# Patient Record
Sex: Female | Born: 1955
Health system: Southern US, Community
[De-identification: ages and names within clinical notes are randomized; demographics above are authoritative.]

## PROBLEM LIST (undated history)

## (undated) DIAGNOSIS — E7800 Pure hypercholesterolemia, unspecified: Secondary | ICD-10-CM

## (undated) DIAGNOSIS — Z923 Personal history of irradiation: Secondary | ICD-10-CM

## (undated) DIAGNOSIS — R112 Nausea with vomiting, unspecified: Secondary | ICD-10-CM

## (undated) DIAGNOSIS — F419 Anxiety disorder, unspecified: Secondary | ICD-10-CM

## (undated) DIAGNOSIS — Z803 Family history of malignant neoplasm of breast: Secondary | ICD-10-CM

## (undated) DIAGNOSIS — Z9889 Other specified postprocedural states: Secondary | ICD-10-CM

## (undated) DIAGNOSIS — C50919 Malignant neoplasm of unspecified site of unspecified female breast: Secondary | ICD-10-CM

## (undated) DIAGNOSIS — Z800 Family history of malignant neoplasm of digestive organs: Secondary | ICD-10-CM

## (undated) DIAGNOSIS — Z789 Other specified health status: Secondary | ICD-10-CM

## (undated) HISTORY — DX: Family history of malignant neoplasm of digestive organs: Z80.0

## (undated) HISTORY — DX: Pure hypercholesterolemia, unspecified: E78.00

## (undated) HISTORY — PX: TONSILLECTOMY: SUR1361

## (undated) HISTORY — DX: Malignant neoplasm of unspecified site of unspecified female breast: C50.919

## (undated) HISTORY — PX: COLONOSCOPY: SHX174

## (undated) HISTORY — DX: Family history of malignant neoplasm of breast: Z80.3

---

## 1999-06-13 ENCOUNTER — Other Ambulatory Visit: Admission: RE | Admit: 1999-06-13 | Discharge: 1999-06-13 | Payer: Self-pay | Admitting: Gynecology

## 2000-06-28 ENCOUNTER — Encounter: Payer: Self-pay | Admitting: Gynecology

## 2000-06-28 ENCOUNTER — Encounter: Admission: RE | Admit: 2000-06-28 | Discharge: 2000-06-28 | Payer: Self-pay | Admitting: Gynecology

## 2000-07-01 ENCOUNTER — Encounter: Admission: RE | Admit: 2000-07-01 | Discharge: 2000-07-01 | Payer: Self-pay | Admitting: Gynecology

## 2000-07-01 ENCOUNTER — Encounter: Payer: Self-pay | Admitting: Gynecology

## 2001-06-30 ENCOUNTER — Other Ambulatory Visit: Admission: RE | Admit: 2001-06-30 | Discharge: 2001-06-30 | Payer: Self-pay | Admitting: Gynecology

## 2001-07-02 ENCOUNTER — Encounter: Payer: Self-pay | Admitting: Gynecology

## 2001-07-02 ENCOUNTER — Encounter: Admission: RE | Admit: 2001-07-02 | Discharge: 2001-07-02 | Payer: Self-pay | Admitting: Gynecology

## 2002-07-02 ENCOUNTER — Other Ambulatory Visit: Admission: RE | Admit: 2002-07-02 | Discharge: 2002-07-02 | Payer: Self-pay | Admitting: Gynecology

## 2002-07-08 ENCOUNTER — Encounter: Payer: Self-pay | Admitting: Gynecology

## 2002-07-08 ENCOUNTER — Encounter: Admission: RE | Admit: 2002-07-08 | Discharge: 2002-07-08 | Payer: Self-pay | Admitting: Gynecology

## 2003-10-11 ENCOUNTER — Other Ambulatory Visit: Admission: RE | Admit: 2003-10-11 | Discharge: 2003-10-11 | Payer: Self-pay | Admitting: Gynecology

## 2004-11-14 ENCOUNTER — Other Ambulatory Visit: Admission: RE | Admit: 2004-11-14 | Discharge: 2004-11-14 | Payer: Self-pay | Admitting: Gynecology

## 2004-12-12 ENCOUNTER — Encounter: Admission: RE | Admit: 2004-12-12 | Discharge: 2004-12-12 | Payer: Self-pay | Admitting: Gynecology

## 2005-12-21 ENCOUNTER — Ambulatory Visit (HOSPITAL_COMMUNITY): Admission: RE | Admit: 2005-12-21 | Discharge: 2005-12-21 | Payer: Self-pay | Admitting: Gynecology

## 2005-12-31 ENCOUNTER — Other Ambulatory Visit: Admission: RE | Admit: 2005-12-31 | Discharge: 2005-12-31 | Payer: Self-pay | Admitting: Gynecology

## 2006-12-31 ENCOUNTER — Ambulatory Visit (HOSPITAL_COMMUNITY): Admission: RE | Admit: 2006-12-31 | Discharge: 2006-12-31 | Payer: Self-pay | Admitting: Gynecology

## 2007-01-06 ENCOUNTER — Other Ambulatory Visit: Admission: RE | Admit: 2007-01-06 | Discharge: 2007-01-06 | Payer: Self-pay | Admitting: Gynecology

## 2008-04-15 ENCOUNTER — Ambulatory Visit (HOSPITAL_COMMUNITY): Admission: RE | Admit: 2008-04-15 | Discharge: 2008-04-15 | Payer: Self-pay | Admitting: Gynecology

## 2009-05-05 ENCOUNTER — Ambulatory Visit (HOSPITAL_COMMUNITY): Admission: RE | Admit: 2009-05-05 | Discharge: 2009-05-05 | Payer: Self-pay | Admitting: Gynecology

## 2010-05-29 ENCOUNTER — Ambulatory Visit (HOSPITAL_COMMUNITY): Admission: RE | Admit: 2010-05-29 | Discharge: 2010-05-29 | Payer: Self-pay | Admitting: Gynecology

## 2011-01-14 ENCOUNTER — Encounter: Payer: Self-pay | Admitting: Gynecology

## 2011-08-16 ENCOUNTER — Other Ambulatory Visit (HOSPITAL_COMMUNITY): Payer: Self-pay | Admitting: Gynecology

## 2011-08-16 DIAGNOSIS — Z1231 Encounter for screening mammogram for malignant neoplasm of breast: Secondary | ICD-10-CM

## 2011-08-28 ENCOUNTER — Ambulatory Visit (HOSPITAL_COMMUNITY)
Admission: RE | Admit: 2011-08-28 | Discharge: 2011-08-28 | Disposition: A | Discharge status: Home / Self Care | Payer: 59 | Source: Ambulatory Visit | Attending: Gynecology | Admitting: Gynecology

## 2011-08-28 DIAGNOSIS — Z1231 Encounter for screening mammogram for malignant neoplasm of breast: Secondary | ICD-10-CM

## 2012-11-19 ENCOUNTER — Other Ambulatory Visit (HOSPITAL_COMMUNITY): Payer: Self-pay | Admitting: Gynecology

## 2012-11-19 DIAGNOSIS — Z1231 Encounter for screening mammogram for malignant neoplasm of breast: Secondary | ICD-10-CM

## 2012-11-24 ENCOUNTER — Ambulatory Visit (HOSPITAL_COMMUNITY)
Admission: RE | Admit: 2012-11-24 | Discharge: 2012-11-24 | Disposition: A | Discharge status: Home / Self Care | Payer: 59 | Source: Ambulatory Visit | Attending: Gynecology | Admitting: Gynecology

## 2012-11-24 DIAGNOSIS — Z1231 Encounter for screening mammogram for malignant neoplasm of breast: Secondary | ICD-10-CM | POA: Insufficient documentation

## 2012-11-28 ENCOUNTER — Other Ambulatory Visit: Payer: Self-pay | Admitting: Gynecology

## 2012-11-28 DIAGNOSIS — R928 Other abnormal and inconclusive findings on diagnostic imaging of breast: Secondary | ICD-10-CM

## 2012-12-15 ENCOUNTER — Ambulatory Visit
Admission: RE | Admit: 2012-12-15 | Discharge: 2012-12-15 | Disposition: A | Discharge status: Home / Self Care | Payer: 59 | Source: Ambulatory Visit | Attending: Gynecology | Admitting: Gynecology

## 2012-12-15 ENCOUNTER — Other Ambulatory Visit: Payer: Self-pay | Admitting: Gynecology

## 2012-12-15 DIAGNOSIS — R928 Other abnormal and inconclusive findings on diagnostic imaging of breast: Secondary | ICD-10-CM

## 2013-08-10 ENCOUNTER — Other Ambulatory Visit (HOSPITAL_COMMUNITY): Payer: 59

## 2013-08-11 ENCOUNTER — Encounter (HOSPITAL_COMMUNITY)
Admission: RE | Admit: 2013-08-11 | Discharge: 2013-08-11 | Disposition: A | Discharge status: Home / Self Care | Payer: BC Managed Care – PPO | Source: Ambulatory Visit | Attending: Obstetrics and Gynecology | Admitting: Obstetrics and Gynecology

## 2013-08-11 ENCOUNTER — Encounter (HOSPITAL_COMMUNITY): Payer: Self-pay

## 2013-08-11 HISTORY — DX: Other specified health status: Z78.9

## 2013-08-11 LAB — CBC
HCT: 39.8 % (ref 36.0–46.0)
Hemoglobin: 13.9 g/dL (ref 12.0–15.0)
MCHC: 34.9 g/dL (ref 30.0–36.0)
MCV: 90 fL (ref 78.0–100.0)
WBC: 8.8 10*3/uL (ref 4.0–10.5)

## 2013-08-11 NOTE — Patient Instructions (Signed)
Your procedure is scheduled on:08/14/13  Enter through the Main Entrance at :6am  Pick up desk phone and dial [phone] and inform us of your arrival.  Please call [phone] if you have any problems the morning of surgery.  Remember: Do not eat food or drink liquids, including water, after midnight:Thursday   You may brush your teeth the morning of surgery.   DO NOT wear jewelry, eye make-up, lipstick,body lotion, or dark fingernail polish.  (Polished toes are ok) You may wear deodorant.  If you are to be admitted after surgery, leave suitcase in car until your room has been assigned. Patients discharged on the day of surgery will not be allowed to drive home. Wear loose fitting, comfortable clothes for your ride home.

## 2013-08-13 NOTE — H&P (Signed)
Judith Mcneil is an 57 y.o. female who presents for a scheduled hysteroscopic resection of endometrial polyps noted on a recent w/u for postmenopausal bleeding.  The pt had been several years amenorrheic on a regimen of prometrium and climara with estring when she had both an episode of spotting and a full period-like bleed .  EMB was negative but a SIUS showed two polyps measuring 2.5cm and 1 cm.    Pertinent Gynecological History: Menstrual History:  No LMP recorded. Patient is postmenopausal.    Past Medical History  Diagnosis Date  . Medical history non-contributory     Past Surgical History  Procedure Laterality Date  . Tonsillectomy      age 66  . Colonoscopy      x 2    No family history on file.  Social History:  reports that she has never smoked. She does not have any smokeless tobacco history on file. She reports that she drinks about 0.6 ounces of alcohol per week. She reports that she does not use illicit drugs.  Allergies: No Known Allergies  No prescriptions prior to admission    ROS  There were no vitals taken for this visit. Physical Exam  Constitutional: She is oriented to person, place, and time. She appears well-developed and well-nourished.  Cardiovascular: Normal rate and regular rhythm.   Respiratory: Breath sounds normal.  GI: Soft.  Genitourinary: Vagina normal and uterus normal.  Neurological: She is alert and oriented to person, place, and time.  Psychiatric: She has a normal mood and affect.     Assessment/Plan: The pt was counseled on risks and benefits of the procedure including bleeding and possible uterine perforation.  She will pretreat with cytotec 3 hours prior to surgery to aid with cervical dilation.  We reviewed the use of truclear in detail and she is ready to proceed.  Oliver Pila 08/13/2013, 6:16 PM

## 2013-08-14 ENCOUNTER — Encounter (HOSPITAL_COMMUNITY)
Admission: RE | Disposition: A | Discharge status: Home / Self Care | Payer: Self-pay | Source: Ambulatory Visit | Attending: Obstetrics and Gynecology

## 2013-08-14 ENCOUNTER — Ambulatory Visit (HOSPITAL_COMMUNITY): Payer: BC Managed Care – PPO | Admitting: Certified Registered"

## 2013-08-14 ENCOUNTER — Encounter (HOSPITAL_COMMUNITY): Payer: Self-pay | Admitting: Certified Registered"

## 2013-08-14 ENCOUNTER — Encounter (HOSPITAL_COMMUNITY): Payer: Self-pay | Admitting: *Deleted

## 2013-08-14 ENCOUNTER — Ambulatory Visit (HOSPITAL_COMMUNITY)
Admission: RE | Admit: 2013-08-14 | Discharge: 2013-08-14 | Disposition: A | Discharge status: Home / Self Care | Payer: BC Managed Care – PPO | Source: Ambulatory Visit | Attending: Obstetrics and Gynecology | Admitting: Obstetrics and Gynecology

## 2013-08-14 DIAGNOSIS — N950 Postmenopausal bleeding: Secondary | ICD-10-CM | POA: Insufficient documentation

## 2013-08-14 DIAGNOSIS — N840 Polyp of corpus uteri: Secondary | ICD-10-CM | POA: Insufficient documentation

## 2013-08-14 HISTORY — PX: DILATATION & CURETTAGE/HYSTEROSCOPY WITH TRUECLEAR: SHX6353

## 2013-08-14 SURGERY — DILATATION & CURETTAGE/HYSTEROSCOPY WITH TRUCLEAR
Anesthesia: General | Site: Vagina | Wound class: Clean Contaminated

## 2013-08-14 MED ORDER — MEPERIDINE HCL 25 MG/ML IJ SOLN: 6.2500 mg | INTRAMUSCULAR | Status: DC | PRN

## 2013-08-14 MED ORDER — PROPOFOL 10 MG/ML IV BOLUS
INTRAVENOUS | Status: DC | PRN
  Administered 2013-08-14: 150 mg via INTRAVENOUS

## 2013-08-14 MED ORDER — LIDOCAINE HCL (CARDIAC) 20 MG/ML IV SOLN
INTRAVENOUS | Status: DC | PRN
  Administered 2013-08-14: 80 mg via INTRAVENOUS

## 2013-08-14 MED ORDER — FENTANYL CITRATE 0.05 MG/ML IJ SOLN
INTRAMUSCULAR | Status: AC
  Filled 2013-08-14: qty 5

## 2013-08-14 MED ORDER — MIDAZOLAM HCL 5 MG/5ML IJ SOLN
INTRAMUSCULAR | Status: DC | PRN
  Administered 2013-08-14: 2 mg via INTRAVENOUS

## 2013-08-14 MED ORDER — KETOROLAC TROMETHAMINE 30 MG/ML IJ SOLN
INTRAMUSCULAR | Status: DC | PRN
  Administered 2013-08-14: 30 mg via INTRAVENOUS

## 2013-08-14 MED ORDER — LACTATED RINGERS IV SOLN
INTRAVENOUS | Status: DC
  Administered 2013-08-14 (×2): via INTRAVENOUS

## 2013-08-14 MED ORDER — ONDANSETRON HCL 4 MG/2ML IJ SOLN
INTRAMUSCULAR | Status: DC | PRN
  Administered 2013-08-14: 4 mg via INTRAVENOUS

## 2013-08-14 MED ORDER — SODIUM CHLORIDE 0.9 % IR SOLN
Status: DC | PRN
  Administered 2013-08-14: 6000 mL

## 2013-08-14 MED ORDER — LIDOCAINE HCL 1 % IJ SOLN
INTRAMUSCULAR | Status: DC | PRN
  Administered 2013-08-14: 20 mL

## 2013-08-14 MED ORDER — LIDOCAINE HCL (CARDIAC) 20 MG/ML IV SOLN
INTRAVENOUS | Status: AC
Start: 2013-08-14 — End: 2013-08-14
  Filled 2013-08-14: qty 5

## 2013-08-14 MED ORDER — PROPOFOL 10 MG/ML IV EMUL
INTRAVENOUS | Status: AC
  Filled 2013-08-14: qty 20

## 2013-08-14 MED ORDER — MIDAZOLAM HCL 2 MG/2ML IJ SOLN: 0.5000 mg | Freq: Once | INTRAMUSCULAR | Status: DC | PRN

## 2013-08-14 MED ORDER — KETOROLAC TROMETHAMINE 30 MG/ML IJ SOLN: 15.0000 mg | Freq: Once | INTRAMUSCULAR | Status: DC | PRN

## 2013-08-14 MED ORDER — LACTATED RINGERS IV SOLN: INTRAVENOUS | Status: DC

## 2013-08-14 MED ORDER — DEXAMETHASONE SODIUM PHOSPHATE 10 MG/ML IJ SOLN
INTRAMUSCULAR | Status: AC
  Filled 2013-08-14: qty 1

## 2013-08-14 MED ORDER — PROMETHAZINE HCL 25 MG/ML IJ SOLN: 6.2500 mg | INTRAMUSCULAR | Status: DC | PRN

## 2013-08-14 MED ORDER — ONDANSETRON HCL 4 MG/2ML IJ SOLN
INTRAMUSCULAR | Status: AC
  Filled 2013-08-14: qty 2

## 2013-08-14 MED ORDER — MIDAZOLAM HCL 2 MG/2ML IJ SOLN
INTRAMUSCULAR | Status: AC
  Filled 2013-08-14: qty 2

## 2013-08-14 MED ORDER — FENTANYL CITRATE 0.05 MG/ML IJ SOLN: 25.0000 ug | INTRAMUSCULAR | Status: DC | PRN

## 2013-08-14 MED ORDER — FENTANYL CITRATE 0.05 MG/ML IJ SOLN
INTRAMUSCULAR | Status: DC | PRN
  Administered 2013-08-14 (×2): 50 ug via INTRAVENOUS

## 2013-08-14 MED ORDER — DEXAMETHASONE SODIUM PHOSPHATE 10 MG/ML IJ SOLN
INTRAMUSCULAR | Status: DC | PRN
  Administered 2013-08-14: 10 mg via INTRAVENOUS

## 2013-08-14 SURGICAL SUPPLY — 23 items
BLADE INCISOR TRUC PLUS 2.9 (ABLATOR) IMPLANT
CANISTERS HI-FLOW 3000CC (CANNISTER) ×3 IMPLANT
CATH ROBINSON RED A/P 16FR (CATHETERS) ×2 IMPLANT
CLOTH BEACON ORANGE TIMEOUT ST (SAFETY) ×2 IMPLANT
CONTAINER PREFILL 10% NBF 60ML (FORM) ×4 IMPLANT
DRAPE HYSTEROSCOPY (DRAPE) ×2 IMPLANT
DRESSING TELFA 8X3 (GAUZE/BANDAGES/DRESSINGS) ×2 IMPLANT
ELECT REM PT RETURN 9FT ADLT (ELECTROSURGICAL)
ELECTRODE REM PT RTRN 9FT ADLT (ELECTROSURGICAL) IMPLANT
GLOVE BIO SURGEON STRL SZ 6.5 (GLOVE) ×2 IMPLANT
GLOVE BIOGEL PI IND STRL 6.5 (GLOVE) ×1 IMPLANT
GLOVE BIOGEL PI INDICATOR 6.5 (GLOVE) ×1
GOWN STRL REIN XL XLG (GOWN DISPOSABLE) ×4 IMPLANT
INCISOR TRUC PLUS BLADE 2.9 (ABLATOR) ×2
KIT HYSTEROSCOPY TRUCLEAR (ABLATOR) ×1 IMPLANT
MORCELLATOR RECIP TRUCLEAR 4.0 (ABLATOR) IMPLANT
NDL SPNL 22GX3.5 QUINCKE BK (NEEDLE) ×1 IMPLANT
NEEDLE SPNL 22GX3.5 QUINCKE BK (NEEDLE) ×2 IMPLANT
PACK VAGINAL MINOR WOMEN LF (CUSTOM PROCEDURE TRAY) ×2 IMPLANT
PAD OB MATERNITY 4.3X12.25 (PERSONAL CARE ITEMS) ×2 IMPLANT
SYR CONTROL 10ML LL (SYRINGE) ×2 IMPLANT
TOWEL OR 17X24 6PK STRL BLUE (TOWEL DISPOSABLE) ×4 IMPLANT
WATER STERILE IRR 1000ML POUR (IV SOLUTION) ×2 IMPLANT

## 2013-08-14 NOTE — Anesthesia Postprocedure Evaluation (Signed)
Anesthesia Post Note  Patient: Judith Mcneil  Procedure(s) Performed: Procedure(s) (LRB): DILATATION & CURETTAGE/HYSTEROSCOPY WITH TRUECLEAR (N/A)  Anesthesia type: General  Patient location: PACU  Post pain: Pain level controlled  Post assessment: Post-op Vital signs reviewed  Last Vitals:  Filed Vitals:   08/14/13 0858  BP: 122/74  Pulse: 91  Temp: 36.4 C  Resp:     Post vital signs: Reviewed  Level of consciousness: sedated  Complications: No apparent anesthesia complications

## 2013-08-14 NOTE — Brief Op Note (Signed)
08/14/2013  8:23 AM  PATIENT:  Judith Mcneil  57 y.o. female  PRE-OPERATIVE DIAGNOSIS:  postmenopausal bleeding, [address]  POST-OPERATIVE DIAGNOSIS:  postmenopausal bleeding  PROCEDURE:  Procedure(s) with comments: DILATATION & CURETTAGE/HYSTEROSCOPY WITH TRUECLEAR (N/A) - 1 hr in the OR  SURGEON:  Surgeon(s) and Role:    * Oliver Pila, MD - Primary  PHYSICIAN ASSISTANT:   ASSISTANTS: none   ANESTHESIA:   local, MAC  EBL:  Total I/O In: 1000 [I.V.:1000] Out: -   BLOOD ADMINISTERED:none  DRAINS: none   LOCAL MEDICATIONS USED:  LIDOCAINE   SPECIMEN:  Endometrial currettings  DISPOSITION OF SPECIMEN:  PATHOLOGY  COUNTS:  YES  TOURNIQUET:  * No tourniquets in log *  DICTATION: .Dragon Dictation  PLAN OF CARE: Discharge to home after PACU  PATIENT DISPOSITION:  PACU - hemodynamically stable.

## 2013-08-14 NOTE — Preoperative (Signed)
Beta Blockers   Reason not to administer Beta Blockers:Not Applicable 

## 2013-08-14 NOTE — Progress Notes (Signed)
Patient ID: Judith Mcneil, female   DOB: 04-14-1956, 57 y.o.   MRN: [id number] Per pt no changes in dictated H&P.  Brief exam WNL.

## 2013-08-14 NOTE — Anesthesia Preprocedure Evaluation (Signed)
Anesthesia Evaluation  Patient identified by MRN, date of birth, ID band Patient awake    Reviewed: Allergy & Precautions, H&P , Patient's Chart, lab work & pertinent test results, reviewed documented beta blocker date and time   History of Anesthesia Complications Negative for: history of anesthetic complications  Airway Mallampati: II TM Distance: >3 FB Neck ROM: full    Dental no notable dental hx.    Pulmonary neg pulmonary ROS,  breath sounds clear to auscultation  Pulmonary exam normal       Cardiovascular Exercise Tolerance: Good negative cardio ROS  Rhythm:regular Rate:Normal     Neuro/Psych negative neurological ROS  negative psych ROS   GI/Hepatic negative GI ROS, Neg liver ROS,   Endo/Other  negative endocrine ROS  Renal/GU negative Renal ROS     Musculoskeletal   Abdominal   Peds  Hematology negative hematology ROS (+)   Anesthesia Other Findings   Reproductive/Obstetrics negative OB ROS                           Anesthesia Physical Anesthesia Plan  ASA: II  Anesthesia Plan: General LMA   Post-op Pain Management:    Induction:   Airway Management Planned:   Additional Equipment:   Intra-op Plan:   Post-operative Plan:   Informed Consent: I have reviewed the patients History and Physical, chart, labs and discussed the procedure including the risks, benefits and alternatives for the proposed anesthesia with the patient or authorized representative who has indicated his/her understanding and acceptance.   Dental Advisory Given  Plan Discussed with: CRNA, Surgeon and Anesthesiologist  Anesthesia Plan Comments:         Anesthesia Quick Evaluation

## 2013-08-14 NOTE — Transfer of Care (Signed)
Immediate Anesthesia Transfer of Care Note  Patient: Judith Mcneil  Procedure(s) Performed: Procedure(s) with comments: DILATATION & CURETTAGE/HYSTEROSCOPY WITH TRUECLEAR (N/A) - 1 hr in the OR  Patient Location: PACU  Anesthesia Type:General  Level of Consciousness: awake, alert  and oriented  Airway & Oxygen Therapy: Patient Spontanous Breathing and Patient connected to nasal cannula oxygen  Post-op Assessment: Report given to PACU RN, Post -op Vital signs reviewed and stable and Patient moving all extremities  Post vital signs: Reviewed and stable  Complications: No apparent anesthesia complications

## 2013-08-14 NOTE — Op Note (Signed)
Operative note  Preop diagnosis Postmenopausal bleeding Endometrial polyps  Postoperative diagnosis Same  Procedure Operative hysteroscopy with resection of endometrial polyps using the truclear  Surgeon Dr. Huel Cote  Anesthesia LMA and 1% paracervical block  Fluids Estimated blood loss less than 50 cc Urine output 50 cc cc straight catheter prior procedure IV fluids 1200 cc LR Hysteroscopic deficit 100 cc  Findings  The uterine cavity had 2 polyps a large 2 cm polyp extending off the posterior surface of the uterus as well as a smaller polyp in the fundal region measuring approximately 1 cm. The remainder of the cavity appeared normal.   Specimen  Endometrial polyp and curettings   Procedure  After informed consent was obtained patient was taken to the operating room where LMA anesthesia was obtained without difficulty. She was prepped and draped in the normal sterile fashion in the dorsal lithotomy position. An appropriate time out was performed. A speculum was placed within the vagina and the cervix was identified and the anterior lip of the cervix was injected with 1% lidocaine. This was then grasped with a single-tooth tenaculum and an additional 9 cc each was placed in the 2 and 10:00 position of the paracervical tissue. The cervix was easily sounded and dilated do to the patient's Cytotec use. The truclear smaller operating scope was then easily introduced into the uterine fundus and the findings as previously stated. The truclear blade was then inserted through the scope and the polyps removed under direct visualization and the tissue handed off to pathology. At this point the smaller scope was replaced with a larger true clear scope to obtain better uterine distention and all areas were closely inspected with no residual polyp noted.   At the conclusion of the procedure there were no further polyps in the uterine fundus however there did appear to be some lining  present so a gentle curettage was performed and this was likewise handed off to pathology. There is no active bleeding noted and all entrance cameras were removed from the vagina and the tenaculum removed from the cervix. The patient was then awakened and taken to the recovery room in good condition. All instruments and sponge counts were correct.

## 2013-08-17 ENCOUNTER — Encounter (HOSPITAL_COMMUNITY): Payer: Self-pay | Admitting: Obstetrics and Gynecology

## 2013-12-01 ENCOUNTER — Other Ambulatory Visit (HOSPITAL_COMMUNITY): Payer: Self-pay | Admitting: Obstetrics and Gynecology

## 2013-12-01 DIAGNOSIS — Z1231 Encounter for screening mammogram for malignant neoplasm of breast: Secondary | ICD-10-CM

## 2013-12-22 ENCOUNTER — Ambulatory Visit (HOSPITAL_COMMUNITY): Payer: BC Managed Care – PPO

## 2013-12-31 ENCOUNTER — Other Ambulatory Visit (HOSPITAL_COMMUNITY): Payer: Self-pay | Admitting: Obstetrics and Gynecology

## 2013-12-31 ENCOUNTER — Ambulatory Visit (HOSPITAL_COMMUNITY)
Admission: RE | Admit: 2013-12-31 | Discharge: 2013-12-31 | Disposition: A | Discharge status: Home / Self Care | Payer: BC Managed Care – PPO | Source: Ambulatory Visit | Attending: Obstetrics and Gynecology | Admitting: Obstetrics and Gynecology

## 2013-12-31 DIAGNOSIS — Z1231 Encounter for screening mammogram for malignant neoplasm of breast: Secondary | ICD-10-CM

## 2014-02-17 ENCOUNTER — Encounter (HOSPITAL_COMMUNITY): Payer: Self-pay | Admitting: Emergency Medicine

## 2014-02-17 ENCOUNTER — Emergency Department (HOSPITAL_COMMUNITY)
Admission: EM | Admit: 2014-02-17 | Discharge: 2014-02-17 | Disposition: A | Discharge status: Home / Self Care | Payer: Worker's Compensation | Attending: Emergency Medicine | Admitting: Emergency Medicine

## 2014-02-17 ENCOUNTER — Emergency Department (HOSPITAL_COMMUNITY): Payer: Worker's Compensation

## 2014-02-17 DIAGNOSIS — T148XXA Other injury of unspecified body region, initial encounter: Secondary | ICD-10-CM

## 2014-02-17 DIAGNOSIS — S6990XA Unspecified injury of unspecified wrist, hand and finger(s), initial encounter: Secondary | ICD-10-CM | POA: Insufficient documentation

## 2014-02-17 DIAGNOSIS — S59919A Unspecified injury of unspecified forearm, initial encounter: Secondary | ICD-10-CM | POA: Insufficient documentation

## 2014-02-17 DIAGNOSIS — S199XXA Unspecified injury of neck, initial encounter: Secondary | ICD-10-CM

## 2014-02-17 DIAGNOSIS — Z79899 Other long term (current) drug therapy: Secondary | ICD-10-CM | POA: Insufficient documentation

## 2014-02-17 DIAGNOSIS — IMO0002 Reserved for concepts with insufficient information to code with codable children: Secondary | ICD-10-CM | POA: Insufficient documentation

## 2014-02-17 DIAGNOSIS — S62609A Fracture of unspecified phalanx of unspecified finger, initial encounter for closed fracture: Secondary | ICD-10-CM

## 2014-02-17 DIAGNOSIS — S62639A Displaced fracture of distal phalanx of unspecified finger, initial encounter for closed fracture: Secondary | ICD-10-CM | POA: Insufficient documentation

## 2014-02-17 DIAGNOSIS — S59909A Unspecified injury of unspecified elbow, initial encounter: Secondary | ICD-10-CM | POA: Insufficient documentation

## 2014-02-17 DIAGNOSIS — Y9389 Activity, other specified: Secondary | ICD-10-CM | POA: Insufficient documentation

## 2014-02-17 DIAGNOSIS — S0993XA Unspecified injury of face, initial encounter: Secondary | ICD-10-CM | POA: Insufficient documentation

## 2014-02-17 DIAGNOSIS — Y92410 Unspecified street and highway as the place of occurrence of the external cause: Secondary | ICD-10-CM | POA: Insufficient documentation

## 2014-02-17 MED ORDER — OXYCODONE-ACETAMINOPHEN 5-325 MG PO TABS: 2.0000 | ORAL_TABLET | Freq: Four times a day (QID) | ORAL | Status: DC | PRN
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
## 2014-02-17 NOTE — ED Provider Notes (Signed)
Medical screening examination/treatment/procedure(s) were performed by non-physician practitioner and as supervising physician I was immediately available for consultation/collaboration.  EKG Interpretation   None         Houston Siren III, MD 02/17/14 [id number]
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
## 2017-12-24 HISTORY — PX: BREAST LUMPECTOMY: SHX2

## 2018-06-11 ENCOUNTER — Other Ambulatory Visit: Payer: Self-pay | Admitting: Obstetrics and Gynecology

## 2018-06-11 DIAGNOSIS — R928 Other abnormal and inconclusive findings on diagnostic imaging of breast: Secondary | ICD-10-CM

## 2018-06-16 ENCOUNTER — Ambulatory Visit
Admission: RE | Admit: 2018-06-16 | Discharge: 2018-06-16 | Disposition: A | Discharge status: Home / Self Care | Payer: BLUE CROSS/BLUE SHIELD | Source: Ambulatory Visit | Attending: Obstetrics and Gynecology | Admitting: Obstetrics and Gynecology

## 2018-06-16 ENCOUNTER — Other Ambulatory Visit: Payer: Self-pay | Admitting: Obstetrics and Gynecology

## 2018-06-16 DIAGNOSIS — R928 Other abnormal and inconclusive findings on diagnostic imaging of breast: Secondary | ICD-10-CM

## 2018-06-16 DIAGNOSIS — N6489 Other specified disorders of breast: Secondary | ICD-10-CM

## 2018-06-19 ENCOUNTER — Other Ambulatory Visit: Payer: Self-pay | Admitting: Obstetrics and Gynecology

## 2018-06-19 ENCOUNTER — Ambulatory Visit
Admission: RE | Admit: 2018-06-19 | Discharge: 2018-06-19 | Disposition: A | Discharge status: Home / Self Care | Payer: BLUE CROSS/BLUE SHIELD | Source: Ambulatory Visit | Attending: Obstetrics and Gynecology | Admitting: Obstetrics and Gynecology

## 2018-06-19 DIAGNOSIS — N6489 Other specified disorders of breast: Secondary | ICD-10-CM

## 2018-06-20 ENCOUNTER — Telehealth: Payer: Self-pay | Admitting: Hematology and Oncology

## 2018-06-20 ENCOUNTER — Encounter: Payer: Self-pay | Admitting: *Deleted

## 2018-06-20 NOTE — Telephone Encounter (Signed)
Spoke with patient to confirm afternoon appointment for 7/3, packet emailed to patient

## 2018-06-24 ENCOUNTER — Other Ambulatory Visit: Payer: Self-pay | Admitting: *Deleted

## 2018-06-24 DIAGNOSIS — Z170 Estrogen receptor positive status [ER+]: Secondary | ICD-10-CM | POA: Insufficient documentation

## 2018-06-24 DIAGNOSIS — C50412 Malignant neoplasm of upper-outer quadrant of left female breast: Secondary | ICD-10-CM

## 2018-06-25 ENCOUNTER — Encounter: Payer: Self-pay | Admitting: Physical Therapy

## 2018-06-25 ENCOUNTER — Inpatient Hospital Stay: Payer: BLUE CROSS/BLUE SHIELD | Attending: Hematology and Oncology | Admitting: Hematology and Oncology

## 2018-06-25 ENCOUNTER — Ambulatory Visit
Admission: RE | Admit: 2018-06-25 | Discharge: 2018-06-25 | Disposition: A | Discharge status: Home / Self Care | Payer: BLUE CROSS/BLUE SHIELD | Source: Ambulatory Visit | Attending: Radiation Oncology | Admitting: Radiation Oncology

## 2018-06-25 ENCOUNTER — Ambulatory Visit: Payer: Self-pay | Admitting: General Surgery

## 2018-06-25 ENCOUNTER — Ambulatory Visit: Payer: BLUE CROSS/BLUE SHIELD | Attending: General Surgery | Admitting: Physical Therapy

## 2018-06-25 ENCOUNTER — Other Ambulatory Visit: Payer: Self-pay | Admitting: *Deleted

## 2018-06-25 ENCOUNTER — Other Ambulatory Visit: Payer: Self-pay

## 2018-06-25 ENCOUNTER — Encounter: Payer: Self-pay | Admitting: Hematology and Oncology

## 2018-06-25 ENCOUNTER — Inpatient Hospital Stay: Payer: BLUE CROSS/BLUE SHIELD

## 2018-06-25 DIAGNOSIS — Z79899 Other long term (current) drug therapy: Secondary | ICD-10-CM | POA: Insufficient documentation

## 2018-06-25 DIAGNOSIS — Z803 Family history of malignant neoplasm of breast: Secondary | ICD-10-CM | POA: Diagnosis not present

## 2018-06-25 DIAGNOSIS — Z170 Estrogen receptor positive status [ER+]: Secondary | ICD-10-CM | POA: Insufficient documentation

## 2018-06-25 DIAGNOSIS — R293 Abnormal posture: Secondary | ICD-10-CM | POA: Insufficient documentation

## 2018-06-25 DIAGNOSIS — Z800 Family history of malignant neoplasm of digestive organs: Secondary | ICD-10-CM | POA: Diagnosis not present

## 2018-06-25 DIAGNOSIS — C50412 Malignant neoplasm of upper-outer quadrant of left female breast: Secondary | ICD-10-CM

## 2018-06-25 DIAGNOSIS — C50912 Malignant neoplasm of unspecified site of left female breast: Secondary | ICD-10-CM

## 2018-06-25 LAB — CBC WITH DIFFERENTIAL (CANCER CENTER ONLY)
Basophils Absolute: 0 10*3/uL (ref 0.0–0.1)
Basophils Relative: 1 %
Eosinophils Absolute: 0.1 10*3/uL (ref 0.0–0.5)
Eosinophils Relative: 1 %
HEMATOCRIT: 41.7 % (ref 34.8–46.6)
Hemoglobin: 13.9 g/dL (ref 11.6–15.9)
LYMPHS ABS: 1.9 10*3/uL (ref 0.9–3.3)
Lymphocytes Relative: 28 %
MCH: 30.9 pg (ref 25.1–34.0)
MCHC: 33.3 g/dL (ref 31.5–36.0)
MCV: 92.8 fL (ref 79.5–101.0)
Monocytes Absolute: 0.4 10*3/uL (ref 0.1–0.9)
Monocytes Relative: 6 %
NEUTROS PCT: 64 %
Neutro Abs: 4.4 10*3/uL (ref 1.5–6.5)
Platelet Count: 262 10*3/uL (ref 145–400)
RBC: 4.49 MIL/uL (ref 3.70–5.45)
RDW: 13.5 % (ref 11.2–14.5)
WBC: 6.9 10*3/uL (ref 3.9–10.3)

## 2018-06-25 LAB — CMP (CANCER CENTER ONLY)
ALT: 25 U/L (ref 0–44)
AST: 19 U/L (ref 15–41)
Albumin: 3.9 g/dL (ref 3.5–5.0)
Alkaline Phosphatase: 90 U/L (ref 38–126)
Anion gap: 5 (ref 5–15)
BUN: 20 mg/dL (ref 8–23)
CALCIUM: 9.2 mg/dL (ref 8.9–10.3)
CHLORIDE: 108 mmol/L (ref 98–111)
CO2: 26 mmol/L (ref 22–32)
Creatinine: 0.95 mg/dL (ref 0.44–1.00)
GFR, Estimated: >60 mL/min (ref >60)
Glucose, Bld: 94 mg/dL (ref 70–99)
Potassium: 4.4 mmol/L (ref 3.5–5.1)
Sodium: 139 mmol/L (ref 135–145)
Total Bilirubin: 0.5 mg/dL (ref 0.3–1.2)
Total Protein: 7 g/dL (ref 6.5–8.1)

## 2018-06-25 NOTE — Progress Notes (Signed)
[facility] CONSULT NOTE  Patient Care Team: Patient, No Pcp Per as PCP - General (General Practice) Hoxworth, Benjamin, MD as Consulting Physician (General Surgery) Gudena, Vinay, MD as Consulting Physician (Hematology and Oncology) Moody, John, MD as Consulting Physician (Radiation Oncology)  CHIEF COMPLAINTS/PURPOSE OF CONSULTATION:  Newly diagnosed breast cancer  HISTORY OF PRESENTING ILLNESS:  Judith Mcneil 62 y.o. female is here because of recent diagnosis of left breast cancer.  Patient had a routine screening mammogram that detected a left breast mass and distortion.  On further evaluation the mass went away but the distortion persisted.  However he did not have an ultrasound correlate so stereotactic biopsy was performed which revealed grade 2 invasive ductal carcinoma that is ER PR positive HER-2 negative with a Ki-67 of 3%.  She was presented this morning at the multidisciplinary tumor board and she is here today to discuss her treatment plan.  She is accompanied by her daughter.  I reviewed her records extensively and collaborated the history with the patient.  SUMMARY OF ONCOLOGIC HISTORY:   Malignant neoplasm of upper-outer quadrant of left breast in female, estrogen receptor positive (HCC)   06/19/2018 Initial Diagnosis    Screening detected left breast mass and distortion on further evaluation of mass went away and the distortion did not have a sonographic correlate.  Stereotactic biopsy revealed grade 2 IDC ER 95%, PR 90%, Ki-67 3%, HER-2 negative ratio 1.09 along with LCIS TX N0 stage Ia clinical stage       MEDICAL HISTORY:  Past Medical History:  Diagnosis Date  . Medical history non-contributory     SURGICAL HISTORY: Past Surgical History:  Procedure Laterality Date  . COLONOSCOPY     x 2  . DILATATION & CURETTAGE/HYSTEROSCOPY WITH TRUECLEAR N/A 08/14/2013   Procedure: DILATATION & CURETTAGE/HYSTEROSCOPY WITH TRUECLEAR;  Surgeon: Kathy W  Richardson, MD;  Location: [facility];  Service: Gynecology;  Laterality: N/A;  1 hr in the OR  . TONSILLECTOMY     age 7    SOCIAL HISTORY: Social History   Socioeconomic History  . Marital status: Widowed    Spouse name: Not on file  . Number of children: Not on file  . Years of education: Not on file  . Highest education level: Not on file  Occupational History  . Not on file  Social Needs  . Financial resource strain: Not on file  . Food insecurity:    Worry: Not on file    Inability: Not on file  . Transportation needs:    Medical: Not on file    Non-medical: Not on file  Tobacco Use  . Smoking status: Never Smoker  . Smokeless tobacco: Never Used  Substance and Sexual Activity  . Alcohol use: Yes    Alcohol/week: 0.6 oz    Types: 1 Glasses of wine per week    Comment: few times a week  . Drug use: No  . Sexual activity: Not on file  Lifestyle  . Physical activity:    Days per week: Not on file    Minutes per session: Not on file  . Stress: Not on file  Relationships  . Social connections:    Talks on phone: Not on file    Gets together: Not on file    Attends religious service: Not on file    Active member of club or organization: Not on file    Attends meetings of clubs or organizations: Not on file      Relationship status: Not on file  . Intimate partner violence:    Fear of current or ex partner: Not on file    Emotionally abused: Not on file    Physically abused: Not on file    Forced sexual activity: Not on file  Other Topics Concern  . Not on file  Social History Narrative  . Not on file    FAMILY HISTORY: Family History  Problem Relation Age of Onset  . Colon cancer Father   . Breast cancer Paternal Grandmother     ALLERGIES:  has No Known Allergies.  MEDICATIONS:  Current Outpatient Medications  Medication Sig Dispense Refill  . ALPRAZolam (XANAX) 0.25 MG tablet Take 0.25 mg by mouth at bedtime as needed for anxiety.    . Fish Oil OIL  Take 1 capsule by mouth daily.    Marland Kitchen FLUoxetine (PROZAC) 10 MG capsule Take 10 mg by mouth daily.    Marland Kitchen ibuprofen (ADVIL,MOTRIN) 200 MG tablet Take 400 mg by mouth every 6 (six) hours as needed.    . loratadine (CLARITIN) 10 MG tablet Take 10 mg by mouth daily.    . valACYclovir (VALTREX) 1000 MG tablet Take 1,000 mg by mouth daily as needed (outbreak).     No current facility-administered medications for this visit.     REVIEW OF SYSTEMS:   Constitutional: Denies fevers, chills or abnormal night sweats Eyes: Denies blurriness of vision, double vision or watery eyes Ears, nose, mouth, throat, and face: Denies mucositis or sore throat Respiratory: Denies cough, dyspnea or wheezes Cardiovascular: Denies palpitation, chest discomfort or lower extremity swelling Gastrointestinal:  Denies nausea, heartburn or change in bowel habits Skin: Denies abnormal skin rashes Lymphatics: Denies new lymphadenopathy or easy bruising Neurological:Denies numbness, tingling or new weaknesses Behavioral/Psych: Mood is stable, no new changes  Breast:  Denies any palpable lumps or discharge All other systems were reviewed with the patient and are negative.  PHYSICAL EXAMINATION: ECOG PERFORMANCE STATUS: 0 - Asymptomatic  Vitals:   06/25/18 1255  BP: 127/73  Pulse: 93  Resp: 20  Temp: 98.4 F (36.9 C)  SpO2: 97%   Filed Weights   06/25/18 1255  Weight: 175 lb 11.2 oz (79.7 kg)    GENERAL:alert, no distress and comfortable SKIN: skin color, texture, turgor are normal, no rashes or significant lesions EYES: normal, conjunctiva are pink and non-injected, sclera clear OROPHARYNX:no exudate, no erythema and lips, buccal mucosa, and tongue normal  NECK: supple, thyroid normal size, non-tender, without nodularity LYMPH:  no palpable lymphadenopathy in the cervical, axillary or inguinal LUNGS: clear to auscultation and percussion with normal breathing effort HEART: regular rate & rhythm and no murmurs  and no lower extremity edema ABDOMEN:abdomen soft, non-tender and normal bowel sounds Musculoskeletal:no cyanosis of digits and no clubbing  PSYCH: alert & oriented x 3 with fluent speech NEURO: no focal motor/sensory deficits BREAST: No palpable nodules in breast. No palpable axillary or supraclavicular lymphadenopathy (exam performed in the presence of a chaperone)   LABORATORY DATA:  I have reviewed the data as listed Lab Results  Component Value Date   WBC 6.9 06/25/2018   HGB 13.9 06/25/2018   HCT 41.7 06/25/2018   MCV 92.8 06/25/2018   PLT 262 06/25/2018   Lab Results  Component Value Date   NA 139 06/25/2018   K 4.4 06/25/2018   CL 108 06/25/2018   CO2 26 06/25/2018    RADIOGRAPHIC STUDIES: I have personally reviewed the radiological reports and agreed with the findings  in the report.  ASSESSMENT AND PLAN:  Malignant neoplasm of upper-outer quadrant of left breast in female, estrogen receptor positive (B and E) 06/19/2018:Screening detected left breast mass and distortion on further evaluation of mass went away and the distortion did not have a sonographic correlate.  Stereotactic biopsy revealed grade 2 IDC ER 95%, PR 90%, Ki-67 3%, HER-2 negative ratio 1.09 along with LCIS TX N0 stage Ia clinical stage Pathology and radiology counseling:Discussed with the patient, the details of pathology including the type of breast cancer,the clinical staging, the significance of ER, PR and HER-2/neu receptors and the implications for treatment. After reviewing the pathology in detail, we proceeded to discuss the different treatment options between surgery, radiation, chemotherapy, antiestrogen therapies.  Recommendations: Breast MRI to determine extent of disease 1. Breast conserving surgery followed by 2. Oncotype DX testing to determine if chemotherapy would be of any benefit followed by 3. Adjuvant radiation therapy followed by 4. Adjuvant antiestrogen therapy  Oncotype counseling: I  discussed Oncotype DX test. I explained to the patient that this is a 21 gene panel to evaluate patient tumors DNA to calculate recurrence score. This would help determine whether patient has high risk or intermediate risk or low risk breast cancer. She understands that if her tumor was found to be high risk, she would benefit from systemic chemotherapy. If low risk, no need of chemotherapy. If she was found to be intermediate risk, we would need to evaluate the score as well as other risk factors and determine if an abbreviated chemotherapy may be of benefit.  Return to clinic after surgery to discuss final pathology report and then determine if Oncotype DX testing will need to be sent.     All questions were answered. The patient knows to call the clinic with any problems, questions or concerns.    Harriette Ohara, MD 06/25/18

## 2018-06-25 NOTE — Assessment & Plan Note (Signed)
06/19/2018:Screening detected left breast mass and distortion on further evaluation of mass went away and the distortion did not have a sonographic correlate.  Stereotactic biopsy revealed grade 2 IDC ER 95%, PR 90%, Ki-67 3%, HER-2 negative ratio 1.09 along with LCIS TX N0 stage Ia clinical stage Pathology and radiology counseling:Discussed with the patient, the details of pathology including the type of breast cancer,the clinical staging, the significance of ER, PR and HER-2/neu receptors and the implications for treatment. After reviewing the pathology in detail, we proceeded to discuss the different treatment options between surgery, radiation, chemotherapy, antiestrogen therapies.  Recommendations: Breast MRI to determine extent of disease 1. Breast conserving surgery followed by 2. Oncotype DX testing to determine if chemotherapy would be of any benefit followed by 3. Adjuvant radiation therapy followed by 4. Adjuvant antiestrogen therapy  Oncotype counseling: I discussed Oncotype DX test. I explained to the patient that this is a 21 gene panel to evaluate patient tumors DNA to calculate recurrence score. This would help determine whether patient has high risk or intermediate risk or low risk breast cancer. She understands that if her tumor was found to be high risk, she would benefit from systemic chemotherapy. If low risk, no need of chemotherapy. If she was found to be intermediate risk, we would need to evaluate the score as well as other risk factors and determine if an abbreviated chemotherapy may be of benefit.  Return to clinic after surgery to discuss final pathology report and then determine if Oncotype DX testing will need to be sent.

## 2018-06-25 NOTE — Progress Notes (Addendum)
Radiation Oncology         [phone] ________________________________  Name: Judith Mcneil        MRN: [id number]  Date of Service: 06/25/2018 DOB: 03/06/1956  CC:No primary care provider on file.  Hoxworth, Benjamin, MD     REFERRING PHYSICIAN: Hoxworth, Benjamin, MD   DIAGNOSIS: The encounter diagnosis was Malignant neoplasm of upper-outer quadrant of left breast in female, estrogen receptor positive (HCC).   HISTORY OF PRESENT ILLNESS: Judith Mcneil is a 62 y.o. female seen in the multidisciplinary breast clinic for a new diagnosis of left breast cancer. The patient was noted to have a screening distortion and mass seen in the left breast on screening mammogram.  The patient underwent ultrasound but a mass could not be visualized, her axilla was negative for adenopathy, though distortion was seen on stereotactic approach to proceed with her biopsy on 06/19/2017.  Pathology from the biopsy obtained in the upper central breast revealed a grade 2 invasive ductal carcinoma, there was LCIS as well.  Her carcinoma was ER/PR positive, HER-2/neu negative, and her Ki-67 was 3%.  She comes today to discuss treatment options for her cancer.  PREVIOUS RADIATION THERAPY: No   PAST MEDICAL HISTORY:  Past Medical History:  Diagnosis Date  . Medical history non-contributory        PAST SURGICAL HISTORY: Past Surgical History:  Procedure Laterality Date  . COLONOSCOPY     x 2  . DILATATION & CURETTAGE/HYSTEROSCOPY WITH TRUECLEAR N/A 08/14/2013   Procedure: DILATATION & CURETTAGE/HYSTEROSCOPY WITH TRUECLEAR;  Surgeon: Kathy W Richardson, MD;  Location: [facility];  Service: Gynecology;  Laterality: N/A;  1 hr in the OR  . TONSILLECTOMY     age 7     FAMILY HISTORY: No family history on file.   SOCIAL HISTORY:  reports that she has never smoked. She does not have any smokeless tobacco history on file. She reports that she drinks about 0.6 oz of alcohol per week. She reports that she  does not use drugs.   ALLERGIES: Patient has no known allergies.   MEDICATIONS:  Current Outpatient Medications  Medication Sig Dispense Refill  . buPROPion (WELLBUTRIN XL) 150 MG 24 hr tablet Take 150 mg by mouth daily.    . CALCIUM-VITAMIN D PO Take 1 tablet by mouth daily.    . estradiol (CLIMARA - DOSED IN MG/24 HR) 0.025 mg/24hr patch Place 0.025 mg onto the skin every Sunday.    . Fish Oil OIL Take 1 capsule by mouth daily.    . ibuprofen (ADVIL,MOTRIN) 200 MG tablet Take 400 mg by mouth every 6 (six) hours as needed.    . loratadine (CLARITIN) 10 MG tablet Take 10 mg by mouth daily.    . oxyCODONE-acetaminophen (PERCOCET/ROXICET) 5-325 MG per tablet Take 2 tablets by mouth every 6 (six) hours as needed for severe pain. 15 tablet 0  . valACYclovir (VALTREX) 1000 MG tablet Take 1,000 mg by mouth daily as needed (outbreak).     No current facility-administered medications for this encounter.      REVIEW OF SYSTEMS: On review of systems, the patient reports that she is doing well overall. She denies any chest pain, shortness of breath, cough, fevers, chills, night sweats, unintended weight changes. She denies any bowel or bladder disturbances, and denies abdominal pain, nausea or vomiting. She denies any new musculoskeletal or joint aches or pains. A complete review of systems is obtained and is otherwise negative.     PHYSICAL   EXAM:  Wt Readings from Last 3 Encounters:  08/14/13 184 lb (83.5 kg)   Temp Readings from Last 3 Encounters:  02/17/14 98.5 F (36.9 C) (Oral)  08/14/13 97.6 F (36.4 C)   BP Readings from Last 3 Encounters:  02/17/14 133/86  08/14/13 122/74   Pulse Readings from Last 3 Encounters:  02/17/14 89  08/14/13 91     In general this is a well appearing Caucasian female in no acute distress. She is alert and oriented x4 and appropriate throughout the examination. HEENT reveals that the patient is normocephalic, atraumatic. EOMs are intact.   Cardiopulmonary assessment is negative for acute distress and sje exhibits normal effort.  Breast exam is deferred   ECOG = 0  0 - Asymptomatic (Fully active, able to carry on all predisease activities without restriction)  1 - Symptomatic but completely ambulatory (Restricted in physically strenuous activity but ambulatory and able to carry out work of a light or sedentary nature. For example, light housework, office work)  2 - Symptomatic, <50% in bed during the day (Ambulatory and capable of all self care but unable to carry out any work activities. Up and about more than 50% of waking hours)  3 - Symptomatic, >50% in bed, but not bedbound (Capable of only limited self-care, confined to bed or chair 50% or more of waking hours)  4 - Bedbound (Completely disabled. Cannot carry on any self-care. Totally confined to bed or chair)  5 - Death   Eustace Pen MM, Creech RH, Tormey DC, et al. [phone]). "Toxicity and response criteria of the [facility]". [place] Oncol. 5 (6): 649-55    LABORATORY DATA:  Lab Results  Component Value Date   WBC 8.8 08/11/2013   HGB 13.9 08/11/2013   HCT 39.8 08/11/2013   MCV 90.0 08/11/2013   PLT 289 08/11/2013   No results found for: NA, K, CL, CO2 No results found for: ALT, AST, GGT, ALKPHOS, BILITOT    RADIOGRAPHY: US Breast Ltd Uni Left Inc Axilla  Result Date: 06/16/2018 CLINICAL DATA:  62 year old patient recalled from recent screening mammogram for evaluation of architectural distortion and possible mass in the left breast. EXAM: DIGITAL DIAGNOSTIC LEFT MAMMOGRAM WITH TOMO ULTRASOUND LEFT BREAST COMPARISON:  June 03, 2018 ACR Breast Density Category c: The breast tissue is heterogeneously dense, which may obscure small masses. FINDINGS: Spot compression views of the left breast confirm an area of architectural distortion in the upper central left breast, middle third. Dense fibroglandular tissue is seen more anteriorly, in the  retroareolar left breast, without a discrete mass. On physical exam, no mass is palpated in the upper central left breast. Targeted ultrasound is performed, showing dense fibroglandular tissue in the retroareolar and upper central left breast. No definite sonographic correlate to the area of architectural distortion is identified with ultrasound. No suspicious mass is seen. Ultrasound of the left axilla is negative for lymphadenopathy. IMPRESSION: Persistent architectural distortion in the upper central left breast, middle third. Malignancy cannot be excluded. RECOMMENDATION: Stereotactic left breast biopsy is recommended. The procedure for biopsy has been discussed with the patient today. I have discussed the findings and recommendations with the patient. Results were also provided in writing at the conclusion of the visit. If applicable, a reminder letter will be sent to the patient regarding the next appointment. BI-RADS CATEGORY  4: Suspicious. Electronically Signed   By: Curlene Dolphin M.D.   On: 06/16/2018 15:41   Mm Diag Breast Tomo Uni Left  Result Date: 06/16/2018 CLINICAL DATA:  62 year old patient recalled from recent screening mammogram for evaluation of architectural distortion and possible mass in the left breast. EXAM: DIGITAL DIAGNOSTIC LEFT MAMMOGRAM WITH TOMO ULTRASOUND LEFT BREAST COMPARISON:  June 03, 2018 ACR Breast Density Category c: The breast tissue is heterogeneously dense, which may obscure small masses. FINDINGS: Spot compression views of the left breast confirm an area of architectural distortion in the upper central left breast, middle third. Dense fibroglandular tissue is seen more anteriorly, in the retroareolar left breast, without a discrete mass. On physical exam, no mass is palpated in the upper central left breast. Targeted ultrasound is performed, showing dense fibroglandular tissue in the retroareolar and upper central left breast. No definite sonographic correlate to the  area of architectural distortion is identified with ultrasound. No suspicious mass is seen. Ultrasound of the left axilla is negative for lymphadenopathy. IMPRESSION: Persistent architectural distortion in the upper central left breast, middle third. Malignancy cannot be excluded. RECOMMENDATION: Stereotactic left breast biopsy is recommended. The procedure for biopsy has been discussed with the patient today. I have discussed the findings and recommendations with the patient. Results were also provided in writing at the conclusion of the visit. If applicable, a reminder letter will be sent to the patient regarding the next appointment. BI-RADS CATEGORY  4: Suspicious. Electronically Signed   By: Curlene Dolphin M.D.   On: 06/16/2018 15:41   Mm Clip Placement Left  Result Date: 06/19/2018 CLINICAL DATA:  Stereotactic biopsy was performed of an area of architectural distortion in the upper central left breast. EXAM: DIAGNOSTIC LEFT MAMMOGRAM POST STEREOTACTIC BIOPSY COMPARISON:  Previous exam(s). FINDINGS: Mammographic images were obtained following stereotactic guided biopsy of an area of architectural distortion in the upper central left breast. A coil shaped biopsy clip is satisfactorily positioned in the area of architectural distortion in the upper central left breast, middle third. IMPRESSION: Satisfactory position of coil shaped biopsy clip. Final Assessment: Post Procedure Mammograms for Marker Placement Electronically Signed   By: Curlene Dolphin M.D.   On: 06/19/2018 09:03   Mm Lt Breast Bx W Loc Dev 1st Lesion Image Bx Spec Stereo Guide  Addendum Date: 06/23/2018   ADDENDUM REPORT: 06/20/2018 14:59 ADDENDUM: Pathology revealed GRADE II INVASIVE MAMMARY CARCINOMA, MAMMARY CARCINOMA IN SITU of the Left breast, upper central. This was found to be concordant by Dr. Curlene Dolphin. Pathology results were discussed with the patient by telephone. The patient reported doing well after the biopsy with tenderness  at the site. Post biopsy instructions and care were reviewed and questions were answered. The patient was encouraged to call [facility] for any additional concerns. The patient was referred to [facility] on June 25, 2018. Pathology results reported by Terie Purser, RN on 06/20/2018. Electronically Signed   By: Curlene Dolphin M.D.   On: 06/20/2018 14:59   Result Date: 06/23/2018 CLINICAL DATA:  Stereotactic biopsy was recommended of an area of architectural distortion in the upper central left breast. EXAM: LEFT BREAST STEREOTACTIC CORE NEEDLE BIOPSY COMPARISON:  Previous exams. FINDINGS: The patient and I discussed the procedure of stereotactic-guided biopsy including benefits and alternatives. We discussed the high likelihood of a successful procedure. We discussed the risks of the procedure including infection, bleeding, tissue injury, clip migration, and inadequate sampling. Informed written consent was given. The usual time out protocol was performed immediately prior to the procedure. Using sterile technique and  1% Lidocaine as local anesthetic, under stereotactic guidance, a 9 gauge vacuum assisted device was used to perform core needle biopsy of architectural distortion in the upper central left breast using a craniocaudal approach. Specimen radiograph was performed showing tissue. Lesion quadrant: Upper outer quadrant At the conclusion of the procedure, a coil tissue marker clip was deployed into the biopsy cavity. Follow-up 2-view mammogram was performed and dictated separately. IMPRESSION: Stereotactic-guided biopsy of the left breast. No apparent complications. Electronically Signed: By: Susan  Turner M.D. On: 06/19/2018 09:05       IMPRESSION/PLAN: 1. Stage IA, T1cN0M0, grade 2, ER/PR positive invasive ductal carcinoma with LCIS of the left breast. Dr. Moody discusses the pathology findings and  reviews the nature of invasive and nonmalignant breast disease. The consensus from the breast conference includes proceeding with MRI for extent of disease given the ultrasound did not identify the area of distortion.  She appears to be a candidate for possible lumpectomy with sentinel lymph node biopsy.  Depending on the size of the final tumor measurements rendered by pathology, the tumor may be tested for Oncotype Dx score to determine a role for systemic therapy. Provided that chemotherapy is not indicated, the patient's course would then be followed by external radiotherapy to the breast followed by antiestrogen therapy. We discussed the risks, benefits, short, and long term effects of radiotherapy, and the patient is interested in proceeding. Dr. Moody discusses the delivery and logistics of radiotherapy and anticipates a course of 4 or 6 1/2 weeks of radiotherapy, and based on what we know from current data, he would anticipate a course of 4 weeks. We will see her back about 2 weeks after surgery to discuss the simulation process and anticipate we starting radiotherapy about 4-6 weeks after surgery.   In a visit lasting 60 minutes, greater than 50% of the time was spent face to face discussing her case, and coordinating the patient's care.  The above documentation reflects my direct findings during this shared patient visit. Please see the separate note by Dr. Moody on this date for the remainder of the patient's plan of care.    Alison C. Perkins, PAC   

## 2018-06-25 NOTE — Therapy (Signed)
[place], [place], [address] Phone: [phone]   Fax:  [phone]  Physical Therapy Evaluation  Patient Details  Name: Judith Mcneil MRN: [id number] Date of Birth: [birth year] Referring Provider: Dr. Excell Seltzer   Encounter Date: 06/25/2018  PT End of Session - 06/25/18 1602    Visit Number  1    Number of Visits  2    Date for PT Re-Evaluation  08/20/18    PT Start Time  1319    PT Stop Time  1345    PT Time Calculation (min)  26 min    Activity Tolerance  Patient tolerated treatment well    Behavior During Therapy  [facility] for tasks assessed/performed       Past Medical History:  Diagnosis Date  . Medical history non-contributory     Past Surgical History:  Procedure Laterality Date  . COLONOSCOPY     x 2  . DILATATION & CURETTAGE/HYSTEROSCOPY WITH TRUECLEAR N/A 08/14/2013   Procedure: DILATATION & CURETTAGE/HYSTEROSCOPY WITH TRUECLEAR;  Surgeon: Logan Bores, MD;  Location: [facility];  Service: Gynecology;  Laterality: N/A;  1 hr in the OR  . TONSILLECTOMY     age 62    There were no vitals filed for this visit.   Subjective Assessment - 06/25/18 1555    Subjective  Patient reports she is here today to be seen by her medical team for her newly diagnosed left breast cancer.    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 It is an area of distortion that is ER/PR positive and HER2 negative with a Ki67 of 3%.     Patient Stated Goals  Reduce lymphedema risk and learn post op shoulder ROM HEP    Currently in Pain?  Yes    Pain Score  3     Pain Location  Shoulder    Pain Orientation  Right    Pain Descriptors / Indicators  Aching    Pain Type  Chronic pain    Pain Onset  More than a month ago    Pain Frequency  Intermittent    Aggravating Factors    Driving    Pain Relieving Factors  Not driving    Multiple Pain Sites  No         [facility] PT Assessment - 06/25/18 0001      Assessment   Medical Diagnosis  Left breast cancer    Referring Provider  Dr. Excell Seltzer    Onset Date/Surgical Date  06/03/18    Hand Dominance  Right    Prior Therapy  none      Precautions   Precautions  Other (comment)    Precaution Comments  active cancer      Restrictions   Weight Bearing Restrictions  No      Balance Screen   Has the patient fallen in the past 6 months  No    Has the patient had a decrease in activity level because of a fear of falling?   No    Is the patient reluctant to leave their home because of a fear of falling?   No      Home Environment   Living Environment  Private residence    Living Arrangements  Alone    Available Help at Discharge  Family  Prior Function   Level of Independence  Independent    Vocation  Full time employment    Programme researcher, broadcasting/film/video rep for contact lenses    Leisure  She does not exercise      Cognition   Overall Cognitive Status  Within Functional Limits for tasks assessed      Posture/Postural Control   Posture/Postural Control  Postural limitations    Postural Limitations  Rounded Shoulders;Forward head      ROM / Strength   AROM / PROM / Strength  AROM;Strength      AROM   AROM Assessment Site  Shoulder;Cervical    Right/Left Shoulder  Right;Left    Right Shoulder Extension  55 Degrees    Right Shoulder Flexion  155 Degrees    Right Shoulder ABduction  159 Degrees    Right Shoulder Internal Rotation  71 Degrees    Right Shoulder External Rotation  75 Degrees    Left Shoulder Extension  51 Degrees    Left Shoulder Flexion  158 Degrees    Left Shoulder ABduction  169 Degrees    Left Shoulder Internal Rotation  71 Degrees    Left Shoulder External Rotation  83 Degrees    Cervical Flexion  WNL    Cervical Extension  WNL    Cervical - Right Side Bend  WNL     Cervical - Left Side Bend  WNL    Cervical - Right Rotation  WNL    Cervical - Left Rotation  WNL      Strength   Overall Strength  Within functional limits for tasks performed        LYMPHEDEMA/ONCOLOGY QUESTIONNAIRE - 06/25/18 1600      Type   Cancer Type  Left breast cancer      Lymphedema Assessments   Lymphedema Assessments  Upper extremities      Right Upper Extremity Lymphedema   10 cm Proximal to Olecranon Process  31 cm    Olecranon Process  28.5 cm    10 cm Proximal to Ulnar Styloid Process  22.1 cm    Just Proximal to Ulnar Styloid Process  16.6 cm    Across Hand at [employer]  18.9 cm    At [place] of 2nd Digit  6.4 cm      Left Upper Extremity Lymphedema   10 cm Proximal to Olecranon Process  29.4 cm    Olecranon Process  25 cm    10 cm Proximal to Ulnar Styloid Process  21.9 cm    Just Proximal to Ulnar Styloid Process  15.8 cm    Across Hand at [employer]  18.6 cm    At [place] of 2nd Digit  6.4 cm             Objective measurements completed on examination: See above findings.              PT Education - 06/25/18 1601    Education Details  Lymphedema risk reduction and post op shoulder ROM HEP    Person(s) Educated  Patient;Child(ren)    Methods  Explanation;Demonstration;Handout    Comprehension  Returned demonstration;Verbalized understanding          PT Long Term Goals - 06/25/18 1605      PT LONG TERM GOAL #1   Title  Patient will demonstrate she has returned to bsaeline related to shoulder ROM and function.    Time  8    Period  Weeks    Status  New      Breast Clinic Goals - 06/25/18 1605      Patient will be able to verbalize understanding of pertinent lymphedema risk reduction practices relevant to her diagnosis specifically related to skin care.   Time  1    Period  Days    Status  Achieved      Patient will be able to return demonstrate and/or verbalize understanding of the post-op home exercise program  related to regaining shoulder range of motion.   Time  1    Period  Days    Status  Achieved      Patient will be able to verbalize understanding of the importance of attending the postoperative After Breast Cancer Class for further lymphedema risk reduction education and therapeutic exercise.   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 It is an area of distortion that is ER/PR positive and HER2 negative with a Ki67 of 3%. Her multidisciplinary medical team met prior to her assessments to determine a recommended treatment plan. She is planning to have a left lumpectomy and sentinel node biopsy followed by Oncotype testing, radiation, and anti-estorgen therapy. She will benefit from a post op PT visit to determine needs.    History and Personal Factors relevant to plan of care:  Lives alone    Clinical Presentation  Stable    Clinical Decision Making  Low    Rehab Potential  Excellent    Clinical Impairments Affecting Rehab Potential  None    PT Frequency  1x / week    PT Treatment/Interventions  ADLs/Self Care Home Management;Therapeutic exercise;Patient/family education    PT Next Visit Plan  Will reassess 3-4 weeks post op    PT Home Exercise Plan  Post op shoulder ROM HEP    Consulted and Agree with Plan of Care  Patient;Family member/caregiver    Family Member Consulted  Daughter       Patient will benefit from skilled therapeutic intervention in order to improve the following deficits and impairments:  Decreased knowledge of precautions, Impaired UE functional use, Decreased range of motion, Postural dysfunction, Pain  Visit Diagnosis: Malignant neoplasm of upper-outer quadrant of left breast in female, estrogen receptor positive ([place]) - Plan: PT plan of care cert/re-cert  Abnormal posture -  Plan: PT plan of care cert/re-cert     Problem List Patient Active Problem List   Diagnosis Date Noted  . Malignant neoplasm of upper-outer quadrant of left breast in female, estrogen receptor positive ([place]) 06/24/2018    Annia Friendly, PT 06/25/18 4:07 PM  [place], [place], [address] Phone: [phone]   Fax:  [phone]  Name: MADDI COLLAR MRN: [id number] Date of Birth: March 08, 1956

## 2018-06-25 NOTE — Patient Instructions (Signed)

## 2018-06-27 ENCOUNTER — Other Ambulatory Visit: Payer: Self-pay | Admitting: General Surgery

## 2018-06-27 ENCOUNTER — Encounter: Payer: Self-pay | Admitting: General Practice

## 2018-06-27 DIAGNOSIS — C50912 Malignant neoplasm of unspecified site of left female breast: Secondary | ICD-10-CM

## 2018-06-27 NOTE — Progress Notes (Signed)
[place] Psychosocial Distress Screening Spiritual Care  Phoned [facility] following Breast Multidisciplinary Clinic to introduce [place] team/resources, where she completed distress screen per protocol, to assess for distress and other psychosocial needs.  The patient scored a 8 on the Psychosocial Distress Thermometer which indicates severe distress.  ONCBCN DISTRESS SCREENING 06/27/2018  Screening Type Initial Screening  Distress experienced in past week (1-10) 8  Practical problem type Work/school  Emotional problem type Adjusting to illness  Information Concerns Type Lack of info about diagnosis;Lack of info about treatment;Lack of info about complementary therapy choices;Lack of info about maintaining fitness  Referral to support programs Yes   Per nurse navigator Jessee Avers, Ms Wiegert feels "much better" after learning about scope of dx/tx and meeting team in [facility].   Follow up needed: Yes.  LVM introducing [place], encouraging call back. Plan to call again if pt does not return call.   [place], [place], [facility] Pager [phone] Voicemail [phone]

## 2018-06-29 ENCOUNTER — Ambulatory Visit
Admission: RE | Admit: 2018-06-29 | Discharge: 2018-06-29 | Disposition: A | Discharge status: Home / Self Care | Payer: BLUE CROSS/BLUE SHIELD | Source: Ambulatory Visit | Attending: Hematology and Oncology | Admitting: Hematology and Oncology

## 2018-06-29 DIAGNOSIS — Z170 Estrogen receptor positive status [ER+]: Secondary | ICD-10-CM

## 2018-06-29 DIAGNOSIS — C50412 Malignant neoplasm of upper-outer quadrant of left female breast: Secondary | ICD-10-CM

## 2018-06-29 MED ORDER — GADOBENATE DIMEGLUMINE 529 MG/ML IV SOLN
15.0000 mL | Freq: Once | INTRAVENOUS | Status: AC | PRN
  Administered 2018-06-29: 15 mL via INTRAVENOUS

## 2018-07-01 ENCOUNTER — Encounter: Payer: Self-pay | Admitting: General Practice

## 2018-07-01 NOTE — Progress Notes (Signed)
[place] Spiritual Care Note  LVM to f/u after [facility], offering support via Patient and Family Support Center/Spiritual Care as desired.   [place], [place], [facility] Pager [phone] Voicemail [phone]

## 2018-07-02 ENCOUNTER — Telehealth: Payer: Self-pay | Admitting: *Deleted

## 2018-07-02 ENCOUNTER — Other Ambulatory Visit: Payer: Self-pay | Admitting: Hematology and Oncology

## 2018-07-02 ENCOUNTER — Other Ambulatory Visit: Payer: Self-pay | Admitting: *Deleted

## 2018-07-02 DIAGNOSIS — R928 Other abnormal and inconclusive findings on diagnostic imaging of breast: Secondary | ICD-10-CM

## 2018-07-02 NOTE — Telephone Encounter (Signed)
Called pt and discussed MRI results and need for MRI bx on left breast per Dr. Lindi Adie. Informed pt orders have been placed and she will receive a call from GI with an appt. Denies further needs at this time.

## 2018-07-03 ENCOUNTER — Telehealth: Payer: Self-pay | Admitting: Hematology and Oncology

## 2018-07-03 NOTE — Telephone Encounter (Signed)
Spoke to patient regarding upcoming august appts per 7/10 sch message

## 2018-07-04 ENCOUNTER — Ambulatory Visit: Payer: Self-pay

## 2018-07-04 ENCOUNTER — Ambulatory Visit
Admission: RE | Admit: 2018-07-04 | Discharge: 2018-07-04 | Disposition: A | Discharge status: Home / Self Care | Payer: BLUE CROSS/BLUE SHIELD | Source: Ambulatory Visit | Attending: Hematology and Oncology | Admitting: Hematology and Oncology

## 2018-07-04 DIAGNOSIS — R928 Other abnormal and inconclusive findings on diagnostic imaging of breast: Secondary | ICD-10-CM

## 2018-07-04 MED ORDER — GADOBENATE DIMEGLUMINE 529 MG/ML IV SOLN: 20.0000 mL | Freq: Once | INTRAVENOUS | Status: DC | PRN

## 2018-07-07 ENCOUNTER — Ambulatory Visit
Admission: RE | Admit: 2018-07-07 | Discharge: 2018-07-07 | Disposition: A | Discharge status: Home / Self Care | Payer: BLUE CROSS/BLUE SHIELD | Source: Ambulatory Visit | Attending: Hematology and Oncology | Admitting: Hematology and Oncology

## 2018-07-07 ENCOUNTER — Other Ambulatory Visit: Payer: Self-pay | Admitting: General Surgery

## 2018-07-07 ENCOUNTER — Ambulatory Visit
Admission: RE | Admit: 2018-07-07 | Discharge: 2018-07-07 | Disposition: A | Discharge status: Home / Self Care | Payer: BLUE CROSS/BLUE SHIELD | Source: Ambulatory Visit | Attending: General Surgery | Admitting: General Surgery

## 2018-07-07 ENCOUNTER — Other Ambulatory Visit: Payer: Self-pay | Admitting: Hematology and Oncology

## 2018-07-07 DIAGNOSIS — N63 Unspecified lump in unspecified breast: Secondary | ICD-10-CM

## 2018-07-07 MED ORDER — GADOBENATE DIMEGLUMINE 529 MG/ML IV SOLN
16.0000 mL | Freq: Once | INTRAVENOUS | Status: AC | PRN
  Administered 2018-07-07: 16 mL via INTRAVENOUS

## 2018-07-09 ENCOUNTER — Telehealth: Payer: Self-pay | Admitting: Hematology and Oncology

## 2018-07-09 NOTE — Telephone Encounter (Signed)
Left message for patient regarding scheduling a gen counsel appts

## 2018-07-09 NOTE — Telephone Encounter (Signed)
Spoke to patient regarding upcoming appts. Patient scheduled per provider request 7/17

## 2018-07-10 ENCOUNTER — Ambulatory Visit: Payer: Self-pay | Admitting: General Surgery

## 2018-07-10 DIAGNOSIS — D0511 Intraductal carcinoma in situ of right breast: Secondary | ICD-10-CM

## 2018-07-11 ENCOUNTER — Other Ambulatory Visit: Payer: Self-pay | Admitting: General Surgery

## 2018-07-11 ENCOUNTER — Telehealth: Payer: Self-pay | Admitting: Hematology and Oncology

## 2018-07-11 ENCOUNTER — Telehealth: Payer: Self-pay | Admitting: Genetic Counselor

## 2018-07-11 DIAGNOSIS — C50912 Malignant neoplasm of unspecified site of left female breast: Secondary | ICD-10-CM

## 2018-07-11 NOTE — Telephone Encounter (Signed)
Patient called to r/s appts. Ok per KP.

## 2018-07-11 NOTE — Telephone Encounter (Signed)
Patient scheduled per provider request 7/17.

## 2018-07-11 NOTE — Telephone Encounter (Signed)
Left message for patient regarding upcoming July appts.  R/s patients appointment per KP to BT.

## 2018-07-14 ENCOUNTER — Encounter: Payer: BLUE CROSS/BLUE SHIELD | Admitting: Genetic Counselor

## 2018-07-14 ENCOUNTER — Other Ambulatory Visit: Payer: BLUE CROSS/BLUE SHIELD

## 2018-07-15 ENCOUNTER — Encounter: Payer: BLUE CROSS/BLUE SHIELD | Admitting: Genetic Counselor

## 2018-07-15 ENCOUNTER — Encounter: Payer: Self-pay | Admitting: Licensed Clinical Social Worker

## 2018-07-15 ENCOUNTER — Inpatient Hospital Stay: Payer: BLUE CROSS/BLUE SHIELD

## 2018-07-15 ENCOUNTER — Telehealth: Payer: Self-pay | Admitting: Hematology and Oncology

## 2018-07-15 ENCOUNTER — Inpatient Hospital Stay (HOSPITAL_BASED_OUTPATIENT_CLINIC_OR_DEPARTMENT_OTHER): Payer: BLUE CROSS/BLUE SHIELD | Admitting: Licensed Clinical Social Worker

## 2018-07-15 DIAGNOSIS — Z803 Family history of malignant neoplasm of breast: Secondary | ICD-10-CM | POA: Insufficient documentation

## 2018-07-15 DIAGNOSIS — Z800 Family history of malignant neoplasm of digestive organs: Secondary | ICD-10-CM

## 2018-07-15 DIAGNOSIS — Z7183 Encounter for nonprocreative genetic counseling: Secondary | ICD-10-CM

## 2018-07-15 DIAGNOSIS — C50412 Malignant neoplasm of upper-outer quadrant of left female breast: Secondary | ICD-10-CM | POA: Diagnosis not present

## 2018-07-15 DIAGNOSIS — Z170 Estrogen receptor positive status [ER+]: Secondary | ICD-10-CM | POA: Diagnosis not present

## 2018-07-15 NOTE — Progress Notes (Signed)
REFERRING PROVIDER: Excell Seltzer, MD [facility] [place], [place] [address]  PRIMARY PROVIDER:  Patient, No Pcp Per  PRIMARY REASON FOR VISIT:  1. Malignant neoplasm of upper-outer quadrant of left breast in female, estrogen receptor positive ([place])   2. Family history of breast cancer   3. Family history of colon cancer in father      HISTORY OF PRESENT ILLNESS:   Judith Mcneil, a 62 y.o. female, was seen for a  cancer genetics consultation at the request of Dr. Excell Seltzer due to a personal and family history of cancer.  Judith Mcneil presents to clinic today to discuss the possibility of a hereditary predisposition to cancer, genetic testing, and to further clarify her future cancer risks, as well as potential cancer risks for family members.   In 2019, at the age of 2, Judith Mcneil was diagnosed with cancer of the left breast, IDC, ER+/PR+/HER-2 negative, along with LCIS . She was also recently diagnosed with DCIS of the right breast.   CANCER HISTORY:    Malignant neoplasm of upper-outer quadrant of left breast in female, estrogen receptor positive ([place])   06/19/2018 Initial Diagnosis    Screening detected left breast mass and distortion on further evaluation of mass went away and the distortion did not have a sonographic correlate.  Stereotactic biopsy revealed grade 2 IDC ER 95%, PR 90%, Ki-67 3%, HER-2 negative ratio 1.09 along with LCIS TX N0 stage Ia clinical stage        HORMONAL RISK FACTORS:  Menarche was at age 57.  First live birth at age 34.  OCP use for approximately 20 years.  Ovaries intact: yes.  Hysterectomy: no.  Menopausal status: postmenopausal.  HRT use: 5 years. Colonoscopy: yes; her first colonoscopy was abnormal with 5-6 polyps, but she has had 2 more recent normal colonoscopies. Mammogram within the last year: yes. Number of breast biopsies: 1, 6 or 7 years ago.    Past Medical History:  Diagnosis Date  . Family  history of breast cancer   . Family history of colon cancer in father   . Medical history non-contributory     Past Surgical History:  Procedure Laterality Date  . COLONOSCOPY     x 2  . DILATATION & CURETTAGE/HYSTEROSCOPY WITH TRUECLEAR N/A 08/14/2013   Procedure: DILATATION & CURETTAGE/HYSTEROSCOPY WITH TRUECLEAR;  Surgeon: Logan Bores, MD;  Location: [facility];  Service: Gynecology;  Laterality: N/A;  1 hr in the OR  . TONSILLECTOMY     age 28    Social History   Socioeconomic History  . Marital status: Widowed    Spouse name: Not on file  . Number of children: Not on file  . Years of education: Not on file  . Highest education level: Not on file  Occupational History  . Not on file  Social Needs  . Financial resource strain: Not on file  . Food insecurity:    Worry: Not on file    Inability: Not on file  . Transportation needs:    Medical: Not on file    Non-medical: Not on file  Tobacco Use  . Smoking status: Never Smoker  . Smokeless tobacco: Never Used  Substance and Sexual Activity  . Alcohol use: Yes    Alcohol/week: 0.6 oz    Types: 1 Glasses of wine per week    Comment: few times a week  . Drug use: No  . Sexual activity: Not on file  Lifestyle  .  Physical activity:    Days per week: Not on file    Minutes per session: Not on file  . Stress: Not on file  Relationships  . Social connections:    Talks on phone: Not on file    Gets together: Not on file    Attends religious service: Not on file    Active member of club or organization: Not on file    Attends meetings of clubs or organizations: Not on file    Relationship status: Not on file  Other Topics Concern  . Not on file  Social History Narrative  . Not on file     FAMILY HISTORY:  We obtained a detailed, 4-generation family history.  Significant diagnoses are listed below: Family History  Problem Relation Age of Onset  . Alzheimer's disease Mother   . Colon cancer Father        dx  mid 47s  . Breast cancer Paternal Grandmother        dx 61s  . Breast cancer Paternal Aunt        dx 50s  . Schizophrenia Daughter    Judith Mcneil has two daughters, ages 18 and 42. Her oldest daughter has a 67 month old son and her youngest daughter has schizophrenia. Judith Mcneil has a brother who is 39 with no history of cancer and one son who is 36.  Judith Mcneil father was diagnosed with colon cancer in  His mid 66s and died at 61. He had one brother and two sisters. One of his sisters had breast cancer diagnosed in her 66s and passed away mid 17s. Judith Mcneil's paternal cousins do not have a cancer history. Judith Mcneil's paternal grandmother was diagnosed with breast cancer in her 17s and died in her 66s. Her grandfather died at 87.  Judith Mcneil mother died at 71 from Alzheimer's. She had one full brother, one half brother through her mom, and 2 half brothers and a sister through her dad. Judith Mcneil estimates she has about 25 maternal cousins and does not know of any cancer history. Her maternal grandfather passed away at 60 and her maternal grandmother passed away at 30, no cancer for either of them.   Judith Mcneil is unaware of previous family history of genetic testing for hereditary cancer risks. Patient's maternal ancestors are of English/Irish descent, and paternal ancestors are of English/Irish descent. There no reported Ashkenazi Jewish ancestry. There is no known consanguinity.  GENETIC COUNSELING ASSESSMENT: Judith Mcneil is a 62 y.o. female with a personal and family history which is somewhat suggestive of a Hereditary Cancer Predisposition Syndrome. We, therefore, discussed and recommended the following at today's visit.   DISCUSSION: We reviewed the characteristics, features and inheritance patterns of hereditary cancer syndromes. We also discussed genetic testing, including the appropriate family members to test, the process of testing, insurance  coverage and turn-around-time for results. We discussed the implications of a negative, positive and/or variant of uncertain significant result. In order to get genetic test results in a timely manner so that Judith Mcneil can use these genetic test results for surgical decisions, we recommended Judith Mcneil pursue genetic testing for the [facility] STAT Breast cancer panel. If this test is negative, we then recommend Judith Mcneil pursue reflex genetic testing to the [facility] Common Hereditary Cancer 47 gene panel.   The STAT Breast cancer panel offered by [facility] includes sequencing and rearrangement analysis for the following 9 genes:  ATM, BRCA1, BRCA2, CDH1, CHEK2, PALB2, PTEN,  STK11 and TP53.    The Common Hereditary Cancer Panel offered by [facility] includes sequencing and/or deletion duplication testing of the following 47 genes: APC, ATM, AXIN2, BARD1, BMPR1A, BRCA1, BRCA2, BRIP1, CDH1, CDKN2A (p14ARF), CDKN2A (p16INK4a), CKD4, CHEK2, CTNNA1, DICER1, EPCAM (Deletion/duplication testing only), GREM1 (promoter region deletion/duplication testing only), KIT, MEN1, MLH1, MSH2, MSH3, MSH6, MUTYH, NBN, NF1, NHTL1, PALB2, PDGFRA, PMS2, POLD1, POLE, PTEN, RAD50, RAD51C, RAD51D, SDHB, SDHC, SDHD, SMAD4, SMARCA4. STK11, TP53, TSC1, TSC2, and VHL.  The following genes were evaluated for sequence changes only: SDHA and HOXB13 c.251G>A variant only.  We discussed that only 5-10% of cancers are associated with a Hereditary cancer predisposition syndrome.  One of the most common hereditary cancer syndromes that increases breast cancer risk is called Hereditary Breast and Ovarian Cancer (HBOC) syndrome.  This syndrome is caused by mutations in the BRCA1 and BRCA2 genes.  This syndrome increases an individual's lifetime risk to develop breast, ovarian, pancreatic, and other types of cancer.  There are also many other cancer predisposition syndromes caused by mutations in several other genes.  We discussed that if she  is found to have a mutation in one of these genes, it may impact surgical decisions, and alter future medical management recommendations such as increased cancer screenings and consideration of risk reducing surgeries.  A positive result could also have implications for the patient's family members.  A Negative result would mean we were unable to identify a hereditary component to her cancer, but does not rule out the possibility of a hereditary basis for her cancer.  There could be mutations that are undetectable by current technology, or in genes not yet tested or identified to increase cancer risk.    We discussed the potential to find a Variant of Uncertain Significance or VUS.  These are variants that have not yet been identified as pathogenic or benign, and it is unknown if this variant is associated with increased cancer risk or if this is a normal finding.  Most VUS's are reclassified to benign or likely benign.   It should not be used to make medical management decisions. With time, we suspect the lab will determine the significance of any VUS's identified if any.   Based on Judith Mcneil's personal and family history of cancer, she meets medical criteria for genetic testing. Despite that she meets criteria, she may still have an out of pocket cost. The lab can provide her with her OOP cost.  PLAN: After considering the risks, benefits, and limitations, Judith Mcneil  provided informed consent to pursue genetic testing and the blood sample was sent to [facility] for analysis of the STAT Breast cancer panel. Results should be available within approximately 7-10 days time, at which point they will be disclosed by telephone to Ms. Arico, as will any additional recommendations warranted by these results. Ms. Tedder will receive a summary of her genetic counseling visit and a copy of her results once available. This information will also be available in Epic. We encouraged Ms.  Altieri to remain in contact with cancer genetics annually so that we can continuously update the family history and inform her of any changes in cancer genetics and testing that may be of benefit for her family. Ms. Russon questions were answered to her satisfaction today. Our contact information was provided should additional questions or concerns arise.  Lastly, we encouraged Ms. Shewmake to remain in contact with cancer genetics annually so that we can continuously update the family history and inform  her of any changes in cancer genetics and testing that may be of benefit for this family.   Ms.  Killilea questions were answered to her satisfaction today. Our contact information was provided should additional questions or concerns arise. Thank you for the referral and allowing [place] to share in the care of your patient.   Epimenio Foot, MS Genetic Counselor [email or web address] Phone: [phone]  The patient was seen for a total of 40 minutes in face-to-face genetic counseling. This patient was discussed with Drs. Magrinat, Lindi Adie and/or Burr Medico who agrees with the above.

## 2018-07-15 NOTE — Telephone Encounter (Signed)
Mailed patient calendar of upcoming aug appts updates per 7/23 sch message

## 2018-07-17 ENCOUNTER — Encounter: Payer: BLUE CROSS/BLUE SHIELD | Admitting: Licensed Clinical Social Worker

## 2018-07-17 ENCOUNTER — Other Ambulatory Visit: Payer: BLUE CROSS/BLUE SHIELD

## 2018-07-22 ENCOUNTER — Telehealth: Payer: Self-pay | Admitting: Licensed Clinical Social Worker

## 2018-07-22 ENCOUNTER — Ambulatory Visit: Payer: Self-pay | Admitting: Licensed Clinical Social Worker

## 2018-07-22 ENCOUNTER — Encounter: Payer: Self-pay | Admitting: Licensed Clinical Social Worker

## 2018-07-22 DIAGNOSIS — Z1379 Encounter for other screening for genetic and chromosomal anomalies: Secondary | ICD-10-CM | POA: Insufficient documentation

## 2018-07-22 NOTE — Telephone Encounter (Signed)
Spoke with Ms. Dingwall regarding her negative genetic test results. This is reassuring. Let her know that her paternal cousins could also have genetic counseling and testing.

## 2018-07-22 NOTE — Progress Notes (Signed)
HPI:  Judith Mcneil was previously seen in the [facility] on 07/15/2018 due to a personal and family history of breast cancer and concerns regarding a hereditary predisposition to cancer. Please refer to our prior cancer genetics clinic note for more information regarding Judith Mcneil's medical, social and family histories, and our assessment and recommendations, at the time. Judith Mcneil recent genetic test results were disclosed to her, as well as recommendations warranted by these results. These results and recommendations are discussed in more detail below.  CANCER HISTORY:    Malignant neoplasm of upper-outer quadrant of left breast in female, estrogen receptor positive ([facility])   06/19/2018 Initial Diagnosis    Screening detected left breast mass and distortion on further evaluation of mass went away and the distortion did not have a sonographic correlate.  Stereotactic biopsy revealed grade 2 IDC ER 95%, PR 90%, Ki-67 3%, HER-2 negative ratio 1.09 along with LCIS TX N0 stage Ia clinical stage       Genetic Testing    Negative genetic testing on the [facility] Breast Cancer STAT panel and Common Hereditary Cancers Panel. The STAT Breast cancer panel offered by [facility] includes sequencing and rearrangement analysis for the following 9 genes:  ATM, BRCA1, BRCA2, CDH1, CHEK2, PALB2, PTEN, STK11 and TP53.  The Common Hereditary Cancer Panel offered by [facility] includes sequencing and/or deletion duplication testing of the following 47 genes: APC, ATM, AXIN2, BARD1, BMPR1A, BRCA1, BRCA2, BRIP1, CDH1, CDKN2A (p14ARF), CDKN2A (p16INK4a), CKD4, CHEK2, CTNNA1, DICER1, EPCAM (Deletion/duplication testing only), GREM1 (promoter region deletion/duplication testing only), KIT, MEN1, MLH1, MSH2, MSH3, MSH6, MUTYH, NBN, NF1, NHTL1, PALB2, PDGFRA, PMS2, POLD1, POLE, PTEN, RAD50, RAD51C, RAD51D, SDHB, SDHC, SDHD, SMAD4, SMARCA4. STK11, TP53, TSC1, TSC2, and VHL.  The following genes were  evaluated for sequence changes only: SDHA and HOXB13 c.251G>A variant only.  The report date is 07/21/2018.        FAMILY HISTORY:  We obtained a detailed, 4-generation family history.  Significant diagnoses are listed below: Family History  Problem Relation Age of Onset  . Alzheimer's disease Mother   . Colon cancer Father        dx mid 65s  . Breast cancer Paternal Grandmother        dx 71s  . Breast cancer Paternal Aunt        dx 84s  . Schizophrenia Daughter    Judith Mcneil has two daughters, ages 66 and 68. Her oldest daughter has a 68 month old son and her youngest daughter has schizophrenia. Judith Mcneil has a brother who is 37 with no history of cancer and one son who is 81.  Judith Mcneil father was diagnosed with colon cancer in  His mid 2s and died at 19. He had one brother and two sisters. One of his sisters had breast cancer diagnosed in her 57s and passed away mid 18s. Judith Mcneil's paternal cousins do not have a cancer history. Judith Mcneil's paternal grandmother was diagnosed with breast cancer in her 25s and died in her 33s. Her grandfather died at 45.  Judith Mcneil mother died at 23 from Alzheimer's. She had one full brother, one half brother through her mom, and 2 half brothers and a sister through her dad. Judith Mcneil estimates she has about 25 maternal cousins and does not know of any cancer history. Her maternal grandfather passed away at 43 and her maternal grandmother passed away at 78, no cancer for either of them.   Judith Mcneil is  unaware of previous family history of genetic testing for hereditary cancer risks. Patient's maternal ancestors are of English/Irish descent, and paternal ancestors are of English/Irish descent. There no reported Ashkenazi Jewish ancestry. There is no known consanguinity.   GENETIC TEST RESULTS: Genetic testing performed through [facility]'s Breast Cancer STAT Panel and Common Hereditary Cancers Panel reported out  on 07/21/2018 showed no pathogenic mutations. The STAT Breast cancer panel offered by [facility] includes sequencing and rearrangement analysis for the following 9 genes:  ATM, BRCA1, BRCA2, CDH1, CHEK2, PALB2, PTEN, STK11 and TP53.  The Common Hereditary Cancer Panel offered by [facility] includes sequencing and/or deletion duplication testing of the following 47 genes: APC, ATM, AXIN2, BARD1, BMPR1A, BRCA1, BRCA2, BRIP1, CDH1, CDKN2A (p14ARF), CDKN2A (p16INK4a), CKD4, CHEK2, CTNNA1, DICER1, EPCAM (Deletion/duplication testing only), GREM1 (promoter region deletion/duplication testing only), KIT, MEN1, MLH1, MSH2, MSH3, MSH6, MUTYH, NBN, NF1, NHTL1, PALB2, PDGFRA, PMS2, POLD1, POLE, PTEN, RAD50, RAD51C, RAD51D, SDHB, SDHC, SDHD, SMAD4, SMARCA4. STK11, TP53, TSC1, TSC2, and VHL.  The following genes were evaluated for sequence changes only: SDHA and HOXB13 c.251G>A variant only.  The test report will be scanned into EP[employer] and will be located under the Molecular Pathology section of the Results Review tab. A portion of the result report is included below for reference.     We discussed with Judith Mcneil that because current genetic testing is not perfect, it is possible there may be a gene mutation in one of these genes that current testing cannot detect, but that chance is small.  We also discussed, that there could be another gene that has not yet been discovered, or that we have not yet tested, that is responsible for the cancer diagnoses in the family. It is also possible there is a hereditary cause for the cancer in the family that Judith Mcneil did not inherit and therefore was not identified in her testing.  Therefore, it is important to remain in touch with cancer genetics in the future so that we can continue to offer Judith Mcneil the most up to date genetic testing.    ADDITIONAL GENETIC TESTING: We discussed with Judith Mcneil that there are other genes that are associated with increased cancer risk  that can be analyzed. The laboratories that offer this testing look at these additional genes via a hereditary cancer gene panel. Should Judith Mcneil wish to pursue additional genetic testing, we are happy to discuss and coordinate this testing, at any time.    CANCER SCREENING RECOMMENDATIONS: This negative result means that we were unable to identify a hereditary cause for her personal and family history of cancer at this time.  This result does not rule out a hereditary cause for personal and family history of breast cancer.It is still possible that there could be genetic mutations that are undetectable by current technology, or genetic mutations in genes that have not been tested or identified to increase cancer risk.  Judith Mcneil's test result is considered negative (normal).  This means that we have not identified a hereditary cause for her personal and family history of cancer at this time.    This result indicates that it is unlikely Judith Mcneil has an increased risk for a future cancer due to a mutation in one of these genes. This normal test also suggests that Ms. Buehler's cancer was most likely not due to an inherited predisposition associated with one of these genes.  Most cancers happen by chance and this negative test suggests that her cancer may fall  into this category.  Therefore, it is recommended she continue to follow the cancer management and screening guidelines provided by her oncology and primary healthcare provider. Other factors such as her personal and family history may still affect her cancer risk.  RECOMMENDATIONS FOR FAMILY MEMBERS:  Relatives in this family might be at some increased risk of developing cancer, over the general population risk, simply due to the family history of cancer.  We recommended women in this family have a yearly mammogram beginning at age 44, or 14 years younger than the earliest onset of cancer, an annual clinical breast exam, and perform  monthly breast self-exams. Women in this family should also have a gynecological exam as recommended by their primary provider. All family members should have a colonoscopy by age 40 (or as directed by their doctors).  All family members should inform their physicians about the family history of cancer so their doctors can make the most appropriate screening recommendations for them.   It is also possible there is a hereditary cause for the cancer in Ms. Hayward's family that she did not inherit and therefore was not identified in her.   We recommended her paternal cousins have genetic counseling and testing. Ms. Abbett will let us know if we can be of any assistance in coordinating genetic counseling and/or testing for these family members.   FOLLOW-UP: Lastly, we discussed with Ms. Ceballos that cancer genetics is a rapidly advancing field and it is possible that new genetic tests will be appropriate for her and/or her family members in the future. We encouraged her to remain in contact with cancer genetics on an annual basis so we can update her personal and family histories and let her know of advances in cancer genetics that may benefit this family.   Our contact number was provided. Ms. Imhoff questions were answered to her satisfaction, and she knows she is welcome to call us at anytime with additional questions or concerns.   Epimenio Foot, MS Genetic Counselor [email or web address] Phone: [phone]

## 2018-07-23 ENCOUNTER — Encounter: Payer: Self-pay | Admitting: Licensed Clinical Social Worker

## 2018-07-25 ENCOUNTER — Other Ambulatory Visit: Payer: Self-pay

## 2018-07-25 ENCOUNTER — Encounter (HOSPITAL_BASED_OUTPATIENT_CLINIC_OR_DEPARTMENT_OTHER): Payer: Self-pay | Admitting: *Deleted

## 2018-07-29 ENCOUNTER — Ambulatory Visit (HOSPITAL_COMMUNITY): Payer: BLUE CROSS/BLUE SHIELD

## 2018-07-30 ENCOUNTER — Ambulatory Visit
Admission: RE | Admit: 2018-07-30 | Discharge: 2018-07-30 | Disposition: A | Discharge status: Home / Self Care | Payer: BLUE CROSS/BLUE SHIELD | Source: Ambulatory Visit | Attending: General Surgery | Admitting: General Surgery

## 2018-07-30 DIAGNOSIS — C50912 Malignant neoplasm of unspecified site of left female breast: Secondary | ICD-10-CM

## 2018-07-30 NOTE — Progress Notes (Signed)
Ensure pre drink given with instructions to drink by 5:30 morning of surgery. Pt also given Hibiclens surgical scrub with instructions for use. Pt. Verbalizes understanding.

## 2018-08-01 ENCOUNTER — Ambulatory Visit (HOSPITAL_BASED_OUTPATIENT_CLINIC_OR_DEPARTMENT_OTHER): Payer: BLUE CROSS/BLUE SHIELD | Admitting: Anesthesiology

## 2018-08-01 ENCOUNTER — Other Ambulatory Visit: Payer: Self-pay

## 2018-08-01 ENCOUNTER — Ambulatory Visit (HOSPITAL_COMMUNITY): Payer: BLUE CROSS/BLUE SHIELD

## 2018-08-01 ENCOUNTER — Encounter (HOSPITAL_BASED_OUTPATIENT_CLINIC_OR_DEPARTMENT_OTHER)
Admission: RE | Disposition: A | Discharge status: Home / Self Care | Payer: Self-pay | Source: Ambulatory Visit | Attending: General Surgery

## 2018-08-01 ENCOUNTER — Ambulatory Visit
Admission: RE | Admit: 2018-08-01 | Discharge: 2018-08-01 | Disposition: A | Discharge status: Home / Self Care | Payer: BLUE CROSS/BLUE SHIELD | Source: Ambulatory Visit | Attending: General Surgery | Admitting: General Surgery

## 2018-08-01 ENCOUNTER — Encounter (HOSPITAL_BASED_OUTPATIENT_CLINIC_OR_DEPARTMENT_OTHER): Payer: Self-pay | Admitting: *Deleted

## 2018-08-01 ENCOUNTER — Ambulatory Visit (HOSPITAL_COMMUNITY)
Admission: RE | Admit: 2018-08-01 | Discharge: 2018-08-01 | Disposition: A | Discharge status: Home / Self Care | Payer: BLUE CROSS/BLUE SHIELD | Source: Ambulatory Visit | Attending: General Surgery | Admitting: General Surgery

## 2018-08-01 ENCOUNTER — Ambulatory Visit (HOSPITAL_BASED_OUTPATIENT_CLINIC_OR_DEPARTMENT_OTHER)
Admission: RE | Admit: 2018-08-01 | Discharge: 2018-08-01 | Disposition: A | Discharge status: Home / Self Care | Payer: BLUE CROSS/BLUE SHIELD | Source: Ambulatory Visit | Attending: General Surgery | Admitting: General Surgery

## 2018-08-01 DIAGNOSIS — C50912 Malignant neoplasm of unspecified site of left female breast: Secondary | ICD-10-CM

## 2018-08-01 DIAGNOSIS — F419 Anxiety disorder, unspecified: Secondary | ICD-10-CM | POA: Diagnosis not present

## 2018-08-01 DIAGNOSIS — D0511 Intraductal carcinoma in situ of right breast: Secondary | ICD-10-CM | POA: Diagnosis not present

## 2018-08-01 DIAGNOSIS — N6012 Diffuse cystic mastopathy of left breast: Secondary | ICD-10-CM | POA: Diagnosis not present

## 2018-08-01 DIAGNOSIS — L905 Scar conditions and fibrosis of skin: Secondary | ICD-10-CM | POA: Insufficient documentation

## 2018-08-01 DIAGNOSIS — Z170 Estrogen receptor positive status [ER+]: Secondary | ICD-10-CM | POA: Insufficient documentation

## 2018-08-01 DIAGNOSIS — Z79899 Other long term (current) drug therapy: Secondary | ICD-10-CM | POA: Diagnosis not present

## 2018-08-01 DIAGNOSIS — R0902 Hypoxemia: Secondary | ICD-10-CM

## 2018-08-01 HISTORY — DX: Other specified postprocedural states: Z98.890

## 2018-08-01 HISTORY — DX: Anxiety disorder, unspecified: F41.9

## 2018-08-01 HISTORY — DX: Nausea with vomiting, unspecified: R11.2

## 2018-08-01 HISTORY — PX: BREAST LUMPECTOMY WITH RADIOACTIVE SEED AND SENTINEL LYMPH NODE BIOPSY: SHX6550

## 2018-08-01 HISTORY — PX: BREAST LUMPECTOMY WITH RADIOACTIVE SEED LOCALIZATION: SHX6424

## 2018-08-01 SURGERY — BREAST LUMPECTOMY WITH RADIOACTIVE SEED AND SENTINEL LYMPH NODE BIOPSY
Anesthesia: Regional | Site: Breast | Laterality: Right

## 2018-08-01 MED ORDER — EPHEDRINE 5 MG/ML INJ
INTRAVENOUS | Status: AC
  Filled 2018-08-01: qty 10

## 2018-08-01 MED ORDER — SODIUM CHLORIDE 0.9 % IJ SOLN
INTRAVENOUS | Status: DC | PRN
  Administered 2018-08-01: 5 mL via SUBCUTANEOUS

## 2018-08-01 MED ORDER — TECHNETIUM TC 99M SULFUR COLLOID FILTERED
1.0000 | Freq: Once | INTRAVENOUS | Status: AC | PRN
  Administered 2018-08-01: 1 via INTRADERMAL

## 2018-08-01 MED ORDER — ONDANSETRON HCL 4 MG/2ML IJ SOLN
INTRAMUSCULAR | Status: DC | PRN
  Administered 2018-08-01: 4 mg via INTRAVENOUS

## 2018-08-01 MED ORDER — ACETAMINOPHEN 500 MG PO TABS
ORAL_TABLET | ORAL | Status: AC
  Filled 2018-08-01: qty 2

## 2018-08-01 MED ORDER — BUPIVACAINE-EPINEPHRINE (PF) 0.5% -1:200000 IJ SOLN
INTRAMUSCULAR | Status: DC | PRN
  Administered 2018-08-01: 18 mL

## 2018-08-01 MED ORDER — CEFAZOLIN SODIUM-DEXTROSE 2-4 GM/100ML-% IV SOLN
INTRAVENOUS | Status: AC
  Filled 2018-08-01: qty 100

## 2018-08-01 MED ORDER — PROPOFOL 500 MG/50ML IV EMUL
INTRAVENOUS | Status: AC
  Filled 2018-08-01: qty 50

## 2018-08-01 MED ORDER — SCOPOLAMINE 1 MG/3DAYS TD PT72
1.0000 | MEDICATED_PATCH | Freq: Once | TRANSDERMAL | Status: AC | PRN
  Administered 2018-08-01: 1 via TRANSDERMAL

## 2018-08-01 MED ORDER — MIDAZOLAM HCL 2 MG/2ML IJ SOLN: 1.0000 mg | INTRAMUSCULAR | Status: DC | PRN

## 2018-08-01 MED ORDER — DEXAMETHASONE SODIUM PHOSPHATE 10 MG/ML IJ SOLN
INTRAMUSCULAR | Status: AC
  Filled 2018-08-01: qty 1

## 2018-08-01 MED ORDER — PHENYLEPHRINE HCL 10 MG/ML IJ SOLN
INTRAMUSCULAR | Status: AC
  Filled 2018-08-01: qty 1

## 2018-08-01 MED ORDER — PROPOFOL 10 MG/ML IV BOLUS
INTRAVENOUS | Status: DC | PRN
  Administered 2018-08-01: 170 mg via INTRAVENOUS

## 2018-08-01 MED ORDER — FENTANYL CITRATE (PF) 100 MCG/2ML IJ SOLN
INTRAMUSCULAR | Status: AC
  Filled 2018-08-01: qty 2

## 2018-08-01 MED ORDER — ACETAMINOPHEN 500 MG PO TABS
1000.0000 mg | ORAL_TABLET | ORAL | Status: AC
  Administered 2018-08-01: 1000 mg via ORAL

## 2018-08-01 MED ORDER — LIDOCAINE 2% (20 MG/ML) 5 ML SYRINGE
INTRAMUSCULAR | Status: AC
  Filled 2018-08-01: qty 5

## 2018-08-01 MED ORDER — CELECOXIB 200 MG PO CAPS
200.0000 mg | ORAL_CAPSULE | ORAL | Status: AC
  Administered 2018-08-01: 200 mg via ORAL

## 2018-08-01 MED ORDER — LIDOCAINE 2% (20 MG/ML) 5 ML SYRINGE
INTRAMUSCULAR | Status: DC | PRN
  Administered 2018-08-01: 50 mg via INTRAVENOUS

## 2018-08-01 MED ORDER — CHLORHEXIDINE GLUCONATE CLOTH 2 % EX PADS: 6.0000 | MEDICATED_PAD | Freq: Once | CUTANEOUS | Status: DC

## 2018-08-01 MED ORDER — SCOPOLAMINE 1 MG/3DAYS TD PT72
MEDICATED_PATCH | TRANSDERMAL | Status: AC
  Filled 2018-08-01: qty 1

## 2018-08-01 MED ORDER — ONDANSETRON HCL 4 MG/2ML IJ SOLN
INTRAMUSCULAR | Status: AC
  Filled 2018-08-01: qty 2

## 2018-08-01 MED ORDER — BUPIVACAINE-EPINEPHRINE (PF) 0.5% -1:200000 IJ SOLN
INTRAMUSCULAR | Status: DC | PRN
  Administered 2018-08-01: 30 mL

## 2018-08-01 MED ORDER — BUPIVACAINE-EPINEPHRINE (PF) 0.5% -1:200000 IJ SOLN
INTRAMUSCULAR | Status: AC
  Filled 2018-08-01: qty 30

## 2018-08-01 MED ORDER — CEFAZOLIN SODIUM-DEXTROSE 2-4 GM/100ML-% IV SOLN
2.0000 g | INTRAVENOUS | Status: AC
  Administered 2018-08-01: 2 g via INTRAVENOUS

## 2018-08-01 MED ORDER — FENTANYL CITRATE (PF) 100 MCG/2ML IJ SOLN
25.0000 ug | INTRAMUSCULAR | Status: DC | PRN
  Administered 2018-08-01: 25 ug via INTRAVENOUS

## 2018-08-01 MED ORDER — GABAPENTIN 300 MG PO CAPS
300.0000 mg | ORAL_CAPSULE | ORAL | Status: AC
  Administered 2018-08-01: 300 mg via ORAL

## 2018-08-01 MED ORDER — LACTATED RINGERS IV SOLN
INTRAVENOUS | Status: DC
  Administered 2018-08-01: 09:00:00 via INTRAVENOUS

## 2018-08-01 MED ORDER — FENTANYL CITRATE (PF) 100 MCG/2ML IJ SOLN
INTRAMUSCULAR | Status: AC
Start: 2018-08-01 — End: ?
  Filled 2018-08-01: qty 2

## 2018-08-01 MED ORDER — MIDAZOLAM HCL 2 MG/2ML IJ SOLN
INTRAMUSCULAR | Status: AC
  Filled 2018-08-01: qty 2

## 2018-08-01 MED ORDER — OXYCODONE HCL 5 MG PO TABS: 5.0000 mg | ORAL_TABLET | Freq: Four times a day (QID) | ORAL | 0 refills | Status: DC | PRN

## 2018-08-01 MED ORDER — CELECOXIB 200 MG PO CAPS
ORAL_CAPSULE | ORAL | Status: AC
  Filled 2018-08-01: qty 1

## 2018-08-01 MED ORDER — SODIUM CHLORIDE 0.9 % IJ SOLN
INTRAMUSCULAR | Status: AC
  Filled 2018-08-01: qty 10

## 2018-08-01 MED ORDER — DEXAMETHASONE SODIUM PHOSPHATE 4 MG/ML IJ SOLN
INTRAMUSCULAR | Status: DC | PRN
  Administered 2018-08-01: 10 mg via INTRAVENOUS

## 2018-08-01 MED ORDER — GABAPENTIN 300 MG PO CAPS
ORAL_CAPSULE | ORAL | Status: AC
  Filled 2018-08-01: qty 1

## 2018-08-01 MED ORDER — EPHEDRINE SULFATE 50 MG/ML IJ SOLN
INTRAMUSCULAR | Status: DC | PRN
  Administered 2018-08-01: 15 mg via INTRAVENOUS

## 2018-08-01 MED ORDER — SODIUM CHLORIDE 0.9 % IV SOLN
INTRAVENOUS | Status: DC | PRN
  Administered 2018-08-01: 50 ug/min via INTRAVENOUS

## 2018-08-01 MED ORDER — METHYLENE BLUE 0.5 % INJ SOLN
INTRAVENOUS | Status: AC
  Filled 2018-08-01: qty 10

## 2018-08-01 MED ORDER — BUPIVACAINE-EPINEPHRINE (PF) 0.25% -1:200000 IJ SOLN
INTRAMUSCULAR | Status: AC
  Filled 2018-08-01: qty 30

## 2018-08-01 MED ORDER — ONDANSETRON HCL 4 MG/2ML IJ SOLN
4.0000 mg | Freq: Once | INTRAMUSCULAR | Status: AC | PRN
  Administered 2018-08-01: 4 mg via INTRAVENOUS

## 2018-08-01 MED ORDER — FENTANYL CITRATE (PF) 100 MCG/2ML IJ SOLN
50.0000 ug | INTRAMUSCULAR | Status: AC | PRN
  Administered 2018-08-01 (×3): 50 ug via INTRAVENOUS

## 2018-08-01 SURGICAL SUPPLY — 54 items
ADH SKN CLS APL DERMABOND .7 (GAUZE/BANDAGES/DRESSINGS) ×4
BINDER BREAST XLRG (GAUZE/BANDAGES/DRESSINGS) ×1 IMPLANT
BINDER BREAST XXLRG (GAUZE/BANDAGES/DRESSINGS) IMPLANT
BLADE SURG 15 STRL LF DISP TIS (BLADE) ×2 IMPLANT
BLADE SURG 15 STRL SS (BLADE) ×6
CANISTER SUC SOCK COL 7IN (MISCELLANEOUS) IMPLANT
CANISTER SUCT 1200ML W/VALVE (MISCELLANEOUS) ×1 IMPLANT
CHLORAPREP W/TINT 26ML (MISCELLANEOUS) ×4 IMPLANT
CLIP VESOCCLUDE MED 6/CT (CLIP) ×1 IMPLANT
CLIP VESOCCLUDE SM WIDE 6/CT (CLIP) ×1 IMPLANT
COVER BACK TABLE 60X90IN (DRAPES) ×3 IMPLANT
COVER MAYO STAND STRL (DRAPES) ×3 IMPLANT
COVER PROBE W GEL 5X96 (DRAPES) ×3 IMPLANT
DECANTER SPIKE VIAL GLASS SM (MISCELLANEOUS) ×1 IMPLANT
DERMABOND ADVANCED (GAUZE/BANDAGES/DRESSINGS) ×2
DERMABOND ADVANCED .7 DNX12 (GAUZE/BANDAGES/DRESSINGS) ×2 IMPLANT
DEVICE DUBIN W/COMP PLATE 8390 (MISCELLANEOUS) ×4 IMPLANT
DRAPE LAPAROSCOPIC ABDOMINAL (DRAPES) ×3 IMPLANT
DRAPE UTILITY XL STRL (DRAPES) ×3 IMPLANT
ELECT COATED BLADE 2.86 ST (ELECTRODE) ×3 IMPLANT
ELECT REM PT RETURN 9FT ADLT (ELECTROSURGICAL) ×3
ELECTRODE REM PT RTRN 9FT ADLT (ELECTROSURGICAL) ×2 IMPLANT
GLOVE BIOGEL PI IND STRL 7.0 (GLOVE) IMPLANT
GLOVE BIOGEL PI IND STRL 8 (GLOVE) ×2 IMPLANT
GLOVE BIOGEL PI INDICATOR 7.0 (GLOVE) ×2
GLOVE BIOGEL PI INDICATOR 8 (GLOVE) ×1
GLOVE ECLIPSE 7.5 STRL STRAW (GLOVE) ×5 IMPLANT
GLOVE SURG SYN 7.5  E (GLOVE) ×2
GLOVE SURG SYN 7.5 E (GLOVE) ×4 IMPLANT
GLOVE SURG SYN 7.5 PF PI (GLOVE) IMPLANT
GOWN STRL REUS W/ TWL LRG LVL3 (GOWN DISPOSABLE) ×2 IMPLANT
GOWN STRL REUS W/ TWL XL LVL3 (GOWN DISPOSABLE) ×2 IMPLANT
GOWN STRL REUS W/TWL LRG LVL3 (GOWN DISPOSABLE)
GOWN STRL REUS W/TWL XL LVL3 (GOWN DISPOSABLE) ×6
ILLUMINATOR WAVEGUIDE N/F (MISCELLANEOUS) IMPLANT
KIT MARKER MARGIN INK (KITS) ×3 IMPLANT
LIGHT WAVEGUIDE WIDE FLAT (MISCELLANEOUS) IMPLANT
NDL HYPO 25X1 1.5 SAFETY (NEEDLE) ×4 IMPLANT
NDL SAFETY ECLIPSE 18X1.5 (NEEDLE) ×2 IMPLANT
NEEDLE HYPO 18GX1.5 SHARP (NEEDLE) ×3
NEEDLE HYPO 25X1 1.5 SAFETY (NEEDLE) ×6 IMPLANT
NS IRRIG 1000ML POUR BTL (IV SOLUTION) ×1 IMPLANT
PACK BASIN DAY SURGERY FS (CUSTOM PROCEDURE TRAY) ×3 IMPLANT
PENCIL BUTTON HOLSTER BLD 10FT (ELECTRODE) ×3 IMPLANT
SLEEVE SCD COMPRESS KNEE MED (MISCELLANEOUS) ×3 IMPLANT
SPONGE LAP 18X18 RF (DISPOSABLE) ×1 IMPLANT
SPONGE LAP 4X18 RFD (DISPOSABLE) ×3 IMPLANT
SUT MON AB 5-0 PS2 18 (SUTURE) ×4 IMPLANT
SUT VICRYL 3-0 CR8 SH (SUTURE) ×4 IMPLANT
SYR CONTROL 10ML LL (SYRINGE) ×6 IMPLANT
TOWEL GREEN STERILE FF (TOWEL DISPOSABLE) ×3 IMPLANT
TOWEL OR NON WOVEN STRL DISP B (DISPOSABLE) ×2 IMPLANT
TUBE CONNECTING 20X1/4 (TUBING) ×1 IMPLANT
YANKAUER SUCT BULB TIP NO VENT (SUCTIONS) ×1 IMPLANT

## 2018-08-01 NOTE — Discharge Instructions (Signed)
[place] Office Phone Number [phone]  BREAST BIOPSY/ LUMPECTOMY: POST OP INSTRUCTIONS  Always review your discharge instruction sheet given to you by the facility where your surgery was performed.  IF YOU HAVE DISABILITY OR FAMILY LEAVE FORMS, YOU MUST BRING THEM TO THE OFFICE FOR PROCESSING.  DO NOT GIVE THEM TO YOUR DOCTOR.  1. A prescription for pain medication may be given to you upon discharge.  Take your pain medication as prescribed, if needed.  If narcotic pain medicine is not needed, then you may take acetaminophen (Tylenol) or ibuprofen (Advil) as needed. 1,000mg  of Tylenol given at 8:08am 2. Take your usually prescribed medications unless otherwise directed 3. If you need a refill on your pain medication, please contact your pharmacy.  They will contact our office to request authorization.  Prescriptions will not be filled after 5pm or on week-ends. 4. You should eat very light the first 24 hours after surgery, such as soup, crackers, pudding, etc.  Resume your normal diet the day after surgery. 5. Most patients will experience some swelling and bruising in the breast.  Ice packs and a good support bra will help.  Swelling and bruising can take several days to resolve.  6. It is common to experience some constipation if taking pain medication after surgery.  Increasing fluid intake and taking a stool softener will usually help or prevent this problem from occurring.  A mild laxative (Milk of Magnesia or Miralax) should be taken according to package directions if there are no bowel movements after 48 hours. 7. Unless discharge instructions indicate otherwise, you may remove your bandages 24-48 hours after surgery, and you may shower at that time.  You may have steri-strips (small skin tapes) in place directly over the incision.  These strips should be left on the skin for 7-10 days.  If your surgeon used skin glue on the incision, you may shower in 24 hours.  The glue  will flake off over the next 2-3 weeks.  Any sutures or staples will be removed at the office during your follow-up visit. 8. ACTIVITIES:  You may resume regular daily activities (gradually increasing) beginning the next day.  Wearing a good support bra or sports bra minimizes pain and swelling.  You may have sexual intercourse when it is comfortable. a. You may drive when you no longer are taking prescription pain medication, you can comfortably wear a seatbelt, and you can safely maneuver your car and apply brakes. b. RETURN TO WORK:  ______________________________________________________________________________________ 9. You should see your doctor in the office for a follow-up appointment approximately two weeks after your surgery.  Your doctors nurse will typically make your follow-up appointment when she calls you with your pathology report.  Expect your pathology report 2-3 business days after your surgery.  You may call to check if you do not hear from [place] after three days. 10. OTHER INSTRUCTIONS: _______________________________________________________________________________________________ _____________________________________________________________________________________________________________________________________ _____________________________________________________________________________________________________________________________________ _____________________________________________________________________________________________________________________________________  WHEN TO CALL YOUR DOCTOR: 1. Fever over 101.0 2. Nausea and/or vomiting. 3. Extreme swelling or bruising. 4. Continued bleeding from incision. 5. Increased pain, redness, or drainage from the incision.  The clinic staff is available to answer your questions during regular business hours.  Please dont hesitate to call and ask to speak to one of the nurses for clinical concerns.  If you have a medical  emergency, go to the nearest emergency room or call 911.  A surgeon from [facility] is always on call at the hospital.  For further  questions, please visit [email or web address]    Post Anesthesia Home Care Instructions  Activity: Get plenty of rest for the remainder of the day. A responsible individual must stay with you for 24 hours following the procedure.  For the next 24 hours, DO NOT: -Drive a car -Paediatric nurse -Drink alcoholic beverages -Take any medication unless instructed by your physician -Make any legal decisions or sign important papers.  Meals: Start with liquid foods such as gelatin or soup. Progress to regular foods as tolerated. Avoid greasy, spicy, heavy foods. If nausea and/or vomiting occur, drink only clear liquids until the nausea and/or vomiting subsides. Call your physician if vomiting continues.  Special Instructions/Symptoms: Your throat may feel dry or sore from the anesthesia or the breathing tube placed in your throat during surgery. If this causes discomfort, gargle with warm salt water. The discomfort should disappear within 24 hours.  If you had a scopolamine patch placed behind your ear for the management of post- operative nausea and/or vomiting:  1. The medication in the patch is effective for 72 hours, after which it should be removed.  Wrap patch in a tissue and discard in the trash. Wash hands thoroughly with soap and water. 2. You may remove the patch earlier than 72 hours if you experience unpleasant side effects which may include dry mouth, dizziness or visual disturbances. 3. Avoid touching the patch. Wash your hands with soap and water after contact with the patch.

## 2018-08-01 NOTE — Anesthesia Procedure Notes (Signed)
Procedure Name: LMA Insertion Performed by: Romesha Scherer W, CRNA Pre-anesthesia Checklist: Patient identified, Emergency Drugs available, Suction available and Patient being monitored Patient Re-evaluated:Patient Re-evaluated prior to induction Oxygen Delivery Method: Circle system utilized Preoxygenation: Pre-oxygenation with 100% oxygen Induction Type: IV induction Ventilation: Mask ventilation without difficulty LMA: LMA inserted LMA Size: 4.0 Number of attempts: 1 Placement Confirmation: positive ETCO2 Tube secured with: Tape Dental Injury: Teeth and Oropharynx as per pre-operative assessment        

## 2018-08-01 NOTE — Progress Notes (Signed)
[place] for Nuclear medicine injections, pt tolerated procedure well.

## 2018-08-01 NOTE — Transfer of Care (Signed)
Immediate Anesthesia Transfer of Care Note  Patient: Judith Mcneil  Procedure(s) Performed: LEFT BREAST LUMPECTOMY WITH RADIOACTIVE SEED AND SENTINEL LYMPH NODE BIOPSY (Left Breast) RIGHT BREAST LUMPECTOMY X 2  WITH RADIOACTIVE SEED RIGHT LOCALIZATION X 2 (Right Breast)  Patient Location: PACU  Anesthesia Type:GA combined with regional for post-op pain  Level of Consciousness: awake and patient cooperative  Airway & Oxygen Therapy: Patient Spontanous Breathing and Patient connected to face mask oxygen  Post-op Assessment: Report given to RN and Post -op Vital signs reviewed and stable  Post vital signs: Reviewed and stable  Last Vitals:  Vitals Value Taken Time  BP    Temp    Pulse 103 08/01/2018 11:51 AM  Resp 19 08/01/2018 11:51 AM  SpO2 98 % 08/01/2018 11:51 AM  Vitals shown include unvalidated device data.  Last Pain:  Vitals:   08/01/18 0743  TempSrc: Oral         Complications: No apparent anesthesia complications

## 2018-08-01 NOTE — Interval H&P Note (Signed)
History and Physical Interval Note:  08/01/2018 9:16 AM  Judith Mcneil  has presented today for surgery, with the diagnosis of LEFT BREAST CANCER  The various methods of treatment have been discussed with the patient and family. After consideration of risks, benefits and other options for treatment, the patient has consented to  Procedure(s): LEFT BREAST LUMPECTOMY WITH RADIOACTIVE SEED AND SENTINEL LYMPH NODE BIOPSY (Left) RIGHT BREAST LUMPECTOMYX2  WITH RADIOACTIVE SEED RIGHT LOCALIZATION X2 (Right) as a surgical intervention .  The patient's history has been reviewed, patient examined, no change in status, stable for surgery.  I have reviewed the patient's chart and labs.  Questions were answered to the patient's satisfaction.     Darene Lamer Ronnisha Felber

## 2018-08-01 NOTE — Op Note (Signed)
Preoperative Diagnosis: Stage I invasive ductal carcinoma left breast, DCIS right breast, ADH right breast.  Postoprative Diagnosis: Same  Procedure: Procedure(s): New dye injection left breast, LEFT BREAST LUMPECTOMY WITH RADIOACTIVE SEED AND DEEP AXILLARY SENTINEL LYMPH NODE BIOPSY RIGHT BREAST LUMPECTOMY X 2  WITH RADIOACTIVE SEED RIGHT LOCALIZATION X 2   Surgeon: Excell Seltzer T   Assistants: None  Anesthesia:  General LMA anesthesia  Indications: Patient with recent abnormal mammogram and distortion in the left breast and biopsy showing invasive ductal carcinoma, ER positive.  Subsequent MRI showed some enhancement here but no definite mass.  However there were 2 small masses in the right breast that underwent biopsy with a superior mass showing intermediate grade DCIS ER positive and an upper outer quadrant mass showing atypical ductal hyperplasia.  With this constellation of findings after discussion we have elected to proceed with radioactive seed localized left breast lumpectomy and axillary sentinel lymph node biopsy as well as radioactive seed localized right breast lumpectomy x2.  The procedure and indications and risks have been discussed with the patient and family detailed elsewhere.    Procedure Detail: Patient had previously undergone accurate placement of radioactive seeds at the 2 clip sites in the right breast and the clip site in the left breast.  In the holding area she underwent injection of 1 mCi of technetium sulfur colloid intradermally around the left nipple.  She received preoperative IV antibiotics.  She was taken to the operating room, placed in the supine position on the operating table, and laryngeal mask general anesthesia induced.  The entire anterior chest, breasts and upper arms were widely sterilely prepped and draped.  Patient timeout was performed and correct procedures verified.  I approach the right side initially.  Review of the post biopsy films  showed the 2 seeds and marking clips were relatively close together within 2 to 3 cm.  The neoprobe was used to localize the seeds in the upper and slightly upper outer right breast.  A curvilinear incision was made at the superior areolar border and dissection carried into the sub-cutaneous tissue.  Skin and subcutaneous flap was raised for a short distance superiorly.  Using the neoprobe for guidance these areas were in close proximity and I elected to excise them both with a single lumpectomy.  The neoprobe was used for guidance and an approximately 4 to 5 cm specimen of breast tissue was excised around both seeds.  I tried to get somewhat wider margins on the more medial and superior seed at the area of DCIS.  The specimen was removed and inked for margins.  Specimen x-ray showed the seton marking clips well contained within the specimen.  Hemostasis was assured and the lumpectomy cavity was marked with clips.  The deep breast and subcutaneous tissue was closed with interrupted 3-0 Vicryl in the skin with subcuticular 5-0 Monocryl and Dermabond. Attention was turned to the right breast.  Examination of the axilla with the neoprobe showed minimal counts in the axilla and I therefore injected dilute methylene blue subcutaneously beneath the right nipple and massage this for several minutes.  The lumpectomy was approached initially.  The seed was localized in the slightly superior left breast.  A curvilinear incision was made at the areolar border and a short skin and subcutaneous flaps were raised superiorly.  Using the neoprobe for guidance I excised a globular approximately 4 cm specimen around the seed.  This was inked for margins.  Examination of the specimen with the neoprobe  showed the seed was somewhat closer to the anterior margin.  Specimen x-ray and an AP view showed the seed and marking clip centrally located.  I then excised an additional 1 cm anterior margin that was inked and sent as a separate  specimen.  Hemostasis was obtained with cautery and the lumpectomy cavity was marked with clips.  The deep breast and subtenons tissue was closed with interrupted 3-0 Vicryl. Attention was turned to the sentinel lymph node biopsy.  There were some minimal counts noted.  A transverse incision was made in a skin crease in the axilla and dissection carried down through subtendinous tissue.  Clavipectoral fascia was incised.  Palpation did not reveal any enlarged lymph nodes.  Initially I did not see any blue dye.  Using the neoprobe for guidance I did dissect with blunt dissection and cautery down onto a deep somewhat posterior node.  As it was exposed it did have blue dye staining.  It was excised with cautery.  Ex vivo of the node had counts of about 175 and blue staining.  There were essentially no background counts remaining in the axilla.  No palpable adenopathy.  Stasis was assured in the deep axillary and subtenons tissue was closed with interrupted 3-0 Vicryl.  Both skin incisions were closed with subcuticular 5-0 Monocryl and Dermabond.  Sponge needle and instrument counts were correct.    Findings: As above  Estimated Blood Loss:  Minimal         Drains: None  Blood Given: none          Specimens: #1 right breast lumpectomy oriented   #2 left breast lumpectomy oriented #3 additional left breast superior margin oriented   #4 hot blue left axillary sentinel lymph node        Complications:  * No complications entered in OR log *         Disposition: PACU - hemodynamically stable.         Condition: stable

## 2018-08-01 NOTE — Anesthesia Procedure Notes (Addendum)
Anesthesia Regional Block: Pectoralis block   Pre-Anesthetic Checklist: ,, timeout performed, Correct Patient, Correct Site, Correct Laterality, Correct Procedure, Correct Position, site marked, Risks and benefits discussed,  Surgical consent,  Pre-op evaluation,  At surgeon's request and post-op pain management  Laterality: Left  Prep: chloraprep       Needles:  Injection technique: Single-shot  Needle Type: Echogenic Needle     Needle Length: 9cm  Needle Gauge: 21     Additional Needles:   Procedures:,,,, ultrasound used (permanent image in chart),,,,  Narrative:  Start time: 08/01/2018 8:26 AM End time: 08/01/2018 8:33 AM Injection made incrementally with aspirations every 5 mL.  Performed by: Personally  Anesthesiologist: Suzette Battiest, MD

## 2018-08-01 NOTE — Progress Notes (Signed)
Assisted Dr. Rob Fitzgerald with left, ultrasound guided, pectoralis block. Side rails up, monitors on throughout procedure. See vital signs in flow sheet. Tolerated Procedure well. 

## 2018-08-01 NOTE — H&P (Signed)
History of Present Illness  The patient is a 62 year old female who presents with breast cancer. She is a post menopausal female referred by Dr. Curlene Dolphin for evaluation of recently diagnosed carcinoma of the left breast. She recently presented for a screening mamogram revealing a possible mass and an area of distortion in the left breast. Subsequent imaging included diagnostic mamogram showing the mass resolved with spot compression views but there was a persistent area of architectural distortion in the upper central left breast middle third without discrete mass and ultrasound showing no correlate to the mammographic abnormality and negative axilla. A stereotactic guided breast biopsy was performed on 06/19/2018 with pathology revealing invasive ductal carcinoma of the breast and associated LCIS. She is seen now in breast multidisciplinary clinic for initial treatment planning. She has experienced no breast symptoms, specifically lump or pain or discharge. She has a history of benign needle biopsy of the right breast  Findings at that time were the following: Tumor size: distortion and difficult to determine, approximately 1.5 cm Tumor grade: 2, Ki-67 3% Estrogen Receptor: positive Progesterone Receptor: positive Her-2 neu: negative Lymph node status: negative    Past Surgical History  Breast Biopsy  Bilateral. Tonsillectomy   Diagnostic Studies History  Colonoscopy  within last year Mammogram  within last year Pap Smear  1-5 years ago  Social History  Alcohol use  Occasional alcohol use. Caffeine use  Carbonated beverages. No drug use  Tobacco use  Never smoker.  Family History  Alcohol Abuse  Father. Arthritis  Brother, Mother. Breast Cancer  Family Members In General. Colon Polyps  Father. Hypertension  Mother.  Pregnancy / Birth History  Age at menarche  59 years. Age of menopause  >58 Contraceptive History  Intrauterine device, Oral  contraceptives. Gravida  2 Para  2 Regular periods   Other Problems  Anxiety Disorder     Review of Systems  General Not Present- Appetite Loss, Chills, Fatigue, Fever, Night Sweats, Weight Gain and Weight Loss. Skin Present- Change in Wart/Mole and Dryness. Not Present- Hives, Jaundice, New Lesions, Non-Healing Wounds, Rash and Ulcer. HEENT Present- Seasonal Allergies. Not Present- Earache, Hearing Loss, Hoarseness, Nose Bleed, Oral Ulcers, Ringing in the Ears, Sinus Pain, Sore Throat, Visual Disturbances, Wears glasses/contact lenses and Yellow Eyes. Respiratory Present- Snoring. Not Present- Bloody sputum, Chronic Cough, Difficulty Breathing and Wheezing. Breast Not Present- Breast Mass, Breast Pain, Nipple Discharge and Skin Changes. Cardiovascular Not Present- Chest Pain, Difficulty Breathing Lying Down, Leg Cramps, Palpitations, Rapid Heart Rate, Shortness of Breath and Swelling of Extremities. Gastrointestinal Present- Indigestion. Not Present- Abdominal Pain, Bloating, Bloody Stool, Change in Bowel Habits, Chronic diarrhea, Constipation, Difficulty Swallowing, Excessive gas, Gets full quickly at meals, Hemorrhoids, Nausea, Rectal Pain and Vomiting. Female Genitourinary Not Present- Frequency, Nocturia, Painful Urination, Pelvic Pain and Urgency. Musculoskeletal Not Present- Back Pain, Joint Pain, Joint Stiffness, Muscle Pain, Muscle Weakness and Swelling of Extremities. Neurological Not Present- Decreased Memory, Fainting, Headaches, Numbness, Seizures, Tingling, Tremor, Trouble walking and Weakness. Psychiatric Present- Anxiety. Not Present- Bipolar, Change in Sleep Pattern, Depression, Fearful and Frequent crying. Endocrine Not Present- Cold Intolerance, Excessive Hunger, Hair Changes, Heat Intolerance, Hot flashes and New Diabetes. Hematology Not Present- Blood Thinners, Easy Bruising, Excessive bleeding, Gland problems, HIV and Persistent Infections.   Physical Exam  The  physical exam findings are as follows: Note:General: Alert, well-developed and well nourished Caucasian female, in no distress Skin: Warm and dry without rash or infection. HEENT: No palpable masses or thyromegaly. Sclera  nonicteric. Pupils equal round and reactive. Lymph nodes: No cervical, supraclavicular, nodes palpable. Breasts: C cup. No palpable masses in either breast. No skin changes. No nipple inversion or crusting or discharge Lungs: Breath sounds clear and equal. No wheezing or increased work of breathing. Cardiovascular: Regular rate and rhythm without murmer. No JVD or edema. Peripheral pulses intact. Abdomen: Nondistended. Soft and nontender. No masses palpable. No organomegaly. No palpable hernias. Extremities: No edema or joint swelling or deformity. No chronic venous stasis changes. Neurologic: Alert and fully oriented. Gait normal. No focal weakness. Psychiatric: Normal mood and affect. Thought content appropriate with normal judgement and insight    Assessment & Plan  MALIGNANT NEOPLASM OF LEFT BREAST, STAGE 1, ESTROGEN RECEPTOR POSITIVE (C50.912) Impression: 62 year old female with a new diagnosis of cancer of the left breast, upper outer quadrant. Clinical stage Ia, ER +, PR +, HER-2 -. I discussed with the patient and her daughtertoday initial surgical treatment options. We discussed options of breast conservation with lumpectomy or total mastectomy and sentinal lymph node biopsy/dissection. Options for reconstruction were discussed. After discussion they have elected to proceed with conservation with lumpectomy and sentinel lymph node biopsy. we discussed preoperative MRI to more fully assess the area due to imaging showing only distortion and dense breast tissue. We discussed the indications and nature of the procedure, and expected recovery, in detail. Surgical risks including anesthetic complications, cardiorespiratory complications, bleeding, infection, wound healing  complications, blood clots, lymphedema, local and distant recurrence and possible need for further surgery based on the final pathology was discussed and understood. Chemotherapy, hormonal therapy and radiation therapy have been discussed. They have been provided with literature regarding the treatment of breast cancer. All questions were answered. They understand and agree to proceed and we will go ahead with scheduling.  Subsequently bilateral breast MRI was performed with the following results: IMPRESSION: 1. The biopsy marking clip at the site of biopsy proven malignancy is seen in the superior central left breast. There is no measurable enhancing mass.  2.  There are 2 indeterminate masses in the right breast.  3.  No evidence of lymphadenopathy in the bilateral axilla.  RECOMMENDATION: MRI guided biopsy is recommended for the 2 indeterminate right breast masses.  This subsequently revealed ductal carcinoma in situ, ER PR positive, intermediate to high-grade, in the superior right breast and atypical ductal hyperplasia in the upper outer right breast  With this finding and further discussion we have elected to proceed with bilateral lumpectomy for the invasive cancer on the left, the DCIS and the ADH with separate lumpectomies on the right.

## 2018-08-01 NOTE — Anesthesia Preprocedure Evaluation (Signed)
Anesthesia Evaluation  Patient identified by MRN, date of birth, ID band Patient awake    Reviewed: Allergy & Precautions, NPO status , Patient's Chart, lab work & pertinent test results  History of Anesthesia Complications (+) PONV  Airway Mallampati: II  TM Distance: >3 FB Neck ROM: Full    Dental  (+) Dental Advisory Given   Pulmonary neg pulmonary ROS,    breath sounds clear to auscultation       Cardiovascular negative cardio ROS   Rhythm:Regular Rate:Normal     Neuro/Psych Anxiety negative neurological ROS     GI/Hepatic negative GI ROS, Neg liver ROS,   Endo/Other  negative endocrine ROS  Renal/GU negative Renal ROS     Musculoskeletal   Abdominal   Peds  Hematology negative hematology ROS (+)   Anesthesia Other Findings   Reproductive/Obstetrics                             Lab Results  Component Value Date   WBC 6.9 06/25/2018   HGB 13.9 06/25/2018   HCT 41.7 06/25/2018   MCV 92.8 06/25/2018   PLT 262 06/25/2018   Lab Results  Component Value Date   CREATININE 0.95 06/25/2018   BUN 20 06/25/2018   NA 139 06/25/2018   K 4.4 06/25/2018   CL 108 06/25/2018   CO2 26 06/25/2018    Anesthesia Physical Anesthesia Plan  ASA: II  Anesthesia Plan: General   Post-op Pain Management: GA combined w/ Regional for post-op pain   Induction: Intravenous  PONV Risk Score and Plan: 4 or greater and Scopolamine patch - Pre-op, Midazolam, Dexamethasone, Ondansetron and Treatment may vary due to age or medical condition  Airway Management Planned: LMA  Additional Equipment:   Intra-op Plan:   Post-operative Plan: Extubation in OR  Informed Consent: I have reviewed the patients History and Physical, chart, labs and discussed the procedure including the risks, benefits and alternatives for the proposed anesthesia with the patient or authorized representative who has  indicated his/her understanding and acceptance.   Dental advisory given  Plan Discussed with: CRNA  Anesthesia Plan Comments:         Anesthesia Quick Evaluation

## 2018-08-02 NOTE — Anesthesia Postprocedure Evaluation (Signed)
Anesthesia Post Note  Patient: Judith Mcneil  Procedure(s) Performed: LEFT BREAST LUMPECTOMY WITH RADIOACTIVE SEED AND SENTINEL LYMPH NODE BIOPSY (Left Breast) RIGHT BREAST LUMPECTOMY X 2  WITH RADIOACTIVE SEED RIGHT LOCALIZATION X 2 (Right Breast)     Patient location during evaluation: PACU Anesthesia Type: General and Regional Level of consciousness: awake and alert Pain management: pain level controlled Vital Signs Assessment: post-procedure vital signs reviewed and stable Respiratory status: spontaneous breathing, nonlabored ventilation, respiratory function stable and patient connected to nasal cannula oxygen Cardiovascular status: blood pressure returned to baseline and stable Postop Assessment: no apparent nausea or vomiting Anesthetic complications: no    Last Vitals:  Vitals:   08/01/18 1400 08/01/18 1445  BP: 124/78 139/78  Pulse: 98 99  Resp: (!) 29 18  Temp:  36.7 C  SpO2: 96% 98%    Last Pain:  Vitals:   08/01/18 1445  TempSrc:   PainSc: 2                  Sheneika Walstad

## 2018-08-04 ENCOUNTER — Encounter (HOSPITAL_BASED_OUTPATIENT_CLINIC_OR_DEPARTMENT_OTHER): Payer: Self-pay | Admitting: General Surgery

## 2018-08-05 ENCOUNTER — Ambulatory Visit: Payer: Self-pay | Admitting: Hematology and Oncology

## 2018-08-11 ENCOUNTER — Inpatient Hospital Stay: Payer: BLUE CROSS/BLUE SHIELD | Attending: Hematology and Oncology | Admitting: Hematology and Oncology

## 2018-08-11 ENCOUNTER — Telehealth: Payer: Self-pay | Admitting: *Deleted

## 2018-08-11 DIAGNOSIS — Z79899 Other long term (current) drug therapy: Secondary | ICD-10-CM | POA: Diagnosis not present

## 2018-08-11 DIAGNOSIS — Z170 Estrogen receptor positive status [ER+]: Secondary | ICD-10-CM | POA: Insufficient documentation

## 2018-08-11 DIAGNOSIS — C50412 Malignant neoplasm of upper-outer quadrant of left female breast: Secondary | ICD-10-CM | POA: Diagnosis not present

## 2018-08-11 NOTE — Progress Notes (Signed)
Patient Care Team: Patient, No Pcp Per as PCP - General (General Practice) Excell Seltzer, MD as Consulting Physician (General Surgery) Nicholas Lose, MD as Consulting Physician (Hematology and Oncology) Kyung Rudd, MD as Consulting Physician (Radiation Oncology)  DIAGNOSIS:  Encounter Diagnosis  Name Primary?  . Malignant neoplasm of upper-outer quadrant of left breast in female, estrogen receptor positive ([place])     SUMMARY OF ONCOLOGIC HISTORY:   Malignant neoplasm of upper-outer quadrant of left breast in female, estrogen receptor positive ([place])   06/19/2018 Initial Diagnosis    Screening detected left breast mass and distortion on further evaluation of mass went away and the distortion did not have a sonographic correlate.  Stereotactic biopsy revealed grade 2 IDC ER 95%, PR 90%, Ki-67 3%, HER-2 negative ratio 1.09 along with LCIS TX N0 stage Ia clinical stage     Genetic Testing    Negative genetic testing on the [facility] Breast Cancer STAT panel and Common Hereditary Cancers Panel. The STAT Breast cancer panel offered by [facility] includes sequencing and rearrangement analysis for the following 9 genes:  ATM, BRCA1, BRCA2, CDH1, CHEK2, PALB2, PTEN, STK11 and TP53.  The Common Hereditary Cancer Panel offered by [facility] includes sequencing and/or deletion duplication testing of the following 47 genes: APC, ATM, AXIN2, BARD1, BMPR1A, BRCA1, BRCA2, BRIP1, CDH1, CDKN2A (p14ARF), CDKN2A (p16INK4a), CKD4, CHEK2, CTNNA1, DICER1, EPCAM (Deletion/duplication testing only), GREM1 (promoter region deletion/duplication testing only), KIT, MEN1, MLH1, MSH2, MSH3, MSH6, MUTYH, NBN, NF1, NHTL1, PALB2, PDGFRA, PMS2, POLD1, POLE, PTEN, RAD50, RAD51C, RAD51D, SDHB, SDHC, SDHD, SMAD4, SMARCA4. STK11, TP53, TSC1, TSC2, and VHL.  The following genes were evaluated for sequence changes only: SDHA and HOXB13 c.251G>A variant only.  The report date is 07/21/2018.    08/01/2018 Surgery    Left lumpectomy: IDC  grade 1, 1.2 cm, margins negative, 0/1 lymph node negative ER 94%, PR 90%, HER-2 negative ratio 1.09, Ki-67 3%, T1CN0 stage Ia; Right lumpectomy: DCIS high-grade 1.4 cm, broadly less than 0.1 cm to posterior margin, ER 100%, PR 100%, Tis NX stage 0     CHIEF COMPLIANT: Follow-up after recent bilateral lumpectomies  INTERVAL HISTORY: MICHAELIA BEILFUSS is a 62 year old lady with above-mentioned history of bilateral breast abnormalities underwent bilateral lumpectomies and is here today to discuss pathology report.  She is recovering very well from the recent surgeries.  She had genetic testing which was negative.  REVIEW OF SYSTEMS:   Constitutional: Denies fevers, chills or abnormal weight loss Eyes: Denies blurriness of vision Ears, nose, mouth, throat, and face: Denies mucositis or sore throat Respiratory: Denies cough, dyspnea or wheezes Cardiovascular: Denies palpitation, chest discomfort Gastrointestinal:  Denies nausea, heartburn or change in bowel habits Skin: Denies abnormal skin rashes Lymphatics: Denies new lymphadenopathy or easy bruising Neurological:Denies numbness, tingling or new weaknesses Behavioral/Psych: Mood is stable, no new changes  Extremities: No lower extremity edema Breast: Bilateral lumpectomies All other systems were reviewed with the patient and are negative.  I have reviewed the past medical history, past surgical history, social history and family history with the patient and they are unchanged from previous note.  ALLERGIES:  has No Known Allergies.  MEDICATIONS:  Current Outpatient Medications  Medication Sig Dispense Refill  . ALPRAZolam (XANAX) 0.25 MG tablet Take 0.25 mg by mouth at bedtime as needed for anxiety.    . Fish Oil OIL Take 1 capsule by mouth daily.    Marland Kitchen FLUoxetine (PROZAC) 10 MG capsule Take 10 mg by mouth daily.    Marland Kitchen  ibuprofen (ADVIL,MOTRIN) 200 MG tablet Take 400 mg by mouth every 6 (six) hours as needed.    . loratadine (CLARITIN)  10 MG tablet Take 10 mg by mouth daily.    Marland Kitchen oxyCODONE (OXY IR/ROXICODONE) 5 MG immediate release tablet Take 1 tablet (5 mg total) by mouth every 6 (six) hours as needed for moderate pain, severe pain or breakthrough pain. 12 tablet 0  . valACYclovir (VALTREX) 1000 MG tablet Take 1,000 mg by mouth daily as needed (outbreak).     No current facility-administered medications for this visit.     PHYSICAL EXAMINATION: ECOG PERFORMANCE STATUS: 1 - Symptomatic but completely ambulatory  Vitals:   08/11/18 0934  BP: 119/71  Pulse: 79  Resp: 18  Temp: 98.6 F (37 C)  SpO2: 98%   Filed Weights   08/11/18 0934  Weight: 177 lb 4.8 oz (80.4 kg)    GENERAL:alert, no distress and comfortable SKIN: skin color, texture, turgor are normal, no rashes or significant lesions EYES: normal, Conjunctiva are pink and non-injected, sclera clear OROPHARYNX:no exudate, no erythema and lips, buccal mucosa, and tongue normal  NECK: supple, thyroid normal size, non-tender, without nodularity LYMPH:  no palpable lymphadenopathy in the cervical, axillary or inguinal LUNGS: clear to auscultation and percussion with normal breathing effort HEART: regular rate & rhythm and no murmurs and no lower extremity edema ABDOMEN:abdomen soft, non-tender and normal bowel sounds MUSCULOSKELETAL:no cyanosis of digits and no clubbing  NEURO: alert & oriented x 3 with fluent speech, no focal motor/sensory deficits EXTREMITIES: No lower extremity edema  LABORATORY DATA:  I have reviewed the data as listed CMP Latest Ref Rng & Units 06/25/2018  Glucose 70 - 99 mg/dL 94  BUN 8 - 23 mg/dL 20  Creatinine 0.44 - 1.00 mg/dL 0.95  Sodium 135 - 145 mmol/L 139  Potassium 3.5 - 5.1 mmol/L 4.4  Chloride 98 - 111 mmol/L 108  CO2 22 - 32 mmol/L 26  Calcium 8.9 - 10.3 mg/dL 9.2  Total Protein 6.5 - 8.1 g/dL 7.0  Total Bilirubin 0.3 - 1.2 mg/dL 0.5  Alkaline Phos 38 - 126 U/L 90  AST 15 - 41 U/L 19  ALT 0 - 44 U/L 25    Lab  Results  Component Value Date   WBC 6.9 06/25/2018   HGB 13.9 06/25/2018   HCT 41.7 06/25/2018   MCV 92.8 06/25/2018   PLT 262 06/25/2018   NEUTROABS 4.4 06/25/2018    ASSESSMENT & PLAN:  Malignant neoplasm of upper-outer quadrant of left breast in female, estrogen receptor positive ([facility]) 08/01/2018:Left lumpectomy: IDC grade 1, 1.2 cm, margins negative, 0/1 lymph node negative ER 94%, PR 90%, HER-2 negative ratio 1.09, Ki-67 3%, T1CN0 stage Ia;  Right lumpectomy: DCIS high-grade 1.4 cm, broadly less than 0.1 cm to posterior margin, ER 100%, PR 100%, Tis NX stage 0  Pathology counseling: I discussed the final pathology report of the patient provided  a copy of this report. I discussed the margins as well as lymph node surgeries. We also discussed the final staging along with previously performed ER/PR and HER-2/neu testing. Because of close margins for DCIS on the right side she will need additional surgery.  This was discussed in the tumor board.  Recommendation: 1. Oncotype DX testing to determine if chemotherapy would be of any benefit followed by 2. Adjuvant radiation therapy followed by 3. Adjuvant antiestrogen therapy  Return to clinic based upon Oncotype DX testing.  If it is low risk I will see  the patient after radiation.    No orders of the defined types were placed in this encounter.  The patient has a good understanding of the overall plan. she agrees with it. she will call with any problems that may develop before the next visit here.   Harriette Ohara, MD 08/11/18

## 2018-08-11 NOTE — Assessment & Plan Note (Signed)
08/01/2018:Left lumpectomy: IDC grade 1, 1.2 cm, margins negative, 0/1 lymph node negative ER 94%, PR 90%, HER-2 negative ratio 1.09, Ki-67 3%, T1CN0 stage Ia;  Right lumpectomy: DCIS high-grade 1.4 cm, broadly less than 0.1 cm to posterior margin, ER 100%, PR 100%, Tis NX stage 0  Pathology counseling: I discussed the final pathology report of the patient provided  a copy of this report. I discussed the margins as well as lymph node surgeries. We also discussed the final staging along with previously performed ER/PR and HER-2/neu testing.  Recommendation: 1. Oncotype DX testing to determine if chemotherapy would be of any benefit followed by 2. Adjuvant radiation therapy followed by 3. Adjuvant antiestrogen therapy  Return to clinic based upon Oncotype DX testing.  If it is low risk I will see the patient after radiation.

## 2018-08-11 NOTE — Telephone Encounter (Signed)
Ordered oncotype per Dr. Gudena.  Faxed requisition to pathology and confirmed receipt. 

## 2018-08-12 ENCOUNTER — Telehealth: Payer: Self-pay | Admitting: Hematology and Oncology

## 2018-08-12 NOTE — Telephone Encounter (Signed)
Per 8/19 los, no new orders ° °

## 2018-08-13 ENCOUNTER — Encounter (HOSPITAL_BASED_OUTPATIENT_CLINIC_OR_DEPARTMENT_OTHER): Payer: Self-pay | Admitting: *Deleted

## 2018-08-13 ENCOUNTER — Other Ambulatory Visit: Payer: Self-pay

## 2018-08-18 ENCOUNTER — Ambulatory Visit: Payer: Self-pay | Admitting: General Surgery

## 2018-08-19 ENCOUNTER — Telehealth: Payer: Self-pay | Admitting: *Deleted

## 2018-08-19 ENCOUNTER — Encounter (HOSPITAL_COMMUNITY): Payer: Self-pay | Admitting: Hematology and Oncology

## 2018-08-19 DIAGNOSIS — C50412 Malignant neoplasm of upper-outer quadrant of left female breast: Secondary | ICD-10-CM

## 2018-08-19 DIAGNOSIS — Z170 Estrogen receptor positive status [ER+]: Secondary | ICD-10-CM

## 2018-08-19 NOTE — H&P (Signed)
History of Present Illness  The patient is a 62 year old female who presents with breast cancer. She is a post menopausal female referred by Dr. Curlene Dolphin for evaluation of recently diagnosed carcinoma of the left breast. She recently presented for a screening mamogram revealing a possible mass and an area of distortion in the left breast. Subsequent imaging included diagnostic mamogram showing the mass resolved with spot compression views but there was a persistent area of architectural distortion in the upper central left breast middle third without discrete mass and ultrasound showing no correlate to the mammographic abnormality and negative axilla.   Findings at that time were the following: Tumor size: distortion and difficult to determine, approximately 1.5 cm Tumor grade: 2, Ki-67 3% Estrogen Receptor: positive Progesterone Receptor: positive Her-2 neu: negative Lymph node status: negative  Subsequently preoperative bilateral breast MRI was obtained Right breast: In the superior right breast, middle to anterior depth (series 6, image 107), there is an irregular enhancing mass measuring 1.0 cm demonstrating mixed kinetics including washout. There is a similar but smaller mass in the upper-outer right breast, middle depth (series 6, image 122), measuring 7 mm, also demonstrating mixed kinetics. There was no change in the assessment of the left breast mass. MRI guided biopsies of the right breast revealed ADH and DCIS  Patient subsequently underwent bilateral lumpectomies Final pathology on the left revealed invasive ductal carcinoma 1.2 cm , negative margins and negative sentinel lymph node. On the right there was DCIS, high-grade, 1.4 cm with the posterior margin less than 0.1 cm broadly  I discussed these findings with the patient and have recommended reexcision of the close margin of DCIS on the right breast to obtain a widely negative margin.  Discussed the indications and  risks of infection or bleeding and wound healing problems and she agrees to proceed.  Past Surgical History  Breast Biopsy  Bilateral. Tonsillectomy   Diagnostic Studies History ( Colonoscopy  within last year Mammogram  within last year Pap Smear  1-5 years ago  Social History  Alcohol use  Occasional alcohol use. Caffeine use  Carbonated beverages. No drug use  Tobacco use  Never smoker.  Family History Alcohol Abuse  Father. Arthritis  Brother, Mother. Breast Cancer  Family Members In General. Colon Polyps  Father. Hypertension  Mother.  Pregnancy / Birth History Age at menarche  28 years. Age of menopause  >58 Contraceptive History  Intrauterine device, Oral contraceptives. Gravida  2 Para  2 Regular periods   Other Problems  Anxiety Disorder     Review of Systems  General Not Present- Appetite Loss, Chills, Fatigue, Fever, Night Sweats, Weight Gain and Weight Loss. Skin Present- Change in Wart/Mole and Dryness. Not Present- Hives, Jaundice, New Lesions, Non-Healing Wounds, Rash and Ulcer. HEENT Present- Seasonal Allergies. Not Present- Earache, Hearing Loss, Hoarseness, Nose Bleed, Oral Ulcers, Ringing in the Ears, Sinus Pain, Sore Throat, Visual Disturbances, Wears glasses/contact lenses and Yellow Eyes. Respiratory Present- Snoring. Not Present- Bloody sputum, Chronic Cough, Difficulty Breathing and Wheezing. Breast Not Present- Breast Mass, Breast Pain, Nipple Discharge and Skin Changes. Cardiovascular Not Present- Chest Pain, Difficulty Breathing Lying Down, Leg Cramps, Palpitations, Rapid Heart Rate, Shortness of Breath and Swelling of Extremities. Gastrointestinal Present- Indigestion. Not Present- Abdominal Pain, Bloating, Bloody Stool, Change in Bowel Habits, Chronic diarrhea, Constipation, Difficulty Swallowing, Excessive gas, Gets full quickly at meals, Hemorrhoids, Nausea, Rectal Pain and Vomiting. Female Genitourinary Not Present-  Frequency, Nocturia, Painful Urination, Pelvic Pain and Urgency. Musculoskeletal  Not Present- Back Pain, Joint Pain, Joint Stiffness, Muscle Pain, Muscle Weakness and Swelling of Extremities. Neurological Not Present- Decreased Memory, Fainting, Headaches, Numbness, Seizures, Tingling, Tremor, Trouble walking and Weakness. Psychiatric Present- Anxiety. Not Present- Bipolar, Change in Sleep Pattern, Depression, Fearful and Frequent crying. Endocrine Not Present- Cold Intolerance, Excessive Hunger, Hair Changes, Heat Intolerance, Hot flashes and New Diabetes. Hematology Not Present- Blood Thinners, Easy Bruising, Excessive bleeding, Gland problems, HIV and Persistent Infections.   Physical Exam  The physical exam findings are as follows: Note:General: Alert, well-developed and well nourished Caucasian female, in no distress Skin: Warm and dry without rash or infection. HEENT: No palpable masses or thyromegaly. Sclera nonicteric. Pupils equal round and reactive. Lymph nodes: No cervical, supraclavicular, nodes palpable. Breasts: C cup. Bilateral healing incisions without complicating factors Lungs: Breath sounds clear and equal. No wheezing or increased work of breathing. Cardiovascular: Regular rate and rhythm without murmer. No JVD or edema. Peripheral pulses intact. Abdomen: Nondistended. Soft and nontender. No masses palpable. No organomegaly. No palpable hernias. Extremities: No edema or joint swelling or deformity. No chronic venous stasis changes. Neurologic: Alert and fully oriented. Gait normal. No focal weakness. Psychiatric: Normal mood and affect. Thought content appropriate with normal judgement and insight    Status post bilateral lumpectomy and left axillary sentinel lymph node biopsy for stage I invasive ductal carcinoma of the left breast and DCIS of the right breast.  Close posterior margin on the DCIS broadly on the right and we plan to proceed with reexcision of the  posterior margin of her right lumpectomy

## 2018-08-19 NOTE — Telephone Encounter (Signed)
Received oncotype score of 19/6%. Physician team notified. Called pt with results and discussed no chemo recommended and next steps will be xrt after re-excision. Received verbal understanding. Referral placed for pt to see Dr. Lisbeth Renshaw.

## 2018-08-19 NOTE — Progress Notes (Signed)
Pre-surgical drink given with instruction to finish by 0815.  Patient verbalizes understanding.

## 2018-08-20 ENCOUNTER — Encounter (HOSPITAL_BASED_OUTPATIENT_CLINIC_OR_DEPARTMENT_OTHER)
Admission: RE | Disposition: A | Discharge status: Home / Self Care | Payer: Self-pay | Source: Ambulatory Visit | Attending: General Surgery

## 2018-08-20 ENCOUNTER — Encounter (HOSPITAL_BASED_OUTPATIENT_CLINIC_OR_DEPARTMENT_OTHER): Payer: Self-pay | Admitting: Certified Registered"

## 2018-08-20 ENCOUNTER — Ambulatory Visit (HOSPITAL_BASED_OUTPATIENT_CLINIC_OR_DEPARTMENT_OTHER): Payer: BLUE CROSS/BLUE SHIELD | Admitting: Anesthesiology

## 2018-08-20 ENCOUNTER — Ambulatory Visit (HOSPITAL_BASED_OUTPATIENT_CLINIC_OR_DEPARTMENT_OTHER)
Admission: RE | Admit: 2018-08-20 | Discharge: 2018-08-20 | Disposition: A | Discharge status: Home / Self Care | Payer: BLUE CROSS/BLUE SHIELD | Source: Ambulatory Visit | Attending: General Surgery | Admitting: General Surgery

## 2018-08-20 DIAGNOSIS — Z8371 Family history of colonic polyps: Secondary | ICD-10-CM | POA: Diagnosis not present

## 2018-08-20 DIAGNOSIS — Z803 Family history of malignant neoplasm of breast: Secondary | ICD-10-CM | POA: Insufficient documentation

## 2018-08-20 DIAGNOSIS — Z8249 Family history of ischemic heart disease and other diseases of the circulatory system: Secondary | ICD-10-CM | POA: Insufficient documentation

## 2018-08-20 DIAGNOSIS — E669 Obesity, unspecified: Secondary | ICD-10-CM | POA: Diagnosis not present

## 2018-08-20 DIAGNOSIS — D0511 Intraductal carcinoma in situ of right breast: Secondary | ICD-10-CM | POA: Insufficient documentation

## 2018-08-20 DIAGNOSIS — F419 Anxiety disorder, unspecified: Secondary | ICD-10-CM | POA: Diagnosis not present

## 2018-08-20 DIAGNOSIS — Z811 Family history of alcohol abuse and dependence: Secondary | ICD-10-CM | POA: Insufficient documentation

## 2018-08-20 DIAGNOSIS — Z8261 Family history of arthritis: Secondary | ICD-10-CM | POA: Diagnosis not present

## 2018-08-20 DIAGNOSIS — Z170 Estrogen receptor positive status [ER+]: Secondary | ICD-10-CM | POA: Diagnosis not present

## 2018-08-20 DIAGNOSIS — Z6830 Body mass index (BMI) 30.0-30.9, adult: Secondary | ICD-10-CM | POA: Diagnosis not present

## 2018-08-20 HISTORY — PX: RE-EXCISION OF BREAST LUMPECTOMY: SHX6048

## 2018-08-20 SURGERY — EXCISION, LESION, BREAST
Anesthesia: General | Site: Breast | Laterality: Right

## 2018-08-20 MED ORDER — 0.9 % SODIUM CHLORIDE (POUR BTL) OPTIME
TOPICAL | Status: DC | PRN
  Administered 2018-08-20: 150 mL

## 2018-08-20 MED ORDER — FENTANYL CITRATE (PF) 100 MCG/2ML IJ SOLN
25.0000 ug | INTRAMUSCULAR | Status: DC | PRN
  Administered 2018-08-20 (×2): 50 ug via INTRAVENOUS

## 2018-08-20 MED ORDER — CHLORHEXIDINE GLUCONATE CLOTH 2 % EX PADS: 6.0000 | MEDICATED_PAD | Freq: Once | CUTANEOUS | Status: DC

## 2018-08-20 MED ORDER — ONDANSETRON HCL 4 MG/2ML IJ SOLN
INTRAMUSCULAR | Status: DC | PRN
  Administered 2018-08-20: 4 mg via INTRAVENOUS

## 2018-08-20 MED ORDER — CEFAZOLIN SODIUM-DEXTROSE 2-4 GM/100ML-% IV SOLN
INTRAVENOUS | Status: AC
  Filled 2018-08-20: qty 100

## 2018-08-20 MED ORDER — FENTANYL CITRATE (PF) 100 MCG/2ML IJ SOLN
INTRAMUSCULAR | Status: AC
  Filled 2018-08-20: qty 2

## 2018-08-20 MED ORDER — BUPIVACAINE-EPINEPHRINE 0.5% -1:200000 IJ SOLN
INTRAMUSCULAR | Status: DC | PRN
  Administered 2018-08-20: 20 mL

## 2018-08-20 MED ORDER — GABAPENTIN 300 MG PO CAPS
300.0000 mg | ORAL_CAPSULE | ORAL | Status: AC
  Administered 2018-08-20: 300 mg via ORAL

## 2018-08-20 MED ORDER — FENTANYL CITRATE (PF) 100 MCG/2ML IJ SOLN
50.0000 ug | INTRAMUSCULAR | Status: DC | PRN
  Administered 2018-08-20 (×2): 50 ug via INTRAVENOUS

## 2018-08-20 MED ORDER — SCOPOLAMINE 1 MG/3DAYS TD PT72: 1.0000 | MEDICATED_PATCH | Freq: Once | TRANSDERMAL | Status: DC | PRN

## 2018-08-20 MED ORDER — GABAPENTIN 300 MG PO CAPS
ORAL_CAPSULE | ORAL | Status: AC
  Filled 2018-08-20: qty 1

## 2018-08-20 MED ORDER — CELECOXIB 200 MG PO CAPS
ORAL_CAPSULE | ORAL | Status: AC
  Filled 2018-08-20: qty 1

## 2018-08-20 MED ORDER — MIDAZOLAM HCL 2 MG/2ML IJ SOLN
INTRAMUSCULAR | Status: AC
  Filled 2018-08-20: qty 2

## 2018-08-20 MED ORDER — DEXAMETHASONE SODIUM PHOSPHATE 10 MG/ML IJ SOLN
INTRAMUSCULAR | Status: AC
  Filled 2018-08-20: qty 1

## 2018-08-20 MED ORDER — LIDOCAINE 2% (20 MG/ML) 5 ML SYRINGE
INTRAMUSCULAR | Status: AC
  Filled 2018-08-20: qty 15

## 2018-08-20 MED ORDER — CELECOXIB 200 MG PO CAPS
200.0000 mg | ORAL_CAPSULE | ORAL | Status: AC
  Administered 2018-08-20: 200 mg via ORAL

## 2018-08-20 MED ORDER — DEXAMETHASONE SODIUM PHOSPHATE 4 MG/ML IJ SOLN
INTRAMUSCULAR | Status: DC | PRN
  Administered 2018-08-20: 10 mg via INTRAVENOUS

## 2018-08-20 MED ORDER — MIDAZOLAM HCL 2 MG/2ML IJ SOLN
1.0000 mg | INTRAMUSCULAR | Status: DC | PRN
  Administered 2018-08-20: 2 mg via INTRAVENOUS

## 2018-08-20 MED ORDER — GLYCOPYRROLATE PF 0.2 MG/ML IJ SOSY
PREFILLED_SYRINGE | INTRAMUSCULAR | Status: AC
  Filled 2018-08-20: qty 2

## 2018-08-20 MED ORDER — ACETAMINOPHEN 500 MG PO TABS
ORAL_TABLET | ORAL | Status: AC
  Filled 2018-08-20: qty 2

## 2018-08-20 MED ORDER — ONDANSETRON HCL 4 MG/2ML IJ SOLN: 4.0000 mg | Freq: Once | INTRAMUSCULAR | Status: DC | PRN

## 2018-08-20 MED ORDER — LACTATED RINGERS IV SOLN
INTRAVENOUS | Status: DC
  Administered 2018-08-20: 11:00:00 via INTRAVENOUS

## 2018-08-20 MED ORDER — CEFAZOLIN SODIUM-DEXTROSE 2-4 GM/100ML-% IV SOLN
2.0000 g | INTRAVENOUS | Status: AC
  Administered 2018-08-20: 2 g via INTRAVENOUS

## 2018-08-20 MED ORDER — EPHEDRINE 5 MG/ML INJ
INTRAVENOUS | Status: AC
  Filled 2018-08-20: qty 20

## 2018-08-20 MED ORDER — ONDANSETRON HCL 4 MG/2ML IJ SOLN
INTRAMUSCULAR | Status: AC
  Filled 2018-08-20: qty 8

## 2018-08-20 MED ORDER — ACETAMINOPHEN 500 MG PO TABS
1000.0000 mg | ORAL_TABLET | ORAL | Status: AC
  Administered 2018-08-20: 1000 mg via ORAL

## 2018-08-20 MED ORDER — LIDOCAINE HCL (CARDIAC) PF 100 MG/5ML IV SOSY
PREFILLED_SYRINGE | INTRAVENOUS | Status: DC | PRN
  Administered 2018-08-20: 30 mg via INTRAVENOUS

## 2018-08-20 MED ORDER — PROPOFOL 10 MG/ML IV BOLUS
INTRAVENOUS | Status: DC | PRN
  Administered 2018-08-20: 100 mg via INTRAVENOUS

## 2018-08-20 MED ORDER — BUPIVACAINE-EPINEPHRINE (PF) 0.5% -1:200000 IJ SOLN
INTRAMUSCULAR | Status: AC
  Filled 2018-08-20: qty 30

## 2018-08-20 SURGICAL SUPPLY — 44 items
ADH SKN CLS APL DERMABOND .7 (GAUZE/BANDAGES/DRESSINGS) ×1
BINDER BREAST XLRG (GAUZE/BANDAGES/DRESSINGS) ×2 IMPLANT
BLADE SURG 15 STRL LF DISP TIS (BLADE) ×1 IMPLANT
BLADE SURG 15 STRL SS (BLADE) ×3
CANISTER SUCT 1200ML W/VALVE (MISCELLANEOUS) ×3 IMPLANT
CHLORAPREP W/TINT 26ML (MISCELLANEOUS) ×3 IMPLANT
CLIP VESOCCLUDE SM WIDE 6/CT (CLIP) ×2 IMPLANT
COVER BACK TABLE 60X90IN (DRAPES) ×3 IMPLANT
COVER MAYO STAND STRL (DRAPES) ×3 IMPLANT
DERMABOND ADVANCED (GAUZE/BANDAGES/DRESSINGS) ×2
DERMABOND ADVANCED .7 DNX12 (GAUZE/BANDAGES/DRESSINGS) ×1 IMPLANT
DEVICE DUBIN W/COMP PLATE 8390 (MISCELLANEOUS) IMPLANT
DRAPE LAPAROSCOPIC ABDOMINAL (DRAPES) ×3 IMPLANT
DRAPE UTILITY XL STRL (DRAPES) ×3 IMPLANT
ELECT COATED BLADE 2.86 ST (ELECTRODE) ×3 IMPLANT
ELECT REM PT RETURN 9FT ADLT (ELECTROSURGICAL) ×3
ELECTRODE REM PT RTRN 9FT ADLT (ELECTROSURGICAL) ×1 IMPLANT
GLOVE BIOGEL PI IND STRL 7.0 (GLOVE) IMPLANT
GLOVE BIOGEL PI IND STRL 8 (GLOVE) ×1 IMPLANT
GLOVE BIOGEL PI INDICATOR 7.0 (GLOVE) ×4
GLOVE BIOGEL PI INDICATOR 8 (GLOVE) ×2
GLOVE ECLIPSE 6.5 STRL STRAW (GLOVE) ×2 IMPLANT
GLOVE ECLIPSE 7.5 STRL STRAW (GLOVE) ×3 IMPLANT
GOWN STRL REUS W/ TWL LRG LVL3 (GOWN DISPOSABLE) ×1 IMPLANT
GOWN STRL REUS W/ TWL XL LVL3 (GOWN DISPOSABLE) ×1 IMPLANT
GOWN STRL REUS W/TWL LRG LVL3 (GOWN DISPOSABLE) ×3
GOWN STRL REUS W/TWL XL LVL3 (GOWN DISPOSABLE) ×3
KIT MARKER MARGIN INK (KITS) ×2 IMPLANT
NDL HYPO 25X1 1.5 SAFETY (NEEDLE) ×1 IMPLANT
NEEDLE HYPO 25X1 1.5 SAFETY (NEEDLE) ×3 IMPLANT
NS IRRIG 1000ML POUR BTL (IV SOLUTION) ×2 IMPLANT
PACK BASIN DAY SURGERY FS (CUSTOM PROCEDURE TRAY) ×3 IMPLANT
PENCIL BUTTON HOLSTER BLD 10FT (ELECTRODE) ×3 IMPLANT
SLEEVE SCD COMPRESS KNEE MED (MISCELLANEOUS) ×3 IMPLANT
SPONGE LAP 4X18 RFD (DISPOSABLE) ×3 IMPLANT
SUT MON AB 5-0 PS2 18 (SUTURE) ×3 IMPLANT
SUT VICRYL 3-0 CR8 SH (SUTURE) ×3 IMPLANT
SYR BULB 3OZ (MISCELLANEOUS) ×2 IMPLANT
SYR CONTROL 10ML LL (SYRINGE) ×3 IMPLANT
TOWEL GREEN STERILE FF (TOWEL DISPOSABLE) ×3 IMPLANT
TOWEL OR NON WOVEN STRL DISP B (DISPOSABLE) ×3 IMPLANT
TUBE CONNECTING 20'X1/4 (TUBING) ×1
TUBE CONNECTING 20X1/4 (TUBING) ×2 IMPLANT
YANKAUER SUCT BULB TIP NO VENT (SUCTIONS) ×3 IMPLANT

## 2018-08-20 NOTE — Op Note (Signed)
Preoperative Diagnosis: DUCTAL CARCINOMA IN SITU RIGHT BREAST  Postoprative Diagnosis: DUCTAL CARCINOMA IN SITU RIGHT BREAST  Procedure: Procedure(s): RE-EXCISION OF BREAST LUMPECTOMY   Surgeon: Excell Seltzer T   Assistants: None  Anesthesia:  General LMA anesthesia  Indications: Patient is a 61 year old female status post recent bilateral lumpectomy for invasive lobular carcinoma in the left breast and DCIS of the right breast.  Final margins showed a broadly close posterior margin of 1 mm for DCIS in the right lumpectomy.  I have recommended reexcision of the posterior margin.  We discussed the indications and nature of surgery and risks and she agrees to proceed.    Procedure Detail: Patient was brought to the operating room, placed in the supine position on the operating table, and laryngeal mask general anesthesia induced.  The right breast was widely sterilely prepped and draped.  She received preoperative IV antibiotics.  PAS were in place.  Patient timeout was performed and correct procedure verified.  The previous circumareolar incision was sharply reopened and dissection carried down through the subcutaneous tissue.  Previous suture material was removed and the previous lumpectomy cavity was widely opened.  The previous posterior margin was clearly defined and exposed with the marking clip identified.  I then excised an additional at least 5 mm to 1 cm thick entire posterior margin with cautery.  This was inked for margins and sent as a permanent specimen.  Soft tissue was infiltrated with Marcaine.  The wound was irrigated and complete hemostasis assured.  The new posterior margin was marked with a clip.  Deep breast and subcutaneous tissue was closed with interrupted 3-0 Vicryl and skin with running subcuticular 5-0 Monocryl and Dermabond.  Sponge needle and instrument counts were correct.    Findings: As above  Estimated Blood Loss:  Minimal         Drains: None  Blood  Given: none          Specimens: Reexcised posterior margin, new margin inked        Complications:  * No complications entered in OR log *         Disposition: PACU - hemodynamically stable.         Condition: stable

## 2018-08-20 NOTE — Transfer of Care (Signed)
Immediate Anesthesia Transfer of Care Note  Patient: Judith Mcneil  Procedure(s) Performed: RE-EXCISION OF BREAST LUMPECTOMY (Right Breast)  Patient Location: PACU  Anesthesia Type:General  Level of Consciousness: awake and patient cooperative  Airway & Oxygen Therapy: Patient Spontanous Breathing and Patient connected to face mask oxygen  Post-op Assessment: Report given to RN and Post -op Vital signs reviewed and stable  Post vital signs: Reviewed and stable  Last Vitals:  Vitals Value Taken Time  BP    Temp    Pulse 93 08/20/2018  1:20 PM  Resp 14 08/20/2018  1:20 PM  SpO2 99 % 08/20/2018  1:20 PM  Vitals shown include unvalidated device data.  Last Pain:  Vitals:   08/20/18 1031  TempSrc: Oral  PainSc: 0-No pain         Complications: No apparent anesthesia complications

## 2018-08-20 NOTE — Discharge Instructions (Signed)
[place] Office Phone Number [phone]  BREAST BIOPSY/ PARTIAL MASTECTOMY: POST OP INSTRUCTIONS  Always review your discharge instruction sheet given to you by the facility where your surgery was performed.  IF YOU HAVE DISABILITY OR FAMILY LEAVE FORMS, YOU MUST BRING THEM TO THE OFFICE FOR PROCESSING.  DO NOT GIVE THEM TO YOUR DOCTOR.  1. A prescription for pain medication may be given to you upon discharge.  Take your pain medication as prescribed, if needed.  If narcotic pain medicine is not needed, then you may take acetaminophen (Tylenol) or ibuprofen (Advil) as needed. 2. Take your usually prescribed medications unless otherwise directed 3. If you need a refill on your pain medication, please contact your pharmacy.  They will contact our office to request authorization.  Prescriptions will not be filled after 5pm or on week-ends. 4. You should eat very light the first 24 hours after surgery, such as soup, crackers, pudding, etc.  Resume your normal diet the day after surgery. 5. Most patients will experience some swelling and bruising in the breast.  Ice packs and a good support bra will help.  Swelling and bruising can take several days to resolve.  6. It is common to experience some constipation if taking pain medication after surgery.  Increasing fluid intake and taking a stool softener will usually help or prevent this problem from occurring.  A mild laxative (Milk of Magnesia or Miralax) should be taken according to package directions if there are no bowel movements after 48 hours. 7. Unless discharge instructions indicate otherwise, you may remove your bandages 24-48 hours after surgery, and you may shower at that time.  You may have steri-strips (small skin tapes) in place directly over the incision.  These strips should be left on the skin for 7-10 days.  If your surgeon used skin glue on the incision, you may shower in 24 hours.  The glue will flake off over the  next 2-3 weeks.  Any sutures or staples will be removed at the office during your follow-up visit. 8. ACTIVITIES:  You may resume regular daily activities (gradually increasing) beginning the next day.  Wearing a good support bra or sports bra minimizes pain and swelling.  You may have sexual intercourse when it is comfortable. a. You may drive when you no longer are taking prescription pain medication, you can comfortably wear a seatbelt, and you can safely maneuver your car and apply brakes. b. RETURN TO WORK:  ______________________________________________________________________________________ 9. You should see your doctor in the office for a follow-up appointment approximately two weeks after your surgery.  Your doctors nurse will typically make your follow-up appointment when she calls you with your pathology report.  Expect your pathology report 2-3 business days after your surgery.  You may call to check if you do not hear from [place] after three days. 10. OTHER INSTRUCTIONS: _______________________________________________________________________________________________ _____________________________________________________________________________________________________________________________________ _____________________________________________________________________________________________________________________________________ _____________________________________________________________________________________________________________________________________  WHEN TO CALL YOUR DOCTOR: 1. Fever over 101.0 2. Nausea and/or vomiting. 3. Extreme swelling or bruising. 4. Continued bleeding from incision. 5. Increased pain, redness, or drainage from the incision.  The clinic staff is available to answer your questions during regular business hours.  Please dont hesitate to call and ask to speak to one of the nurses for clinical concerns.  If you have a medical emergency, go to the nearest  emergency room or call 911.  A surgeon from [facility] is always on call at the hospital.   Post Anesthesia Home Care Instructions  Activity: Get plenty of rest for the remainder of the day. A responsible individual must stay with you for 24 hours following the procedure.  For the next 24 hours, DO NOT: -Drive a car -Paediatric nurse -Drink alcoholic beverages -Take any medication unless instructed by your physician -Make any legal decisions or sign important papers.  Meals: Start with liquid foods such as gelatin or soup. Progress to regular foods as tolerated. Avoid greasy, spicy, heavy foods. If nausea and/or vomiting occur, drink only clear liquids until the nausea and/or vomiting subsides. Call your physician if vomiting continues.  Special Instructions/Symptoms: Your throat may feel dry or sore from the anesthesia or the breathing tube placed in your throat during surgery. If this causes discomfort, gargle with warm salt water. The discomfort should disappear within 24 hours.  If you had a scopolamine patch placed behind your ear for the management of post- operative nausea and/or vomiting:  1. The medication in the patch is effective for 72 hours, after which it should be removed.  Wrap patch in a tissue and discard in the trash. Wash hands thoroughly with soap and water. 2. You may remove the patch earlier than 72 hours if you experience unpleasant side effects which may include dry mouth, dizziness or visual disturbances. 3. Avoid touching the patch. Wash your hands with soap and water after contact with the patch.    For further questions, please visit [email or web address]

## 2018-08-20 NOTE — Anesthesia Procedure Notes (Signed)
Procedure Name: LMA Insertion Date/Time: 08/20/2018 12:31 PM Performed by: Signe Colt, CRNA Pre-anesthesia Checklist: Patient identified, Emergency Drugs available, Suction available and Patient being monitored Patient Re-evaluated:Patient Re-evaluated prior to induction Oxygen Delivery Method: Circle system utilized Preoxygenation: Pre-oxygenation with 100% oxygen Induction Type: IV induction Ventilation: Mask ventilation without difficulty LMA: LMA inserted LMA Size: 4.0 Number of attempts: 1 Airway Equipment and Method: Bite block Placement Confirmation: positive ETCO2 Tube secured with: Tape Dental Injury: Teeth and Oropharynx as per pre-operative assessment

## 2018-08-20 NOTE — Anesthesia Postprocedure Evaluation (Signed)
Anesthesia Post Note  Patient: Judith Mcneil  Procedure(s) Performed: RE-EXCISION OF BREAST LUMPECTOMY (Right Breast)     Patient location during evaluation: PACU Anesthesia Type: General Level of consciousness: awake and alert, awake and oriented Pain management: pain level controlled Vital Signs Assessment: post-procedure vital signs reviewed and stable Respiratory status: spontaneous breathing, nonlabored ventilation and respiratory function stable Cardiovascular status: blood pressure returned to baseline and stable Postop Assessment: no apparent nausea or vomiting Anesthetic complications: no    Last Vitals:  Vitals:   08/20/18 1430 08/20/18 1446  BP: 130/67 123/77  Pulse: 93 99  Resp: 15 18  Temp:  36.5 C  SpO2: 97% 97%    Last Pain:  Vitals:   08/20/18 1446  TempSrc: Oral  PainSc: 2                  Catalina Gravel

## 2018-08-20 NOTE — Interval H&P Note (Signed)
History and Physical Interval Note:  08/20/2018 12:18 PM  Judith Mcneil  has presented today for surgery, with the diagnosis of DUCTAL CARCINOMA IN SITU RIGHT BREAST  The various methods of treatment have been discussed with the patient and family. After consideration of risks, benefits and other options for treatment, the patient has consented to  Procedure(s): RE-EXCISION OF BREAST LUMPECTOMY (Right) as a surgical intervention .  The patient's history has been reviewed, patient examined, no change in status, stable for surgery.  I have reviewed the patient's chart and labs.  Questions were answered to the patient's satisfaction.     Darene Lamer Smriti Barkow

## 2018-08-20 NOTE — Anesthesia Preprocedure Evaluation (Addendum)
Anesthesia Evaluation  Patient identified by MRN, date of birth, ID band Patient awake    Reviewed: Allergy & Precautions, NPO status , Patient's Chart, lab work & pertinent test results  History of Anesthesia Complications (+) PONV and history of anesthetic complications  Airway Mallampati: II  TM Distance: >3 FB Neck ROM: Full    Dental  (+) Teeth Intact, Dental Advisory Given Upper permanent bridge :   Pulmonary neg pulmonary ROS,    Pulmonary exam normal breath sounds clear to auscultation       Cardiovascular Exercise Tolerance: Good negative cardio ROS Normal cardiovascular exam Rhythm:Regular Rate:Normal     Neuro/Psych PSYCHIATRIC DISORDERS Anxiety negative neurological ROS     GI/Hepatic negative GI ROS, Neg liver ROS,   Endo/Other  Obesity   Renal/GU negative Renal ROS     Musculoskeletal negative musculoskeletal ROS (+)   Abdominal   Peds  Hematology negative hematology ROS (+)   Anesthesia Other Findings Day of surgery medications reviewed with the patient.    Reproductive/Obstetrics                            Anesthesia Physical Anesthesia Plan  ASA: II  Anesthesia Plan: General   Post-op Pain Management:    Induction: Intravenous  PONV Risk Score and Plan: 4 or greater and Scopolamine patch - Pre-op, Midazolam, Dexamethasone, Ondansetron, Diphenhydramine and TIVA  Airway Management Planned: LMA  Additional Equipment:   Intra-op Plan:   Post-operative Plan: Extubation in OR  Informed Consent: I have reviewed the patients History and Physical, chart, labs and discussed the procedure including the risks, benefits and alternatives for the proposed anesthesia with the patient or authorized representative who has indicated his/her understanding and acceptance.   Dental advisory given  Plan Discussed with: CRNA  Anesthesia Plan Comments:         Anesthesia Quick Evaluation

## 2018-08-21 ENCOUNTER — Encounter: Payer: Self-pay | Admitting: Physical Therapy

## 2018-08-21 ENCOUNTER — Encounter (HOSPITAL_BASED_OUTPATIENT_CLINIC_OR_DEPARTMENT_OTHER): Payer: Self-pay | Admitting: General Surgery

## 2018-08-21 ENCOUNTER — Encounter: Payer: Self-pay | Admitting: Radiation Oncology

## 2018-09-01 ENCOUNTER — Other Ambulatory Visit: Payer: Self-pay

## 2018-09-01 ENCOUNTER — Encounter: Payer: Self-pay | Admitting: Physical Therapy

## 2018-09-01 ENCOUNTER — Ambulatory Visit: Payer: BLUE CROSS/BLUE SHIELD | Attending: General Surgery | Admitting: Physical Therapy

## 2018-09-01 DIAGNOSIS — R293 Abnormal posture: Secondary | ICD-10-CM | POA: Diagnosis present

## 2018-09-01 DIAGNOSIS — Z483 Aftercare following surgery for neoplasm: Secondary | ICD-10-CM | POA: Diagnosis present

## 2018-09-01 DIAGNOSIS — Z170 Estrogen receptor positive status [ER+]: Secondary | ICD-10-CM | POA: Insufficient documentation

## 2018-09-01 DIAGNOSIS — R600 Localized edema: Secondary | ICD-10-CM | POA: Insufficient documentation

## 2018-09-01 DIAGNOSIS — C50412 Malignant neoplasm of upper-outer quadrant of left female breast: Secondary | ICD-10-CM | POA: Insufficient documentation

## 2018-09-01 NOTE — Therapy (Signed)
[place], [place], [address] Phone: [phone]   Fax:  [phone]  Physical Therapy Treatment  Patient Details  Name: Judith Mcneil MRN: [id number] Date of Birth: 12-05-1956 Referring Provider: Dr. Excell Seltzer   Encounter Date: 09/01/2018  PT End of Session - 09/01/18 1534    Visit Number  2    Number of Visits  10    Date for PT Re-Evaluation  09/29/18    PT Start Time  4401    PT Stop Time  1534    PT Time Calculation (min)  52 min    Activity Tolerance  Patient tolerated treatment well    Behavior During Therapy  [facility] for tasks assessed/performed       Past Medical History:  Diagnosis Date  . Anxiety   . Family history of breast cancer   . Family history of colon cancer in father   . Medical history non-contributory   . PONV (postoperative nausea and vomiting)     Past Surgical History:  Procedure Laterality Date  . BREAST LUMPECTOMY WITH RADIOACTIVE SEED AND SENTINEL LYMPH NODE BIOPSY Left 08/01/2018   Procedure: LEFT BREAST LUMPECTOMY WITH RADIOACTIVE SEED AND SENTINEL LYMPH NODE BIOPSY;  Surgeon: Excell Seltzer, MD;  Location: [place];  Service: General;  Laterality: Left;  . BREAST LUMPECTOMY WITH RADIOACTIVE SEED LOCALIZATION Right 08/01/2018   Procedure: RIGHT BREAST LUMPECTOMY X 2  WITH RADIOACTIVE SEED RIGHT LOCALIZATION X 2;  Surgeon: Excell Seltzer, MD;  Location: [place];  Service: General;  Laterality: Right;  . COLONOSCOPY     x 2  . DILATATION & CURETTAGE/HYSTEROSCOPY WITH TRUECLEAR N/A 08/14/2013   Procedure: DILATATION & CURETTAGE/HYSTEROSCOPY WITH TRUECLEAR;  Surgeon: Logan Bores, MD;  Location: [facility];  Service: Gynecology;  Laterality: N/A;  1 hr in the OR  . RE-EXCISION OF BREAST LUMPECTOMY Right 08/20/2018   Procedure: RE-EXCISION OF BREAST LUMPECTOMY;  Surgeon: Excell Seltzer, MD;  Location: [facility];  Service: General;  Laterality: Right;  . TONSILLECTOMY     age 62    There were no vitals filed for this visit.  Subjective Assessment - 09/01/18 1444    Subjective  Patient underwent a bilateral lumpectomy and left sentinel node biopsy on 08/01/18 with 0/1 nodes positive. She had a re-excision on the right breast on 08/20/18. Oncotype was 19 but will start radiation 09/09/18.    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 She had a re-excision on the right breast on 08/20/18. It is ER/PR positive and HER2 negative with a Ki67 of 3%.     Patient Stated Goals  See if my arms are doing ok    Currently in Pain?  Yes    Pain Score  2     Pain Location  Shoulder    Pain Orientation  Left    Pain Descriptors / Indicators  Aching    Pain Type  Surgical pain    Pain Onset  1 to 4 weeks ago    Pain Frequency  Intermittent    Aggravating Factors   Nothing    Pain Relieving Factors  Nothing         [facility] PT Assessment - 09/01/18 0001      Assessment   Medical Diagnosis  Left breast cancer    Referring Provider  Dr. Excell Seltzer    Onset Date/Surgical Date  08/01/18    Hand Dominance  Right    Prior Therapy  Baselines      Precautions   Precautions  Other (comment)    Precaution Comments  recent breast surgery      Restrictions   Weight Bearing Restrictions  No      Balance Screen   Has the patient fallen in the past 6 months  No    Has the patient had a decrease in activity level because of a fear of falling?   No    Is the patient reluctant to leave their home because of a fear of falling?   No      Home Environment   Living Environment  Private residence    Living Arrangements  Alone    Available Help at Discharge  Family      Prior Function   Level of Independence   Independent    Vocation  Full time employment    Programme researcher, broadcasting/film/video rep for contact lenses    Leisure  She does not exercise      Cognition   Overall Cognitive Status  Within Functional Limits for tasks assessed      Observation/Other Assessments   Observations  Left breast with increased edema which feels somewhat fibrotic; left breast is slightly lifted due to edema      Posture/Postural Control   Posture/Postural Control  Postural limitations    Postural Limitations  Rounded Shoulders;Forward head      ROM / Strength   AROM / PROM / Strength  AROM      AROM   AROM Assessment Site  Shoulder    Right/Left Shoulder  Right;Left    Right Shoulder Extension  59 Degrees    Right Shoulder Flexion  154 Degrees    Right Shoulder ABduction  170 Degrees    Right Shoulder Internal Rotation  73 Degrees    Right Shoulder External Rotation  70 Degrees    Left Shoulder Extension  60 Degrees    Left Shoulder Flexion  153 Degrees    Left Shoulder ABduction  164 Degrees    Left Shoulder Internal Rotation  72 Degrees    Left Shoulder External Rotation  85 Degrees        LYMPHEDEMA/ONCOLOGY QUESTIONNAIRE - 09/01/18 1451      Type   Cancer Type  Left breast cancer with right DCIS      Surgeries   Lumpectomy Date  08/01/18    Sentinel Lymph Node Biopsy Date  08/01/18    Other Surgery Date  08/20/18   Re-excision on right DCIS   Number Lymph Nodes Removed  1   On left side     Treatment   Active Chemotherapy Treatment  No    Past Chemotherapy Treatment  No    Active Radiation Treatment  No    Past Radiation Treatment  No    Current Hormone Treatment  No    Past Hormone Therapy  No      What other symptoms do you have   Are you Having Heaviness or Tightness  Yes   Left breast edema with c/o heaviness   Are you having Pain  Yes    Are you having pitting edema  No    Is it Hard or Difficult finding clothes that fit  No    Do you have infections  No    Is there  Decreased scar mobility  Yes    Stemmer Sign  No      Lymphedema Assessments   Lymphedema Assessments  Upper extremities      Right Upper Extremity Lymphedema   10 cm Proximal to Olecranon Process  30.7 cm    Olecranon Process  26.2 cm    10 cm Proximal to Ulnar Styloid Process  22.9 cm    Just Proximal to Ulnar Styloid Process  16.2 cm    Across Hand at [employer]  18.8 cm    At [place] of 2nd Digit  6.4 cm      Left Upper Extremity Lymphedema   10 cm Proximal to Olecranon Process  29.8 cm    Olecranon Process  25.8 cm    10 cm Proximal to Ulnar Styloid Process  21.8 cm    Just Proximal to Ulnar Styloid Process  15.9 cm    Across Hand at [employer]  18.8 cm    At [place] of 2nd Digit  6.4 cm        Quick Dash - 09/01/18 0001    Open a tight or new jar  No difficulty    Do heavy household chores (wash walls, wash floors)  Mild difficulty    Carry a shopping bag or briefcase  No difficulty    Wash your back  Mild difficulty    Use a knife to cut food  No difficulty    Recreational activities in which you take some force or impact through your arm, shoulder, or hand (golf, hammering, tennis)  Mild difficulty    During the past week, to what extent has your arm, shoulder or hand problem interfered with your normal social activities with family, friends, neighbors, or groups?  Slightly    During the past week, to what extent has your arm, shoulder or hand problem limited your work or other regular daily activities  Slightly    Arm, shoulder, or hand pain.  Mild    Tingling (pins and needles) in your arm, shoulder, or hand  Mild    Difficulty Sleeping  Mild difficulty    DASH Score  18.18 %                     PT Education - 09/01/18 1533    Education Details  Educated pt on breast seroma and edema and treatment to reduce swelling    Person(s) Educated  Patient    Methods  Explanation    Comprehension  Verbalized understanding          PT Long Term  Goals - 09/01/18 1537      PT LONG TERM GOAL #1   Title  Patient will demonstrate she has returned to bsaeline related to shoulder ROM and function.    Time  8    Period  Weeks    Status  Achieved      PT LONG TERM GOAL #2   Title  Patient will report >/= 50% improvement in left breast hardness and feeling of heaviness.    Time  4    Period  Weeks    Status  New      PT LONG TERM GOAL #3   Title  Improve DASH score to be </= 8 for improved overall shoulder function.    Baseline  18.18    Time  4    Period  Weeks    Status  New      PT LONG TERM GOAL #4   Title  Patient will verbalize good understanding of lymphedema / edema management including self manual lymph drainage.    Time  4    Period  Weeks    Status  New            Plan - 09/01/18 1534    Clinical Impression Statement  Patient is 1 month s/p bilateral lumpectomy and left sentinel node biopsy (0/1 nodes positive). Right breast had involved margins for DCIS so pt underwent a re-excision on 08/20/18 and is awaiting an appointment with her surgeon next week because she was told margins are still not clear. Her shoulder ROM has returned to baseline and there is no sign of left arm lymphedema. She does have a left edematous breast which she reports is causing discomfort. She will benefit from PT to reduce edema and learn lymphedema risk reduction practices. She begins radiation next week so reucing edema prior to that would be helpful.    History and Personal Factors relevant to plan of care:  Lives alone    Clinical Presentation  Stable    Rehab Potential  Excellent    PT Frequency  3x / week   May only see her 2x/week   PT Duration  4 weeks    PT Treatment/Interventions  ADLs/Self Care Home Management;Therapeutic exercise;Patient/family education;Manual techniques;Passive range of motion;Manual lymph drainage;DME Instruction    PT Next Visit Plan  Left breast manual lymph drainage    PT Home Exercise Plan  Post op  shoulder ROM HEP    Consulted and Agree with Plan of Care  Patient       Patient will benefit from skilled therapeutic intervention in order to improve the following deficits and impairments:  Decreased knowledge of precautions, Impaired UE functional use, Decreased range of motion, Postural dysfunction, Pain, Increased edema  Visit Diagnosis: Malignant neoplasm of upper-outer quadrant of left breast in female, estrogen receptor positive ([place]) - Plan: PT plan of care cert/re-cert  Abnormal posture - Plan: PT plan of care cert/re-cert  Aftercare following surgery for neoplasm - Plan: PT plan of care cert/re-cert  Localized edema - Plan: PT plan of care cert/re-cert     Problem List Patient Active Problem List   Diagnosis Date Noted  . Genetic testing 07/22/2018  . Family history of breast cancer   . Family history of colon cancer in father   . Malignant neoplasm of upper-outer quadrant of left breast in female, estrogen receptor positive ([place]) 06/24/2018   Annia Friendly, PT 09/01/18 3:41 PM   [place], [place], [address] Phone: [phone]   Fax:  [phone]  Name: Judith Mcneil MRN: [id number] Date of Birth: [birth year]

## 2018-09-02 ENCOUNTER — Ambulatory Visit: Payer: BLUE CROSS/BLUE SHIELD | Admitting: Physical Therapy

## 2018-09-02 DIAGNOSIS — R600 Localized edema: Secondary | ICD-10-CM

## 2018-09-02 DIAGNOSIS — Z483 Aftercare following surgery for neoplasm: Secondary | ICD-10-CM

## 2018-09-02 DIAGNOSIS — R293 Abnormal posture: Secondary | ICD-10-CM

## 2018-09-02 DIAGNOSIS — C50412 Malignant neoplasm of upper-outer quadrant of left female breast: Secondary | ICD-10-CM | POA: Diagnosis not present

## 2018-09-02 NOTE — Therapy (Signed)
[place], [place], [address] Phone: [phone]   Fax:  [phone]  Physical Therapy Treatment  Patient Details  Name: Judith Mcneil MRN: [id number] Date of Birth: [birth year] Referring Provider: Dr. Excell Seltzer   Encounter Date: 09/02/2018  PT End of Session - 09/02/18 1624    Visit Number  3    Number of Visits  10    Date for PT Re-Evaluation  09/29/18    PT Start Time  1520    PT Stop Time  1600    PT Time Calculation (min)  40 min    Activity Tolerance  Patient tolerated treatment well    Behavior During Therapy  [facility] for tasks assessed/performed       Past Medical History:  Diagnosis Date  . Anxiety   . Family history of breast cancer   . Family history of colon cancer in father   . Medical history non-contributory   . PONV (postoperative nausea and vomiting)     Past Surgical History:  Procedure Laterality Date  . BREAST LUMPECTOMY WITH RADIOACTIVE SEED AND SENTINEL LYMPH NODE BIOPSY Left 08/01/2018   Procedure: LEFT BREAST LUMPECTOMY WITH RADIOACTIVE SEED AND SENTINEL LYMPH NODE BIOPSY;  Surgeon: Excell Seltzer, MD;  Location: [facility];  Service: General;  Laterality: Left;  . BREAST LUMPECTOMY WITH RADIOACTIVE SEED LOCALIZATION Right 08/01/2018   Procedure: RIGHT BREAST LUMPECTOMY X 2  WITH RADIOACTIVE SEED RIGHT LOCALIZATION X 2;  Surgeon: Excell Seltzer, MD;  Location: [facility];  Service: General;  Laterality: Right;  . COLONOSCOPY     x 2  . DILATATION & CURETTAGE/HYSTEROSCOPY WITH TRUECLEAR N/A 08/14/2013   Procedure: DILATATION & CURETTAGE/HYSTEROSCOPY WITH TRUECLEAR;  Surgeon: Logan Bores, MD;  Location: [facility];  Service: Gynecology;  Laterality: N/A;  1 hr in the OR  . RE-EXCISION OF BREAST LUMPECTOMY Right 08/20/2018   Procedure: RE-EXCISION OF BREAST LUMPECTOMY;  Surgeon: Excell Seltzer, MD;  Location: [facility];  Service: General;  Laterality: Right;  . TONSILLECTOMY     age 62    There were no vitals filed for this visit.  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 She had a re-excision on the right breast on 08/20/18. It is ER/PR positive and HER2 negative with a Ki67 of 3%.     Patient Stated Goals  To get decreased swelling in her left breast    Currently in Pain?  Other (Comment)   heaviness of left breast    Pain Score  --   did not rate    Pain Location  Breast    Pain Orientation  Left    Pain Descriptors / Indicators  Heaviness                       [facility] Adult PT Treatment/Exercise - 09/02/18 0001      Self-Care   Self-Care  Other Self-Care Comments    Other Self-Care Comments   showed pt types of compression bras and swell spots and what to look for.in buying one.  Sent prescriptions to Dr Lindi Adie for compression bra/ swell  spots ( asked him to fax to [facility]) and for sleeve/gauntlet ( asked to fax to [facility] )  provided patch of small dotted foam to wear inside bra      Shoulder Exercises: Supine   Other Supine Exercises  dowel rod flexion and abduction x 5 reps       Manual Therapy   Manual Therapy  Manual Lymphatic Drainage (MLD)    Manual therapy comments  explained lymphatic system and gave pt handout for self manual lymph drainage of breast     Manual Lymphatic Drainage (MLD)  short neck , superficial and deep abdominals with diaphragmatic breathing, left inguinal nodes with left axillo- inguinal anastamosis, left abdomen and breast , left lateral trunk, instructing patient to do the same with hand over hand  instruction    did not got toward right axillary nodes or breast            PT Education - 09/02/18 1628    Education Details  self manual lymph drainage     Person(s) Educated  Patient    Methods  Explanation;Handout    Comprehension  Need further instruction          PT Long Term Goals - 09/01/18 1537      PT LONG TERM GOAL #1   Title  Patient will demonstrate she has returned to bsaeline related to shoulder ROM and function.    Time  8    Period  Weeks    Status  Achieved      PT LONG TERM GOAL #2   Title  Patient will report >/= 50% improvement in left breast hardness and feeling of heaviness.    Time  4    Period  Weeks    Status  New      PT LONG TERM GOAL #3   Title  Improve DASH score to be </= 8 for improved overall shoulder function.    Baseline  18.18    Time  4    Period  Weeks    Status  New      PT LONG TERM GOAL #4   Title  Patient will verbalize good understanding of lymphedema / edema management including self manual lymph drainage.    Time  4    Period  Weeks    Status  New            Plan - 09/02/18 1625    Clinical Impression Statement  Pt interested in getting compression bra as she feels she is having fullness in left superior breast  She was involved in learning about getting garments as well as learning about self manual lymph drainage     PT Duration  4 weeks    PT Treatment/Interventions  ADLs/Self Care Home Management;Therapeutic exercise;Patient/family education;Manual techniques;Passive range of motion;Manual lymph drainage;DME Instruction    PT Next Visit Plan  Left breast manual lymph drainage see if small dotted foam helped continue with self instruction     Consulted and Agree with Plan of Care  Patient       Patient will benefit from skilled therapeutic intervention in order to improve the following deficits and impairments:  Decreased knowledge of precautions, Impaired UE functional use, Decreased range of motion,  Postural dysfunction, Pain, Increased edema  Visit Diagnosis: Abnormal posture  Aftercare following surgery for neoplasm  Localized edema     Problem List Patient Active Problem List   Diagnosis Date Noted  . Genetic testing  07/22/2018  . Family history of breast cancer   . Family history of colon cancer in father   . Malignant neoplasm of upper-outer quadrant of left breast in female, estrogen receptor positive ([place]) 06/24/2018   Donato Heinz. Owens Shark PT  Norwood Levo 09/02/2018, 4:30 PM  [place], [place], [address] Phone: [phone]   Fax:  [phone]  Name: Judith Mcneil MRN: [id number] Date of Birth: June 01, 1956

## 2018-09-02 NOTE — Patient Instructions (Addendum)
First of all, check with your insurance company to see if provider is in [place] (for wigs and compression sleeves / gloves/gauntlets )  [place], [place] [address] [phone]  Will file some insurances --- call for appointment   [facility] (for mastectomy prosthetics and garments) [place], [place] [address] [phone] Will file some insurances --- call for appointment  [facility]  [address]  [place], [place] [address] [phone] Lower extremity garments  [facility] [place], [place]  [address] [place] ( Medicaid certified lymphedema fitter) [phone] [email or web address]  [employer] [address], [place] [address] [phone]  Other Resources: National Lymphedema Network:  www.lymphnet.org www.Klosetraining.com for patient articles and self manual lymph drainage information www.lymphedemablog.com has informative articles.  [employer].com www.lymphedemaproducts.com Www.brightlifedirect.com    Manual Lymph Drainage for Left Breast.  Do daily.  Do slowly. Use flat hands with just enough pressure to stretch the skin. Do not slide over the skin, but move the skin with the hand you're using. Lie down or sit comfortably (in a recliner, for example) to do this.  1) Hug yourself:  cross arms and do circles at collar bones near neck 5-7 times (to "wake up" lots of lymph nodes in this area). 2) Take slow deep breaths, allowing your belly to balloon out as your breathe in, 5x (to "wake up" abdominal lymph nodes to take on extra fluid). 3) Right armpit-stretch skin in small circles to stimulate intact lymph nodes there, 5-7x. 4) Left groin area, at panty line-stretch skin in small circles to stimulate lymph nodes 5-7x. 5) Redirect fluid from left chest toward right armpit (stretch skin starting  at left chest in 3-4 spots working toward right armpit) 3-4x across the chest. 6) Redirect fluid from left armpit toward left groin (cup your hand around the curve of your left side and do 3-4 "pumps" from armpit to groin) 3-4x down your side. 7) Draw an imaginary diagonal line from upper outer breast through the nipple area toward lower inner breast.  Direct fluid upward and inward from this line toward the pathway across your upper chest (established in #5).  Do this in three rows to treat all of the upper inner breast tissue, and do each row 3-4x. 8) Then repeat #5 above. 9) Direct fluid to treat all of lower outer breast tissue downward and outward toward pathway established in #6 that is aimed at the left groin. 10)  Then repeat #6 above. 11)  End with repeating #3 and #4 above.   [facility] [address] [place]   [id number] [phone]

## 2018-09-04 ENCOUNTER — Encounter: Payer: Self-pay | Admitting: Physical Therapy

## 2018-09-04 ENCOUNTER — Ambulatory Visit: Payer: BLUE CROSS/BLUE SHIELD | Admitting: Physical Therapy

## 2018-09-04 DIAGNOSIS — R600 Localized edema: Secondary | ICD-10-CM

## 2018-09-04 DIAGNOSIS — C50412 Malignant neoplasm of upper-outer quadrant of left female breast: Secondary | ICD-10-CM | POA: Diagnosis not present

## 2018-09-04 DIAGNOSIS — Z483 Aftercare following surgery for neoplasm: Secondary | ICD-10-CM

## 2018-09-04 DIAGNOSIS — R293 Abnormal posture: Secondary | ICD-10-CM

## 2018-09-04 NOTE — Therapy (Signed)
[place], [place], [address] Phone: [phone]   Fax:  [phone]  Physical Therapy Treatment  Patient Details  Name: Judith Mcneil MRN: [id number] Date of Birth: 26-Jul-1956 Referring Provider: Dr. Excell Seltzer   Encounter Date: 09/04/2018  PT End of Session - 09/04/18 1650    Visit Number  4    Number of Visits  10    Date for PT Re-Evaluation  09/29/18       Past Medical History:  Diagnosis Date  . Anxiety   . Family history of breast cancer   . Family history of colon cancer in father   . Medical history non-contributory   . PONV (postoperative nausea and vomiting)     Past Surgical History:  Procedure Laterality Date  . BREAST LUMPECTOMY WITH RADIOACTIVE SEED AND SENTINEL LYMPH NODE BIOPSY Left 08/01/2018   Procedure: LEFT BREAST LUMPECTOMY WITH RADIOACTIVE SEED AND SENTINEL LYMPH NODE BIOPSY;  Surgeon: Excell Seltzer, MD;  Location: [facility];  Service: General;  Laterality: Left;  . BREAST LUMPECTOMY WITH RADIOACTIVE SEED LOCALIZATION Right 08/01/2018   Procedure: RIGHT BREAST LUMPECTOMY X 2  WITH RADIOACTIVE SEED RIGHT LOCALIZATION X 2;  Surgeon: Excell Seltzer, MD;  Location: [facility];  Service: General;  Laterality: Right;  . COLONOSCOPY     x 2  . DILATATION & CURETTAGE/HYSTEROSCOPY WITH TRUECLEAR N/A 08/14/2013   Procedure: DILATATION & CURETTAGE/HYSTEROSCOPY WITH TRUECLEAR;  Surgeon: Logan Bores, MD;  Location: [facility];  Service: Gynecology;  Laterality: N/A;  1 hr in the OR  . RE-EXCISION OF BREAST LUMPECTOMY Right 08/20/2018   Procedure: RE-EXCISION OF BREAST LUMPECTOMY;  Surgeon: Excell Seltzer, MD;  Location: [facility];  Service: General;  Laterality: Right;  . TONSILLECTOMY     age 37    There were no vitals filed for this visit.  Subjective Assessment - 09/04/18 1646    Subjective  Pt  continues to have  swelling in her left breast.  She will go to get her compression bra on Tuesday.    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 She had a re-excision on the right breast on 08/20/18. It is ER/PR positive and HER2 negative with a Ki67 of 3%.                        [facility] Adult PT Treatment/Exercise - 09/04/18 0001      Manual Therapy   Manual Therapy  Manual Lymphatic Drainage (MLD)    Manual therapy comments  --    Manual Lymphatic Drainage (MLD)  diaphragmatic breathing short neck , superficial and deep abdominals with diaphragmatic breathing, left inguinal nodes with left axillo- inguinal anastamosis, left abdomen and breast , left lateral trunk, instructing patient to do the same with hand over hand instruction then to sidelying for more stationary circles on left lateral and superior breast posterior interaxillary anastamosis    did not got toward right axillary nodes or breast                 PT Long Term Goals - 09/01/18 1537      PT LONG TERM GOAL #1   Title  Patient will demonstrate she has returned to bsaeline related to shoulder ROM and function.  Time  8    Period  Weeks    Status  Achieved      PT LONG TERM GOAL #2   Title  Patient will report >/= 50% improvement in left breast hardness and feeling of heaviness.    Time  4    Period  Weeks    Status  New      PT LONG TERM GOAL #3   Title  Improve DASH score to be </= 8 for improved overall shoulder function.    Baseline  18.18    Time  4    Period  Weeks    Status  New      PT LONG TERM GOAL #4   Title  Patient will verbalize good understanding of lymphedema / edema management including self manual lymph drainage.    Time  4    Period  Weeks    Status  New            Plan - 09/04/18 1650     Clinical Impression Statement  Pt continues to have swelling in her left breast with fullness at the superior portion just above her incision. She will be able to do manual lymph drainage at home but still needs some instruction     Clinical Impairments Affecting Rehab Potential  None    PT Frequency  3x / week    PT Duration  4 weeks    PT Next Visit Plan  Left breast manual lymph drainage see if small dotted foam helped continue with self instruction     Consulted and Agree with Plan of Care  Patient       Patient will benefit from skilled therapeutic intervention in order to improve the following deficits and impairments:  Decreased knowledge of precautions, Impaired UE functional use, Decreased range of motion, Postural dysfunction, Pain, Increased edema  Visit Diagnosis: Abnormal posture  Aftercare following surgery for neoplasm  Localized edema     Problem List Patient Active Problem List   Diagnosis Date Noted  . Genetic testing 07/22/2018  . Family history of breast cancer   . Family history of colon cancer in father   . Malignant neoplasm of upper-outer quadrant of left breast in female, estrogen receptor positive ([place]) 06/24/2018   Donato Heinz. Owens Shark PT  Norwood Levo 09/04/2018, 4:54 PM  [place], [place], [address] Phone: [phone]   Fax:  [phone]  Name: Judith Mcneil MRN: [id number] Date of Birth: Nov 06, 1956

## 2018-09-08 ENCOUNTER — Ambulatory Visit: Payer: Self-pay | Admitting: General Surgery

## 2018-09-08 ENCOUNTER — Telehealth: Payer: Self-pay | Admitting: Radiation Oncology

## 2018-09-08 NOTE — Telephone Encounter (Signed)
Patient states she just spoke with her surgeon she will need additional surgery will call back when this is to be rescheduled

## 2018-09-09 ENCOUNTER — Ambulatory Visit
Admission: RE | Admit: 2018-09-09 | Discharge: 2018-09-09 | Disposition: A | Discharge status: Home / Self Care | Payer: BLUE CROSS/BLUE SHIELD | Source: Ambulatory Visit | Attending: Radiation Oncology | Admitting: Radiation Oncology

## 2018-09-09 ENCOUNTER — Ambulatory Visit: Payer: BLUE CROSS/BLUE SHIELD

## 2018-09-09 ENCOUNTER — Ambulatory Visit: Payer: BLUE CROSS/BLUE SHIELD | Admitting: Radiation Oncology

## 2018-09-11 ENCOUNTER — Ambulatory Visit: Payer: BLUE CROSS/BLUE SHIELD

## 2018-09-11 ENCOUNTER — Encounter: Payer: Self-pay | Admitting: Radiation Oncology

## 2018-09-11 ENCOUNTER — Other Ambulatory Visit: Payer: Self-pay

## 2018-09-11 ENCOUNTER — Encounter (HOSPITAL_BASED_OUTPATIENT_CLINIC_OR_DEPARTMENT_OTHER): Payer: Self-pay | Admitting: *Deleted

## 2018-09-11 ENCOUNTER — Encounter

## 2018-09-11 DIAGNOSIS — C50412 Malignant neoplasm of upper-outer quadrant of left female breast: Secondary | ICD-10-CM

## 2018-09-11 DIAGNOSIS — Z170 Estrogen receptor positive status [ER+]: Secondary | ICD-10-CM

## 2018-09-11 DIAGNOSIS — Z483 Aftercare following surgery for neoplasm: Secondary | ICD-10-CM

## 2018-09-11 DIAGNOSIS — R600 Localized edema: Secondary | ICD-10-CM

## 2018-09-11 DIAGNOSIS — R293 Abnormal posture: Secondary | ICD-10-CM

## 2018-09-11 NOTE — Therapy (Signed)
[place], [place], [address] Phone: [phone]   Fax:  [phone]  Physical Therapy Treatment  Patient Details  Name: Judith Mcneil MRN: [id number] Date of Birth: 03-18-1956 Referring Provider: Dr. Excell Seltzer   Encounter Date: 09/11/2018  PT End of Session - 09/11/18 0948    Visit Number  5    Number of Visits  10    Date for PT Re-Evaluation  09/29/18    PT Start Time  9417    PT Stop Time  0938    PT Time Calculation (min)  51 min    Activity Tolerance  Patient tolerated treatment well    Behavior During Therapy  [facility] for tasks assessed/performed       Past Medical History:  Diagnosis Date  . Anxiety   . Family history of breast cancer   . Family history of colon cancer in father   . Medical history non-contributory   . PONV (postoperative nausea and vomiting)     Past Surgical History:  Procedure Laterality Date  . BREAST LUMPECTOMY WITH RADIOACTIVE SEED AND SENTINEL LYMPH NODE BIOPSY Left 08/01/2018   Procedure: LEFT BREAST LUMPECTOMY WITH RADIOACTIVE SEED AND SENTINEL LYMPH NODE BIOPSY;  Surgeon: Excell Seltzer, MD;  Location: [facility];  Service: General;  Laterality: Left;  . BREAST LUMPECTOMY WITH RADIOACTIVE SEED LOCALIZATION Right 08/01/2018   Procedure: RIGHT BREAST LUMPECTOMY X 2  WITH RADIOACTIVE SEED RIGHT LOCALIZATION X 2;  Surgeon: Excell Seltzer, MD;  Location: [facility];  Service: General;  Laterality: Right;  . COLONOSCOPY     x 2  . DILATATION & CURETTAGE/HYSTEROSCOPY WITH TRUECLEAR N/A 08/14/2013   Procedure: DILATATION & CURETTAGE/HYSTEROSCOPY WITH TRUECLEAR;  Surgeon: Logan Bores, MD;  Location: [facility];  Service: Gynecology;  Laterality: N/A;  1 hr in the OR  . RE-EXCISION OF BREAST LUMPECTOMY Right 08/20/2018   Procedure: RE-EXCISION OF BREAST LUMPECTOMY;  Surgeon: Excell Seltzer, MD;  Location: [facility];  Service: General;  Laterality: Right;  . TONSILLECTOMY     age 12    There were no vitals filed for this visit.  Subjective Assessment - 09/11/18 0850    Subjective  I got a compression bra and it seems to fit well. The swelling seems to be improved. The foam was good but I haven't been wearing it since I got the bra. I have to have another excision to my Rt breast next week. I still don't know if I want a mastectomy, but not sure I'm going to have a choice bc I don't want to keep doing this.     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 She had a re-excision on the right breast on 08/20/18. It is ER/PR positive and HER2 negative with a Ki67 of 3%.     Patient Stated Goals  To get decreased swelling in her left breast    Currently in Pain?  No/denies                       [facility] Adult PT Treatment/Exercise - 09/11/18 0001      Manual Therapy   Manual therapy comments  Assessed pts new compression bra and this seems to be well fitting and pt reports it feeling comfortable  and swelling has been much improved since getting this and wearing it daily.     Manual Lymphatic Drainage (MLD)  short neck, 5 diaphragmatic breaths, left inguinal nodes with left axillo- inguinal anastomosis, left breast , left lateral trunk, then to Rt sidelying for continued focus to left lateral breast towards axillo-inguinal anastomosis                  PT Long Term Goals - 09/11/18 6599      PT LONG TERM GOAL #2   Title  Patient will report >/= 50% improvement in left breast hardness and feeling of heaviness.    Baseline  60% improvement-09/11/18    Status  Achieved      PT LONG TERM GOAL #3   Title  Improve DASH score to be </= 8 for improved overall shoulder function.    Status   On-going      PT LONG TERM GOAL #4   Title  Patient will verbalize good understanding of lymphedema / edema management including self manual lymph drainage.    Status  Achieved            Plan - 09/11/18 1005    Clinical Impression Statement  Pt is doing very well thus far. She is independent with her self manual lymph drainage and has gotten a compression bra since last visit and begun wearing this daily. She reports her swelling at this time much improved (<60%) and she would like to cancel her Monday visit as she is now having to have surgery for re-excision to her Rt breast Tuesday. So we R/S that visit to 2 weeks after surgery and then PT can assess how she is doing with Lt breast swelling and reassess Rt UE from after surgery. Also encouraged pt that once radiation starts she should continue wearing her compression bra daily and be adamant about her self MLD as well as radiation can worsen any lymphedema. She verbalized understanding.     Rehab Potential  Excellent    Clinical Impairments Affecting Rehab Potential  None    PT Frequency  3x / week    PT Duration  4 weeks    PT Treatment/Interventions  ADLs/Self Care Home Management;Therapeutic exercise;Patient/family education;Manual techniques;Passive range of motion;Manual lymph drainage;DME Instruction    PT Next Visit Plan  Assess Lt breast swelling and pts independence with Maintenance Phase of this. Assess Rt shoulder A/ROM since having re-excision of Rt breast. (pt was scheduled with PT for this purpose).    Consulted and Agree with Plan of Care  Patient       Patient will benefit from skilled therapeutic intervention in order to improve the following deficits and impairments:  Decreased knowledge of precautions, Impaired UE functional use, Decreased range of motion, Postural dysfunction, Pain, Increased edema  Visit Diagnosis: Abnormal posture  Aftercare following surgery for neoplasm  Localized edema  Malignant  neoplasm of upper-outer quadrant of left breast in female, estrogen receptor positive ([place])     Problem List Patient Active Problem List   Diagnosis Date Noted  . Genetic testing 07/22/2018  . Family history of breast cancer   . Family history of colon cancer in father   . Malignant neoplasm of upper-outer quadrant of left breast in female, estrogen receptor positive ([place]) 06/24/2018    Otelia Limes, PTA 09/11/2018, 10:12 AM  [place], [place], [address] Phone: [phone]   Fax:  [phone]  Name: Cynde  AKEILA LANA MRN: [id number] Date of Birth: 1956/07/16

## 2018-09-12 ENCOUNTER — Ambulatory Visit: Payer: Self-pay | Admitting: General Surgery

## 2018-09-12 NOTE — Pre-Procedure Instructions (Signed)
Ensure pre-surgery drink 10 oz. given to pt. with instructions to drink by 0700 DOS.  Pt. voiced understanding.

## 2018-09-15 ENCOUNTER — Ambulatory Visit: Payer: BLUE CROSS/BLUE SHIELD

## 2018-09-15 NOTE — H&P (Signed)
History of Present Illness Judith Kitchen T. Dorian Renfro MD; 09/12/2018 4:13 PM) The patient is a 62 year old female who presents with breast cancer. Patient returns for follow-up status post left breast lumpectomy and negative axillary sentinel lymph node biopsy and right breast lumpectomy for DCIS. Pathology from her initial surgery on August 9 showed a left invasive ductal carcinoma 1.2 cm, margin negative and negative axillary sentinel lymph node. However the right breast showed high-grade DCIS 1.4 cm less than 1 mm to the posterior margin broadly. We then performed a reexcision of the posterior margin on August 20, 2018 which unfortunately showed residual high-grade DCIS which extended to the new inked posterior margin. We have previously discussed this pathology. She returns for a recheck.  Her original presentation was as follows: She is a post menopausal female referred by Dr. Curlene Dolphin for evaluation of recently diagnosed carcinoma of the left breast. She recently presented for a screening mamogram revealing a possible mass and an area of distortion in the left breast. Subsequent imaging included diagnostic mamogram showing the mass resolved with spot compression views but there was a persistent area of architectural distortion in the upper central left breast middle third without discrete mass and ultrasound showing no correlate to the mammographic abnormality and negative axilla. A stereotactic guided breast biopsy was performed on 06/19/2018 with pathology revealing invasive ductal carcinoma of the breast and associated LCIS. She is seen now in breast multidisciplinary clinic for initial treatment planning. She has experienced no breast symptoms, specifically lump or pain or discharge. She has a history of benign needle biopsy of the right breast  Findings at that time were the following: Tumor size: distortion and difficult to determine, approximately 1.5 cm Tumor grade: 2, Ki-67  3% Estrogen Receptor: positive Progesterone Receptor: positive Her-2 neu: negative Lymph node status: negative  She reports no problems in the office today.    Problem List/Past Medical Judith Kitchen T. Lisha Vitale, MD; 09/12/2018 4:13 PM) MALIGNANT NEOPLASM OF LEFT BREAST, STAGE 1, ESTROGEN RECEPTOR POSITIVE (G38.756)   Past Surgical History Judith Kitchen T. Atharv Barriere, MD; 09/12/2018 4:13 PM) Breast Biopsy  Bilateral. Tonsillectomy   Diagnostic Studies History Judith Kitchen T. Jashayla Glatfelter, MD; 09/12/2018 4:13 PM) Colonoscopy  within last year Mammogram  within last year Pap Smear  1-5 years ago  Allergies (April Staton, [place]; 09/12/2018 4:01 PM) No Known Drug Allergies [06/24/2018]:  Medication History (April Staton, CMA; 09/12/2018 4:02 PM) Ondansetron (4MG Tablet Disint, 1 (one) Oral every six hours, as needed for nausea, Taken starting 08/11/2018) Active. Transderm-Scop (1.5 MG) (1MG/3DAYS Patch 72HR, 1 (one) Transdermal every 3 days, Taken starting 08/12/2018) Active. (Apply one patch behind the ear the day before surgery, remove in 3 days) BuPROPion HCl ER (SR) (150MG Tablet ER 12HR, Oral) Active. Calcium 500/Vitamin D (500-125MG-UNIT Tablet, Oral) Active. Climara (0.025MG/24HR Patch Weekly, Transdermal) Active. Claritin (10MG Tablet, Oral) Active. Ibuprofen (200MG Tablet, Oral) Active. Valtrex (500MG Tablet, Oral) Active. (1000 mg tab is listed inEpic) Percocet (5-325MG Tablet, Oral) Active. The Very Finest Fish Oil (Oral) Active. (Epic list reads "fish oil oil") Medications Reconciled  Social History Judith Kitchen T. Klaus Casteneda, MD; 09/12/2018 4:13 PM) Alcohol use  Occasional alcohol use. Caffeine use  Carbonated beverages. No drug use  Tobacco use  Never smoker.  Family History Judith Kitchen T. Sarye Kath, MD; 09/12/2018 4:13 PM) Alcohol Abuse  Father. Arthritis  Brother, Mother. Breast Cancer  Family Members In General. Colon Polyps  Father. Hypertension   Mother.  Pregnancy / Birth History Judith Kitchen T. Ahjanae Cassel, MD; 09/12/2018 4:13 PM)  Age at menarche  6 years. Age of menopause  >58 Contraceptive History  Intrauterine device, Oral contraceptives. Gravida  2 Para  2 Regular periods   Other Problems Judith Kitchen T. Shaya Altamura, MD; 09/12/2018 4:13 PM) Anxiety Disorder   Vitals (April Staton CMA; 09/12/2018 4:02 PM) 09/12/2018 4:02 PM Weight: 177.5 lb Height: 66in Body Surface Area: 1.9 m Body Mass Index: 28.65 kg/m  Pulse: 96 (Regular)  BP: 126/88 (Sitting, Left Arm, Standard)       Physical Exam Judith Kitchen T. Happy Begeman MD; 09/12/2018 4:14 PM) The physical exam findings are as follows: Note:General: Appears well Lungs: Clear to respirations Cardiac: Regular rate and rhythm Breasts: Bilateral healing rest incisions without palpating factors. Left axillary incision healing well    Assessment & Plan Judith Kitchen T. Dawnyel Leven MD; 09/12/2018 4:16 PM) MALIGNANT NEOPLASM OF LEFT BREAST, STAGE 1, ESTROGEN RECEPTOR POSITIVE (C50.912) Impression: Status post left breast lumpectomy and negative axillary sentinel lymph node biopsy for stage I, grade 1 invasive ductal carcinoma ER/PR positive, HER-2/neu -1.2 cm.  Status post right breast lumpectomy and subsequent reexcision for high-grade DCIS with persistent positive posterior margin. We previously discussed this at length. We discussed options of further reexcision versus total mastectomy. As we have just the positive posterior margin I think we should be able to clear this even if we have to take it down to the chest wall. She is still very interested in breast conservation and I think this is reasonable. We have therefore scheduled her for reexcision of the posterior margin right breast lumpectomy for DCIS. We discussed the nature of surgery and recovery and risks and all her questions were answered. Current Plans Schedule for Surgery  Reexcision right breast lumpectomy posterior  margin under general anesthesia as an outpatient

## 2018-09-16 ENCOUNTER — Other Ambulatory Visit: Payer: Self-pay

## 2018-09-16 ENCOUNTER — Ambulatory Visit (HOSPITAL_BASED_OUTPATIENT_CLINIC_OR_DEPARTMENT_OTHER): Payer: BLUE CROSS/BLUE SHIELD | Admitting: Certified Registered"

## 2018-09-16 ENCOUNTER — Ambulatory Visit (HOSPITAL_BASED_OUTPATIENT_CLINIC_OR_DEPARTMENT_OTHER)
Admission: RE | Admit: 2018-09-16 | Discharge: 2018-09-16 | Disposition: A | Discharge status: Home / Self Care | Payer: BLUE CROSS/BLUE SHIELD | Source: Ambulatory Visit | Attending: General Surgery | Admitting: General Surgery

## 2018-09-16 ENCOUNTER — Encounter (HOSPITAL_BASED_OUTPATIENT_CLINIC_OR_DEPARTMENT_OTHER): Payer: Self-pay | Admitting: Certified Registered"

## 2018-09-16 ENCOUNTER — Encounter (HOSPITAL_BASED_OUTPATIENT_CLINIC_OR_DEPARTMENT_OTHER)
Admission: RE | Disposition: A | Discharge status: Home / Self Care | Payer: Self-pay | Source: Ambulatory Visit | Attending: General Surgery

## 2018-09-16 DIAGNOSIS — D0511 Intraductal carcinoma in situ of right breast: Secondary | ICD-10-CM | POA: Diagnosis not present

## 2018-09-16 HISTORY — PX: RE-EXCISION OF BREAST LUMPECTOMY: SHX6048

## 2018-09-16 SURGERY — EXCISION, LESION, BREAST
Anesthesia: General | Site: Breast | Laterality: Right

## 2018-09-16 MED ORDER — CEFAZOLIN SODIUM-DEXTROSE 2-4 GM/100ML-% IV SOLN
INTRAVENOUS | Status: AC
  Filled 2018-09-16: qty 100

## 2018-09-16 MED ORDER — SCOPOLAMINE 1 MG/3DAYS TD PT72: 1.0000 | MEDICATED_PATCH | Freq: Once | TRANSDERMAL | Status: DC

## 2018-09-16 MED ORDER — ONDANSETRON HCL 4 MG/2ML IJ SOLN
INTRAMUSCULAR | Status: DC | PRN
  Administered 2018-09-16: 4 mg via INTRAVENOUS

## 2018-09-16 MED ORDER — LIDOCAINE HCL (CARDIAC) PF 100 MG/5ML IV SOSY
PREFILLED_SYRINGE | INTRAVENOUS | Status: DC | PRN
  Administered 2018-09-16: 60 mg via INTRAVENOUS

## 2018-09-16 MED ORDER — MIDAZOLAM HCL 2 MG/2ML IJ SOLN
1.0000 mg | INTRAMUSCULAR | Status: DC | PRN
  Administered 2018-09-16: 1 mg via INTRAVENOUS

## 2018-09-16 MED ORDER — SCOPOLAMINE 1 MG/3DAYS TD PT72: 1.0000 | MEDICATED_PATCH | Freq: Once | TRANSDERMAL | Status: DC | PRN

## 2018-09-16 MED ORDER — PROPOFOL 500 MG/50ML IV EMUL
INTRAVENOUS | Status: DC | PRN
  Administered 2018-09-16: 100 ug/kg/min via INTRAVENOUS

## 2018-09-16 MED ORDER — LACTATED RINGERS IV SOLN
INTRAVENOUS | Status: DC
  Administered 2018-09-16 (×2): via INTRAVENOUS

## 2018-09-16 MED ORDER — ONDANSETRON HCL 4 MG/2ML IJ SOLN
INTRAMUSCULAR | Status: AC
  Filled 2018-09-16: qty 8

## 2018-09-16 MED ORDER — CELECOXIB 200 MG PO CAPS
200.0000 mg | ORAL_CAPSULE | ORAL | Status: AC
  Administered 2018-09-16: 200 mg via ORAL

## 2018-09-16 MED ORDER — CHLORHEXIDINE GLUCONATE CLOTH 2 % EX PADS: 6.0000 | MEDICATED_PAD | Freq: Once | CUTANEOUS | Status: DC

## 2018-09-16 MED ORDER — GABAPENTIN 300 MG PO CAPS
300.0000 mg | ORAL_CAPSULE | ORAL | Status: AC
  Administered 2018-09-16: 300 mg via ORAL

## 2018-09-16 MED ORDER — FENTANYL CITRATE (PF) 100 MCG/2ML IJ SOLN: 25.0000 ug | INTRAMUSCULAR | Status: DC | PRN

## 2018-09-16 MED ORDER — DIPHENHYDRAMINE HCL 50 MG/ML IJ SOLN
INTRAMUSCULAR | Status: DC | PRN
  Administered 2018-09-16: 6.25 mg via INTRAVENOUS

## 2018-09-16 MED ORDER — CEFAZOLIN SODIUM-DEXTROSE 2-4 GM/100ML-% IV SOLN
2.0000 g | INTRAVENOUS | Status: AC
  Administered 2018-09-16: 2 g via INTRAVENOUS

## 2018-09-16 MED ORDER — PROPOFOL 500 MG/50ML IV EMUL
INTRAVENOUS | Status: AC
  Filled 2018-09-16: qty 150

## 2018-09-16 MED ORDER — BUPIVACAINE-EPINEPHRINE (PF) 0.25% -1:200000 IJ SOLN
INTRAMUSCULAR | Status: AC
  Filled 2018-09-16: qty 30

## 2018-09-16 MED ORDER — CELECOXIB 200 MG PO CAPS
ORAL_CAPSULE | ORAL | Status: AC
  Filled 2018-09-16: qty 1

## 2018-09-16 MED ORDER — PROPOFOL 10 MG/ML IV BOLUS
INTRAVENOUS | Status: DC | PRN
  Administered 2018-09-16: 150 mg via INTRAVENOUS

## 2018-09-16 MED ORDER — PROMETHAZINE HCL 25 MG/ML IJ SOLN: 6.2500 mg | INTRAMUSCULAR | Status: DC | PRN

## 2018-09-16 MED ORDER — FENTANYL CITRATE (PF) 100 MCG/2ML IJ SOLN
50.0000 ug | INTRAMUSCULAR | Status: DC | PRN
  Administered 2018-09-16 (×2): 50 ug via INTRAVENOUS

## 2018-09-16 MED ORDER — ACETAMINOPHEN 500 MG PO TABS
1000.0000 mg | ORAL_TABLET | ORAL | Status: AC
  Administered 2018-09-16: 1000 mg via ORAL

## 2018-09-16 MED ORDER — LIDOCAINE 2% (20 MG/ML) 5 ML SYRINGE
INTRAMUSCULAR | Status: AC
  Filled 2018-09-16: qty 20

## 2018-09-16 MED ORDER — DEXAMETHASONE SODIUM PHOSPHATE 4 MG/ML IJ SOLN
INTRAMUSCULAR | Status: DC | PRN
  Administered 2018-09-16: 10 mg via INTRAVENOUS

## 2018-09-16 MED ORDER — GABAPENTIN 300 MG PO CAPS
ORAL_CAPSULE | ORAL | Status: AC
  Filled 2018-09-16: qty 1

## 2018-09-16 MED ORDER — OXYCODONE HCL 5 MG PO TABS: 5.0000 mg | ORAL_TABLET | Freq: Four times a day (QID) | ORAL | 0 refills | Status: DC | PRN

## 2018-09-16 MED ORDER — MIDAZOLAM HCL 2 MG/2ML IJ SOLN
INTRAMUSCULAR | Status: AC
  Filled 2018-09-16: qty 2

## 2018-09-16 MED ORDER — DEXAMETHASONE SODIUM PHOSPHATE 10 MG/ML IJ SOLN
INTRAMUSCULAR | Status: AC
  Filled 2018-09-16: qty 4

## 2018-09-16 MED ORDER — ACETAMINOPHEN 500 MG PO TABS
ORAL_TABLET | ORAL | Status: AC
  Filled 2018-09-16: qty 2

## 2018-09-16 MED ORDER — FENTANYL CITRATE (PF) 100 MCG/2ML IJ SOLN
INTRAMUSCULAR | Status: AC
  Filled 2018-09-16: qty 2

## 2018-09-16 MED ORDER — BUPIVACAINE HCL (PF) 0.25 % IJ SOLN
INTRAMUSCULAR | Status: DC | PRN
  Administered 2018-09-16: 30 mL

## 2018-09-16 SURGICAL SUPPLY — 45 items
ADH SKN CLS APL DERMABOND .7 (GAUZE/BANDAGES/DRESSINGS) ×1
BINDER BREAST LRG (GAUZE/BANDAGES/DRESSINGS) ×2 IMPLANT
BLADE SURG 15 STRL LF DISP TIS (BLADE) ×1 IMPLANT
BLADE SURG 15 STRL SS (BLADE) ×3
CANISTER SUCT 1200ML W/VALVE (MISCELLANEOUS) ×3 IMPLANT
CHLORAPREP W/TINT 26ML (MISCELLANEOUS) ×3 IMPLANT
CLIP VESOCCLUDE SM WIDE 6/CT (CLIP) ×2 IMPLANT
COVER BACK TABLE 60X90IN (DRAPES) ×3 IMPLANT
COVER MAYO STAND STRL (DRAPES) ×3 IMPLANT
DERMABOND ADVANCED (GAUZE/BANDAGES/DRESSINGS) ×2
DERMABOND ADVANCED .7 DNX12 (GAUZE/BANDAGES/DRESSINGS) ×1 IMPLANT
DEVICE DUBIN W/COMP PLATE 8390 (MISCELLANEOUS) IMPLANT
DRAPE LAPAROSCOPIC ABDOMINAL (DRAPES) ×3 IMPLANT
DRAPE UTILITY XL STRL (DRAPES) ×3 IMPLANT
ELECT COATED BLADE 2.86 ST (ELECTRODE) ×3 IMPLANT
ELECT REM PT RETURN 9FT ADLT (ELECTROSURGICAL) ×3
ELECTRODE REM PT RTRN 9FT ADLT (ELECTROSURGICAL) ×1 IMPLANT
GLOVE BIO SURGEON STRL SZ 6.5 (GLOVE) ×1 IMPLANT
GLOVE BIO SURGEONS STRL SZ 6.5 (GLOVE) ×1
GLOVE BIOGEL PI IND STRL 7.0 (GLOVE) IMPLANT
GLOVE BIOGEL PI IND STRL 8 (GLOVE) ×1 IMPLANT
GLOVE BIOGEL PI INDICATOR 7.0 (GLOVE) ×2
GLOVE BIOGEL PI INDICATOR 8 (GLOVE) ×2
GLOVE ECLIPSE 7.5 STRL STRAW (GLOVE) ×5 IMPLANT
GOWN STRL REUS W/ TWL LRG LVL3 (GOWN DISPOSABLE) ×1 IMPLANT
GOWN STRL REUS W/ TWL XL LVL3 (GOWN DISPOSABLE) ×1 IMPLANT
GOWN STRL REUS W/TWL LRG LVL3 (GOWN DISPOSABLE) ×3
GOWN STRL REUS W/TWL XL LVL3 (GOWN DISPOSABLE) ×3
KIT MARKER MARGIN INK (KITS) ×2 IMPLANT
NDL HYPO 25X1 1.5 SAFETY (NEEDLE) ×1 IMPLANT
NEEDLE HYPO 25X1 1.5 SAFETY (NEEDLE) ×3 IMPLANT
NS IRRIG 1000ML POUR BTL (IV SOLUTION) IMPLANT
PACK BASIN DAY SURGERY FS (CUSTOM PROCEDURE TRAY) ×3 IMPLANT
PENCIL BUTTON HOLSTER BLD 10FT (ELECTRODE) ×3 IMPLANT
SLEEVE SCD COMPRESS KNEE MED (MISCELLANEOUS) ×3 IMPLANT
SPONGE LAP 4X18 RFD (DISPOSABLE) ×3 IMPLANT
SUT MON AB 5-0 PS2 18 (SUTURE) ×3 IMPLANT
SUT VICRYL 3-0 CR8 SH (SUTURE) ×3 IMPLANT
SYR BULB 3OZ (MISCELLANEOUS) IMPLANT
SYR CONTROL 10ML LL (SYRINGE) ×3 IMPLANT
TOWEL GREEN STERILE FF (TOWEL DISPOSABLE) ×3 IMPLANT
TOWEL OR NON WOVEN STRL DISP B (DISPOSABLE) ×1 IMPLANT
TUBE CONNECTING 20'X1/4 (TUBING) ×1
TUBE CONNECTING 20X1/4 (TUBING) ×2 IMPLANT
YANKAUER SUCT BULB TIP NO VENT (SUCTIONS) ×3 IMPLANT

## 2018-09-16 NOTE — Discharge Instructions (Signed)
[facility] °Office Phone Number [phone] ° °BREAST BIOPSY/ PARTIAL MASTECTOMY: POST OP INSTRUCTIONS ° °Always review your discharge instruction sheet given to you by the facility where your surgery was performed. ° °IF YOU HAVE DISABILITY OR FAMILY LEAVE FORMS, YOU MUST BRING THEM TO THE OFFICE FOR PROCESSING.  DO NOT GIVE THEM TO YOUR DOCTOR. ° °1. A prescription for pain medication may be given to you upon discharge.  Take your pain medication as prescribed, if needed.  If narcotic pain medicine is not needed, then you may take acetaminophen (Tylenol) or ibuprofen (Advil) as needed. °2. Take your usually prescribed medications unless otherwise directed °3. If you need a refill on your pain medication, please contact your pharmacy.  They will contact our office to request authorization.  Prescriptions will not be filled after 5pm or on week-ends. °4. You should eat very light the first 24 hours after surgery, such as soup, crackers, pudding, etc.  Resume your normal diet the day after surgery. °5. Most patients will experience some swelling and bruising in the breast.  Ice packs and a good support bra will help.  Swelling and bruising can take several days to resolve.  °6. It is common to experience some constipation if taking pain medication after surgery.  Increasing fluid intake and taking a stool softener will usually help or prevent this problem from occurring.  A mild laxative (Milk of Magnesia or Miralax) should be taken according to package directions if there are no bowel movements after 48 hours. °7. Unless discharge instructions indicate otherwise, you may remove your bandages 24-48 hours after surgery, and you may shower at that time.  You may have steri-strips (small skin tapes) in place directly over the incision.  These strips should be left on the skin for 7-10 days.  If your surgeon used skin glue on the incision, you may shower in 24 hours.  The glue will flake off over the  next 2-3 weeks.  Any sutures or staples will be removed at the office during your follow-up visit. °8. ACTIVITIES:  You may resume regular daily activities (gradually increasing) beginning the next day.  Wearing a good support bra or sports bra minimizes pain and swelling.  You may have sexual intercourse when it is comfortable. °a. You may drive when you no longer are taking prescription pain medication, you can comfortably wear a seatbelt, and you can safely maneuver your car and apply brakes. °b. RETURN TO WORK:  ______________________________________________________________________________________ °9. You should see your doctor in the office for a follow-up appointment approximately two weeks after your surgery.  Your doctor’s nurse will typically make your follow-up appointment when she calls you with your pathology report.  Expect your pathology report 2-3 business days after your surgery.  You may call to check if you do not hear from us after three days. °10. OTHER INSTRUCTIONS: _______________________________________________________________________________________________ _____________________________________________________________________________________________________________________________________ °_____________________________________________________________________________________________________________________________________ °_____________________________________________________________________________________________________________________________________ ° °WHEN TO CALL YOUR DOCTOR: °1. Fever over 101.0 °2. Nausea and/or vomiting. °3. Extreme swelling or bruising. °4. Continued bleeding from incision. °5. Increased pain, redness, or drainage from the incision. ° °The clinic staff is available to answer your questions during regular business hours.  Please don’t hesitate to call and ask to speak to one of the nurses for clinical concerns.  If you have a medical emergency, go to the nearest  emergency room or call 911.  A surgeon from [facility] is always on call at the hospital. ° °For further questions, please visit [email or web address]  ° ° ° ° °  Post Anesthesia Home Care Instructions ° °Activity: °Get plenty of rest for the remainder of the day. A responsible individual must stay with you for 24 hours following the procedure.  °For the next 24 hours, DO NOT: °-Drive a car °-Operate machinery °-Drink alcoholic beverages °-Take any medication unless instructed by your physician °-Make any legal decisions or sign important papers. ° °Meals: °Start with liquid foods such as gelatin or soup. Progress to regular foods as tolerated. Avoid greasy, spicy, heavy foods. If nausea and/or vomiting occur, drink only clear liquids until the nausea and/or vomiting subsides. Call your physician if vomiting continues. ° °Special Instructions/Symptoms: °Your throat may feel dry or sore from the anesthesia or the breathing tube placed in your throat during surgery. If this causes discomfort, gargle with warm salt water. The discomfort should disappear within 24 hours. ° °If you had a scopolamine patch placed behind your ear for the management of post- operative nausea and/or vomiting: ° °1. The medication in the patch is effective for 72 hours, after which it should be removed.  Wrap patch in a tissue and discard in the trash. Wash hands thoroughly with soap and water. °2. You may remove the patch earlier than 72 hours if you experience unpleasant side effects which may include dry mouth, dizziness or visual disturbances. °3. Avoid touching the patch. Wash your hands with soap and water after contact with the patch. °  ° °

## 2018-09-16 NOTE — Anesthesia Procedure Notes (Signed)
Procedure Name: LMA Insertion Date/Time: 09/16/2018 10:32 AM Performed by: Signe Colt, CRNA Pre-anesthesia Checklist: Patient identified, Emergency Drugs available, Suction available and Patient being monitored Patient Re-evaluated:Patient Re-evaluated prior to induction Oxygen Delivery Method: Circle system utilized Preoxygenation: Pre-oxygenation with 100% oxygen Induction Type: IV induction Ventilation: Mask ventilation without difficulty LMA: LMA inserted LMA Size: 4.0 Number of attempts: 1 Airway Equipment and Method: Bite block Placement Confirmation: positive ETCO2 Tube secured with: Tape Dental Injury: Teeth and Oropharynx as per pre-operative assessment

## 2018-09-16 NOTE — Interval H&P Note (Signed)
History and Physical Interval Note:  09/16/2018 10:15 AM  Judith Mcneil  has presented today for surgery, with the diagnosis of DUCTAL CARCINOMA IN SITU RIGHT BREAST  The various methods of treatment have been discussed with the patient and family. After consideration of risks, benefits and other options for treatment, the patient has consented to  Procedure(s): RE-EXCISION OF BREAST LUMPECTOMY (Right) as a surgical intervention .  The patient's history has been reviewed, patient examined, no change in status, stable for surgery.  I have reviewed the patient's chart and labs.  Questions were answered to the patient's satisfaction.     Darene Lamer Osaze Hubbert

## 2018-09-16 NOTE — Transfer of Care (Signed)
Immediate Anesthesia Transfer of Care Note  Patient: Judith Mcneil  Procedure(s) Performed: RE-EXCISION OF BREAST LUMPECTOMY (Right Breast)  Patient Location: PACU  Anesthesia Type:General  Level of Consciousness: awake and patient cooperative  Airway & Oxygen Therapy: Patient Spontanous Breathing and Patient connected to face mask oxygen  Post-op Assessment: Report given to RN and Post -op Vital signs reviewed and stable  Post vital signs: Reviewed and stable  Last Vitals:  Vitals Value Taken Time  BP    Temp    Pulse 79 09/16/2018 11:25 AM  Resp    SpO2 98 % 09/16/2018 11:25 AM  Vitals shown include unvalidated device data.  Last Pain:  Vitals:   09/16/18 0927  TempSrc: Oral  PainSc: 0-No pain         Complications: No apparent anesthesia complications

## 2018-09-16 NOTE — Anesthesia Preprocedure Evaluation (Signed)
Anesthesia Evaluation  Patient identified by MRN, date of birth, ID band Patient awake    Reviewed: Allergy & Precautions, NPO status , Patient's Chart, lab work & pertinent test results  History of Anesthesia Complications (+) PONV and history of anesthetic complications  Airway Mallampati: II  TM Distance: >3 FB Neck ROM: Full    Dental  (+) Teeth Intact, Dental Advisory Given Upper permanent bridge :   Pulmonary neg pulmonary ROS,    Pulmonary exam normal breath sounds clear to auscultation       Cardiovascular Exercise Tolerance: Good negative cardio ROS Normal cardiovascular exam Rhythm:Regular Rate:Normal     Neuro/Psych PSYCHIATRIC DISORDERS Anxiety negative neurological ROS     GI/Hepatic negative GI ROS, Neg liver ROS,   Endo/Other  Obesity   Renal/GU negative Renal ROS     Musculoskeletal negative musculoskeletal ROS (+)   Abdominal   Peds  Hematology negative hematology ROS (+)   Anesthesia Other Findings Day of surgery medications reviewed with the patient.  Right breast cancer  Reproductive/Obstetrics                             Anesthesia Physical  Anesthesia Plan  ASA: II  Anesthesia Plan: General   Post-op Pain Management:    Induction: Intravenous  PONV Risk Score and Plan: 4 or greater and Scopolamine patch - Pre-op, Midazolam, Dexamethasone, Ondansetron and Diphenhydramine  Airway Management Planned: LMA  Additional Equipment:   Intra-op Plan:   Post-operative Plan: Extubation in OR  Informed Consent: I have reviewed the patients History and Physical, chart, labs and discussed the procedure including the risks, benefits and alternatives for the proposed anesthesia with the patient or authorized representative who has indicated his/her understanding and acceptance.   Dental advisory given  Plan Discussed with: CRNA  Anesthesia Plan Comments:          Anesthesia Quick Evaluation

## 2018-09-16 NOTE — Anesthesia Postprocedure Evaluation (Signed)
Anesthesia Post Note  Patient: [name]  Procedure(s) Performed: RE-EXCISION OF BREAST LUMPECTOMY (Right Breast)     Patient location during evaluation: PACU Anesthesia Type: General Level of consciousness: awake and alert, awake and oriented Pain management: pain level controlled Vital Signs Assessment: post-procedure vital signs reviewed and stable Respiratory status: spontaneous breathing, nonlabored ventilation and respiratory function stable Cardiovascular status: blood pressure returned to baseline and stable Postop Assessment: no apparent nausea or vomiting Anesthetic complications: no    Last Vitals:  Vitals:   09/16/18 1200 09/16/18 1230  BP: (!) 140/96 (!) 147/86  Pulse: 67 83  Resp: 17 18  Temp:  36.5 C  SpO2: 100% 100%    Last Pain:  Vitals:   09/16/18 1230  TempSrc:   PainSc: 4                  Catalina Gravel

## 2018-09-16 NOTE — Op Note (Signed)
Preoperative Diagnosis: DUCTAL CARCINOMA IN SITU RIGHT BREAST  Postoprative Diagnosis: DUCTAL CARCINOMA IN SITU RIGHT BREAST  Procedure: Procedure(s): RE-EXCISION OF BREAST LUMPECTOMY   Surgeon: Excell Seltzer T   Assistants: None  Anesthesia:  General LMA anesthesia  Indications: Patient is status post recent right breast lumpectomy for DCIS.  Initial posterior margin was broadly within 1 mm.  She then underwent reexcision of the posterior margin unfortunately showing involvement of the inked posterior margin with DCIS.  After discussion of options and risks detailed elsewhere we have elected to proceed with another reexcision of the posterior margin.     Procedure Detail: Patient was brought to the operating room, placed in the supine position on the operating table, and laryngeal mask general anesthesia induced.  She received preoperative IV antibiotics.  PAS were in place.  The right breast was widely sterilely prepped and draped.  Patient timeout was performed and correct procedure verified.  I used the previous upper outer circumareolar incision which was sharply opened and dissection was carried down through subtendinous and breast tissue with cautery and the previous lumpectomy site open.  A small seroma was drained.  The lumpectomy cavity was widely opened and the posterior wall completely exposed.  I then widely excised the entire posterior margin down to essentially the chest wall with muscle exposed in a couple of areas.  This was about an additional 1 cm margin.  This margin was inked and the specimen sent for permanent pathology.  I then further excised some thin breast tissue and fascia over muscle over the entire posterior margin which was sent as a separate specimen.  Hemostasis was obtained with cautery.  Soft tissue was infiltrated with Marcaine.  The new posterior margin was marked with a clip.  The deep breast and subtenons tissue was closed with interrupted 3-0 Vicryl and  skin closed with subcuticular Monocryl and Dermabond.    Findings: Above  Estimated Blood Loss:  Minimal         Drains: None  Blood Given: none          Specimens: #1 reexcised posterior margin right breast new margin inked #2 further posterior tissue/chest wall        Complications:  * No complications entered in OR log *         Disposition: PACU - hemodynamically stable.         Condition: stable

## 2018-09-17 ENCOUNTER — Encounter (HOSPITAL_BASED_OUTPATIENT_CLINIC_OR_DEPARTMENT_OTHER): Payer: Self-pay | Admitting: General Surgery

## 2018-09-29 ENCOUNTER — Ambulatory Visit: Payer: BLUE CROSS/BLUE SHIELD | Attending: General Surgery | Admitting: Physical Therapy

## 2018-09-29 ENCOUNTER — Encounter: Payer: Self-pay | Admitting: Physical Therapy

## 2018-09-29 DIAGNOSIS — Z483 Aftercare following surgery for neoplasm: Secondary | ICD-10-CM | POA: Diagnosis present

## 2018-09-29 DIAGNOSIS — R600 Localized edema: Secondary | ICD-10-CM

## 2018-09-29 DIAGNOSIS — Z170 Estrogen receptor positive status [ER+]: Secondary | ICD-10-CM | POA: Diagnosis present

## 2018-09-29 DIAGNOSIS — C50412 Malignant neoplasm of upper-outer quadrant of left female breast: Secondary | ICD-10-CM

## 2018-09-29 DIAGNOSIS — R293 Abnormal posture: Secondary | ICD-10-CM

## 2018-09-29 NOTE — Therapy (Signed)
[place], [place], [address] Phone: [phone]   Fax:  [phone]  Physical Therapy Treatment  Patient Details  Name: Judith Mcneil MRN: [id number] Date of Birth: 05-30-1956 Referring Provider (PT): Dr. Excell Seltzer   Encounter Date: 09/29/2018  PT End of Session - 09/29/18 1109    Visit Number  6    Number of Visits  14    Date for PT Re-Evaluation  10/27/18    PT Start Time  1017    PT Stop Time  1104    PT Time Calculation (min)  47 min    Activity Tolerance  Patient tolerated treatment well    Behavior During Therapy  [facility] for tasks assessed/performed       Past Medical History:  Diagnosis Date  . Anxiety   . Family history of breast cancer   . Family history of colon cancer in father   . Medical history non-contributory   . PONV (postoperative nausea and vomiting)     Past Surgical History:  Procedure Laterality Date  . BREAST LUMPECTOMY WITH RADIOACTIVE SEED AND SENTINEL LYMPH NODE BIOPSY Left 08/01/2018   Procedure: LEFT BREAST LUMPECTOMY WITH RADIOACTIVE SEED AND SENTINEL LYMPH NODE BIOPSY;  Surgeon: Excell Seltzer, MD;  Location: [facility];  Service: General;  Laterality: Left;  . BREAST LUMPECTOMY WITH RADIOACTIVE SEED LOCALIZATION Right 08/01/2018   Procedure: RIGHT BREAST LUMPECTOMY X 2  WITH RADIOACTIVE SEED RIGHT LOCALIZATION X 2;  Surgeon: Excell Seltzer, MD;  Location: [facility];  Service: General;  Laterality: Right;  . COLONOSCOPY     x 2  . DILATATION & CURETTAGE/HYSTEROSCOPY WITH TRUECLEAR N/A 08/14/2013   Procedure: DILATATION & CURETTAGE/HYSTEROSCOPY WITH TRUECLEAR;  Surgeon: Logan Bores, MD;  Location: [facility];  Service: Gynecology;  Laterality: N/A;  1 hr in the OR  . RE-EXCISION OF BREAST LUMPECTOMY Right 08/20/2018   Procedure: RE-EXCISION OF BREAST LUMPECTOMY;  Surgeon: Excell Seltzer, MD;  Location: [facility];  Service: General;  Laterality: Right;  . RE-EXCISION OF BREAST LUMPECTOMY Right 09/16/2018   Procedure: RE-EXCISION OF BREAST LUMPECTOMY;  Surgeon: Excell Seltzer, MD;  Location: [facility];  Service: General;  Laterality: Right;  . TONSILLECTOMY     age 2    There were no vitals filed for this visit.  Subjective Assessment - 09/29/18 1020    Subjective  I had a right excision and they finally got clean margins. I do not feel like I am having any swelling on the right and I feel like it has healed pretty well. I am not having any tightness in the RUE. I think the swelling on the left is better. There is not a big knot in it like there used to be.     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 She had a re-excision on the right breast on 08/20/18. It is ER/PR positive and HER2 negative with a Ki67 of 3%.     Patient Stated Goals  To get decreased swelling in her left breast    Currently in Pain?  No/denies    Pain Score  0-No pain               Quick Dash - 09/29/18 0001    Open a tight  or new jar  Mild difficulty    Do heavy household chores (wash walls, wash floors)  Mild difficulty    Carry a shopping bag or briefcase  No difficulty    Wash your back  No difficulty    Use a knife to cut food  No difficulty    Recreational activities in which you take some force or impact through your arm, shoulder, or hand (golf, hammering, tennis)  Mild difficulty    During the past week, to what extent has your arm, shoulder or hand problem interfered with your normal social activities with family, friends, neighbors, or groups?  Not at all    During the past week, to what extent has your arm, shoulder or hand problem limited your work or other regular daily activities   Not at all    Arm, shoulder, or hand pain.  None    Tingling (pins and needles) in your arm, shoulder, or hand  None    Difficulty Sleeping  No difficulty    DASH Score  6.82 %             [facility] Adult PT Treatment/Exercise - 09/29/18 0001      Manual Therapy   Manual Therapy  Manual Lymphatic Drainage (MLD);Myofascial release    Myofascial Release  to area of fibrosis under left lumpectomy scar in area of hardness    Manual Lymphatic Drainage (MLD)  short neck, 5 diaphragmatic breaths, left inguinal nodes with left axillo- inguinal anastomosis, left breast , left lateral trunk, then repeated pathways                  PT Long Term Goals - 09/29/18 1022      PT LONG TERM GOAL #1   Title  Patient will demonstrate she has returned to bsaeline related to shoulder ROM and function.    Time  8    Period  Weeks    Status  Achieved      PT LONG TERM GOAL #2   Title  Patient will report >/= 50% improvement in left breast hardness and feeling of heaviness.    Baseline  60% improvement-09/11/18    Time  4    Period  Weeks    Status  Achieved      PT LONG TERM GOAL #3   Title  Improve DASH score to be </= 8 for improved overall shoulder function.    Baseline  18.18, 09/29/18- 6.82    Time  4    Period  Weeks    Status  Achieved      PT LONG TERM GOAL #4   Title  Patient will verbalize good understanding of lymphedema / edema management including self manual lymph drainage.    Time  4    Period  Weeks    Status  Achieved      PT LONG TERM GOAL #5   Title  Pt will report a 75% improvement in hardness of scar tissue under left lumpectomy scar     Time  4    Period  Weeks    Status  New    Target Date  10/27/18            Plan - 09/29/18 1109    Clinical Impression Statement  Pt just had her re excision on her right breast. At this time she is not having any swelling in her right breast and she has full ROM of her RUE. She is still having  some fibrosis and  scar tissue in her left breast especially under left lumpectomy scar. She will be starting radiation which could increase the fibrosis and swelling in her left breast. Pt would benefit from skilled PT services 1x/wk for another 4 weeks to decrease fibrosis and monitor swelling in left breast.     Rehab Potential  Good    Clinical Impairments Affecting Rehab Potential  pt beginning radiation    PT Frequency  1x / week    PT Duration  4 weeks    PT Treatment/Interventions  ADLs/Self Care Home Management;Therapeutic exercise;Patient/family education;Manual techniques;Passive range of motion;Manual lymph drainage;DME Instruction    PT Next Visit Plan  continue MLD to left breast and scar mobilization to left lumpectomy scar, assess for increase in swelling while pt goes through radiation    PT Home Exercise Plan  Post op shoulder ROM HEP    Consulted and Agree with Plan of Care  Patient       Patient will benefit from skilled therapeutic intervention in order to improve the following deficits and impairments:  Decreased knowledge of precautions, Impaired UE functional use, Decreased range of motion, Postural dysfunction, Pain, Increased edema  Visit Diagnosis: Localized edema - Plan: PT plan of care cert/re-cert  Aftercare following surgery for neoplasm - Plan: PT plan of care cert/re-cert  Abnormal posture - Plan: PT plan of care cert/re-cert  Malignant neoplasm of upper-outer quadrant of left breast in female, estrogen receptor positive ([place]) - Plan: PT plan of care cert/re-cert     Problem List Patient Active Problem List   Diagnosis Date Noted  . Genetic testing 07/22/2018  . Family history of breast cancer   . Family history of colon cancer in father   . Malignant neoplasm of upper-outer quadrant of left breast in female, estrogen receptor positive ([place]) 06/24/2018    Allyson Sabal [facility] 09/29/2018, 11:15 AM  [place], [place], [address] Phone: [phone]   Fax:  [phone]  Name: ALEEYAH BENSEN MRN: [id number] Date of Birth: 03/08/1956  Manus Gunning, PT 09/29/18 11:15 AM

## 2018-10-01 ENCOUNTER — Encounter: Payer: Self-pay | Admitting: Adult Health

## 2018-10-01 DIAGNOSIS — D0511 Intraductal carcinoma in situ of right breast: Secondary | ICD-10-CM | POA: Insufficient documentation

## 2018-10-01 NOTE — Progress Notes (Addendum)
Location of Breast Cancer:Upper-outer quadrant of left breast in female,Ductal Carcinoma in SITU right breast  Histology per Pathology Report: AGNOSIS Diagnosis 08-07-18 1. Breast, right, needle core biopsy, superior - DUCTAL CARCINOMA IN SITU WITH CALCIFICATIONS. - SEE COMMENT. 2. Breast, right, needle core biopsy, UOQ - ATYPICAL DUCTAL HYPERPLASIA WITH CALCIFICATIONS. - SEE COMMENT. 1 of 2 FINAL for Judith Mcneil, Judith Mcneil ([id number]) Microscopic Comment 1. The carcinoma appears intermediate to high grade. Estrogen receptor and progesterone receptor studies will be performed and the results reported separately. The results are called to [facility] on 07/08/18. (JBK:gt, 07/08/18) 2. The atypical cells are negative for cytokeratin 5/6, supporting the above diagnosis. (JBK:ecj 07/08/2018)   Receptor Status: ER(100 % +), PR (100 % +), Her2-neu (- ratio 1.09), Ki-(3 %)  FINAL DIAGNOSIS Diagnosis 06-19-18 Breast, left, needle core biopsy, upper central - INVASIVE MAMMARY CARCINOMA. - MAMMARY CARCINOMA IN SITU. - SEE COMMENT. Microscopic Comment The carcinoma appears grade II. An E-Cadherin and a breast prognostic profile will be performed and the results reported separately. The results were called to the [facility] on 06/20/18. (JBK:kh 06/20/18) Enid Cutter MD Pathologist, Electronic Signature (Case signed 06/20/2018) Specimen Gross and Clinical Information  Receptor Status: ER(95 % +), PR (90 % +), Her2-neu (- ratio 1.09), Ki-(3 %)  Did patient present with symptoms (if so, please note symptoms) or was this found on screening mammography?: The patient was noted to have a screening distortion and mass seen in the left breast on screening mammogram.   Past/Anticipated interventions by surgeon, if any:Dr. Marshall DIAGNOSIS Diagnosis 09-16-18 Dr. Marland Kitchen [place] 1. Breast, excision, Posterior margin, right - DUCTAL  CARCINOMA IN SITU, LOW GRADE. - DUCTAL CARCINOMA IN SITU IS FOCALLY LESS THAN 0.1 CM TO THE POSTERIOR MARGIN OF SPECIMEN 1. 2. Breast, excision, additional posterior, right chest wall - BENIGN FIBROADIPOSE TISSUE. - BENIGN SKELETAL MUSCLE. - THERE IS NO EVIDENCE OF MALIGNANCY. - SEE COMMENT. Microscopic Comment 2. The surgical resection margin(s) of the specimen were inked and microscopically evaluated. Enid Cutter MD Pathologist, Electronic Signature (Case signed 09/17/2018) Specimen Gross and Clinical Informati INAL DIAGNOSIS Diagnosis 08-20-18 Dr. Excell Seltzer  Breast, excision, Right additional Posterior Margin - RESIDUAL HIGH GRADE DUCTAL CARCINOMA IN SITU - CARCINOMA EXTENDS TO THE INKED POSTERIOR MARGIN - FLAT EPITHELIAL ATYPIA, USUAL DUCTAL HYPERPLASIA, AND PAPILLOMAS Microscopic Comment Dr. Lear Ng medical assistant, Ms. Alean Rinne, was notified of these results on August 21, 2018. Thressa Sheller MD Pathologist, Electronic Signature (Case signed 08/21/2018) Specimen Gross and Clinical Information  NAL DIAGNOSIS Diagnosis 08-01-18 Dr. Marland Kitchen [place]  1. Breast, lumpectomy, Right - DUCTAL CARCINOMA IN SITU WITH CALCIFICATIONS, HIGH GRADE, SPANNING 1.4 CM. - DUCTAL CARCINOMA IN SITU IS BROADLY LESS THAN 0.1 CM TO THE POSTERIOR MARGIN OF SPECIMEN 1. - SEE ONCOLOGY TABLE BELOW. 2. Breast, lumpectomy, Left - INVASIVE DUCTAL CARCINOMA, GRADE I/III, SPANNING 1.2 CM. - LOBULAR NEOPLASIA (ATYPICAL LOBULAR HYPERPLASIA). - INVASIVE CARCINOMA IS BROADLY 0.1 CM TO THE ANTERIOR MARGIN OF SPECIMEN 2. - SEE ONCOLOGY TABLE BELOW. 3. Breast, excision, Additional anterior margin left - RADIAL SCAR(S). - FIBROCYSTIC CHANGES. - THERE IS NO EVIDENCE OF MALIGNANCY. - SEE COMMENT. 4. Lymph node, sentinel, biopsy, Left axillary - THERE IS NO EVIDENCE OF CARCINOMA IN 1 OF 1 LYMPH NODE (0/1). Microscopic Comment 1. DCIS OF THE BREAST: Resection Procedure: Seed localized  lumpectomy. Specimen Laterality: Right.  Receptor Status: ER(100 % +), PR (100 % +), Her2-neu (- ratio 1.09), Ki-(3 %)  Right breast  Receptor Status: ER(94 % +), PR (90 % +), Her2-neu (- ratio 1.09), Ki-(3 %) Left breast   Past/Anticipated interventions by medical oncology, if any: Dr. Lindi Adie  Chemotherapy No  07-15-18 Genetic Testing negative  08-01-18 Oncotype DX test result 19/6% Adjuvant radiation therapy followed by Adjuvant antiestrogen therapy  Lymphedema issues, if any: No   ROM to left arm and right arm PT evaluation weekly treatment. Skin to left breast and left axilla and right breast all with skin intact healing with out signs of infections color normal. Follow up visit saw Dr. Excell Seltzer yesterday 10-07-18.  Pain issues, if any:  3/10 to right hip  SAFETY ISSUES:No  Prior radiation? : No  Pacemaker/ICD? :No  Possible current pregnancy?: No  Is the patient on methotrexate? : No  Menarche 11 G2 P2 BC yes LMP48 Menopause 46 HRT Yes  Current Complaints / other details: Family history of colon cancer and breast and breast cancer    Wt Readings from Last 3 Encounters:  10/08/18 177 lb 12.8 oz (80.6 kg)  09/16/18 179 lb 3.7 oz (81.3 kg)  08/20/18 176 lb 5.9 oz (80 kg)  BP 116/71 (BP Location: Left Arm, Patient Position: Sitting)   Pulse 85   Temp 98.4 F (36.9 C) (Oral)   Resp 18   Ht '5\' 4"'  (1.626 m)   Wt 177 lb 12.8 oz (80.6 kg)   SpO2 97%   BMI 30.52 kg/m  Georgena Spurling, RN 09/01/2018,1:52 PM

## 2018-10-06 ENCOUNTER — Ambulatory Visit: Payer: BLUE CROSS/BLUE SHIELD

## 2018-10-06 DIAGNOSIS — R293 Abnormal posture: Secondary | ICD-10-CM

## 2018-10-06 DIAGNOSIS — Z483 Aftercare following surgery for neoplasm: Secondary | ICD-10-CM

## 2018-10-06 DIAGNOSIS — R600 Localized edema: Secondary | ICD-10-CM

## 2018-10-06 DIAGNOSIS — Z170 Estrogen receptor positive status [ER+]: Secondary | ICD-10-CM

## 2018-10-06 DIAGNOSIS — C50412 Malignant neoplasm of upper-outer quadrant of left female breast: Secondary | ICD-10-CM

## 2018-10-06 NOTE — Therapy (Signed)
[place], [place], [address] Phone: [phone]   Fax:  [phone]  Physical Therapy Treatment  Patient Details  Name: Judith Mcneil MRN: [id number] Date of Birth: 1956/10/19 Referring Provider (PT): Dr. Excell Seltzer   Encounter Date: 10/06/2018  PT End of Session - 10/06/18 0847    Visit Number  7    Number of Visits  14    Date for PT Re-Evaluation  10/27/18    PT Start Time  0801    PT Stop Time  2563    PT Time Calculation (min)  46 min    Activity Tolerance  Patient tolerated treatment well    Behavior During Therapy  [facility] for tasks assessed/performed       Past Medical History:  Diagnosis Date  . Anxiety   . Family history of breast cancer   . Family history of colon cancer in father   . Medical history non-contributory   . PONV (postoperative nausea and vomiting)     Past Surgical History:  Procedure Laterality Date  . BREAST LUMPECTOMY WITH RADIOACTIVE SEED AND SENTINEL LYMPH NODE BIOPSY Left 08/01/2018   Procedure: LEFT BREAST LUMPECTOMY WITH RADIOACTIVE SEED AND SENTINEL LYMPH NODE BIOPSY;  Surgeon: Excell Seltzer, MD;  Location: [place];  Service: General;  Laterality: Left;  . BREAST LUMPECTOMY WITH RADIOACTIVE SEED LOCALIZATION Right 08/01/2018   Procedure: RIGHT BREAST LUMPECTOMY X 2  WITH RADIOACTIVE SEED RIGHT LOCALIZATION X 2;  Surgeon: Excell Seltzer, MD;  Location: [place];  Service: General;  Laterality: Right;  . COLONOSCOPY     x 2  . DILATATION & CURETTAGE/HYSTEROSCOPY WITH TRUECLEAR N/A 08/14/2013   Procedure: DILATATION & CURETTAGE/HYSTEROSCOPY WITH TRUECLEAR;  Surgeon: Logan Bores, MD;  Location: [facility];  Service: Gynecology;  Laterality: N/A;  1 hr in the OR  . RE-EXCISION OF BREAST LUMPECTOMY Right 08/20/2018   Procedure: RE-EXCISION OF BREAST LUMPECTOMY;  Surgeon: Excell Seltzer, MD;  Location: [place];  Service: General;  Laterality: Right;  . RE-EXCISION OF BREAST LUMPECTOMY Right 09/16/2018   Procedure: RE-EXCISION OF BREAST LUMPECTOMY;  Surgeon: Excell Seltzer, MD;  Location: [place];  Service: General;  Laterality: Right;  . TONSILLECTOMY     age 25    There were no vitals filed for this visit.  Subjective Assessment - 10/06/18 0804    Subjective  It feels looser since [place] worked on the scar tissue last week. I have my radiation simulation Wednesday.      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 She had a re-excision on the right breast on 08/20/18. It is ER/PR positive and HER2 negative with a Ki67 of 3%.     Patient Stated Goals  To get decreased swelling in her left breast    Currently in Pain?  No/denies                       [facility] Adult PT Treatment/Exercise - 10/06/18 0001      Manual Therapy   Manual Therapy  Manual Lymphatic Drainage (MLD);Myofascial release    Myofascial Release  to area of fibrosis under left lumpectomy scar in area of hardness    Manual Lymphatic Drainage (MLD)  short neck, 5 diaphragmatic breaths, left  inguinal nodes with left axillo- inguinal anastomosis, left breast , left lateral trunk, then repeated pathways                  PT Long Term Goals - 09/29/18 1022      PT LONG TERM GOAL #1   Title  Patient will demonstrate she has returned to bsaeline related to shoulder ROM and function.    Time  8    Period  Weeks    Status  Achieved      PT LONG TERM GOAL #2   Title  Patient will report >/= 50% improvement in left breast hardness and feeling of heaviness.    Baseline  60% improvement-09/11/18    Time  4    Period  Weeks    Status  Achieved      PT LONG TERM GOAL #3   Title  Improve  DASH score to be </= 8 for improved overall shoulder function.    Baseline  18.18, 09/29/18- 6.82    Time  4    Period  Weeks    Status  Achieved      PT LONG TERM GOAL #4   Title  Patient will verbalize good understanding of lymphedema / edema management including self manual lymph drainage.    Time  4    Period  Weeks    Status  Achieved      PT LONG TERM GOAL #5   Title  Pt will report a 75% improvement in hardness of scar tissue under left lumpectomy scar     Time  4    Period  Weeks    Status  New    Target Date  10/27/18            Plan - 10/06/18 0847    Clinical Impression Statement  Pt reported much relief from last session so continued with focus to decreasing scar tissue to incision along areolar and at axilla. Also continued with manual lymph drainage in varying positions. Pt has radiation simulation Wednesday and is probable to begin radiation treatments next week.    Rehab Potential  Good    Clinical Impairments Affecting Rehab Potential  pt beginning radiation    PT Frequency  1x / week    PT Duration  4 weeks    PT Treatment/Interventions  ADLs/Self Care Home Management;Therapeutic exercise;Patient/family education;Manual techniques;Passive range of motion;Manual lymph drainage;DME Instruction    PT Next Visit Plan  continue MLD to left breast and scar mobilization to left lumpectomy scar, assess for increase in swelling while pt goes through radiation    Consulted and Agree with Plan of Care  Patient       Patient will benefit from skilled therapeutic intervention in order to improve the following deficits and impairments:  Decreased knowledge of precautions, Impaired UE functional use, Decreased range of motion, Postural dysfunction, Pain, Increased edema  Visit Diagnosis: Localized edema  Aftercare following surgery for neoplasm  Abnormal posture  Malignant neoplasm of upper-outer quadrant of left breast in female, estrogen receptor positive  (HCC)     Problem List Patient Active Problem List   Diagnosis Date Noted  . Ductal carcinoma in situ (DCIS) of right breast 10/01/2018  . Genetic testing 07/22/2018  . Family history of breast cancer   . Family history of colon cancer in father   . Malignant neoplasm of upper-outer quadrant of left breast in female, estrogen receptor positive (HCC) 06/24/2018    Rosenberger, Valerie Ann,   PTA 10/06/2018, 8:52 AM  [facility] [address] [place], [place], [address] Phone: [phone]   Fax:  [phone]  Name: Judith Mcneil MRN: [id number] Date of Birth: 03/23/1956   

## 2018-10-08 ENCOUNTER — Ambulatory Visit
Admission: RE | Admit: 2018-10-08 | Discharge: 2018-10-08 | Disposition: A | Discharge status: Home / Self Care | Payer: BLUE CROSS/BLUE SHIELD | Source: Ambulatory Visit | Attending: Radiation Oncology | Admitting: Radiation Oncology

## 2018-10-08 ENCOUNTER — Encounter: Payer: Self-pay | Admitting: Radiation Oncology

## 2018-10-08 ENCOUNTER — Other Ambulatory Visit: Payer: Self-pay

## 2018-10-08 VITALS — BP 116/71 | HR 85 | Temp 98.4°F | Resp 18 | Ht 64.0 in | Wt 177.8 lb

## 2018-10-08 DIAGNOSIS — Z79899 Other long term (current) drug therapy: Secondary | ICD-10-CM | POA: Insufficient documentation

## 2018-10-08 DIAGNOSIS — Z170 Estrogen receptor positive status [ER+]: Secondary | ICD-10-CM | POA: Insufficient documentation

## 2018-10-08 DIAGNOSIS — C50412 Malignant neoplasm of upper-outer quadrant of left female breast: Secondary | ICD-10-CM

## 2018-10-08 DIAGNOSIS — Z510 Encounter for antineoplastic radiation therapy: Secondary | ICD-10-CM | POA: Diagnosis present

## 2018-10-08 DIAGNOSIS — D0511 Intraductal carcinoma in situ of right breast: Secondary | ICD-10-CM

## 2018-10-08 NOTE — Addendum Note (Signed)
Encounter addended by: Malena Edman, RN on: 10/08/2018 10:37 AM  Actions taken: Charge Capture section accepted

## 2018-10-08 NOTE — Progress Notes (Signed)
Radiation Oncology         [phone] ________________________________  Name: Judith Mcneil        MRN: [id number]  Date of Service: 10/08/2018 DOB: 05/25/1956  BW:Mcneil, Judith Shell, MD  Nicholas Lose, MD     REFERRING PHYSICIAN: Nicholas Lose, MD   DIAGNOSIS: The encounter diagnosis was Malignant neoplasm of upper-outer quadrant of left breast in female, estrogen receptor positive ([place]).   HISTORY OF PRESENT ILLNESS: Judith Mcneil is a 62 y.o. female originally seen in the multidisciplinary breast clinic for a new diagnosis of left breast cancer. The patient was noted to have a screening distortion and mass seen in the left breast on screening mammogram.  The patient underwent ultrasound but a mass could not be visualized, her axilla was negative for adenopathy, though distortion was seen on stereotactic approach to proceed with her biopsy on 06/19/2017.  Pathology from the biopsy obtained in the upper central breast revealed a grade 2 invasive ductal carcinoma, there was LCIS as well.  Her carcinoma was ER/PR positive, HER-2/neu negative, and her Ki-67 was 3%.  She underwent an MRI revealing a her known cancer in the left breast as well as a 1 cm lesion in the 7 mm lesion in the right breast.  She underwent bilateral lumpectomy on 08/01/2018, and the right breast she had a DCIS with calcifications high-grade measuring 1.4 cm and the DCIS was broadly less than 1 mm to the posterior margin.  Within the left breast, she had a grade one 1.2 cm invasive ductal carcinoma probably 1 mm to the anterior margin, additional anterior margin was also submitted within the specimen with fibrocystic changes and no evidence of malignancy, and her single left axillary node was negative for disease. She underwent reexcision of the right posterior margin on 08/20/2018 revealing residual high-grade DCIS extending to the posterior margin, and underwent another reexcision on 09/16/2018 of the right breast revealing  DCIS low-grade focally less than 1 mm to the posterior margin, and a second specimen of additional posterior tissue revealed no evidence of malignancy and benign skeletal muscle as well as benign fibroadipose tissue.  Her postop recovery was not delayed, her Oncotype score was 19, she will not receive any chemotherapy, and comes today to discuss the rationale for adjuvant radiotherapy to the bilateral breasts.  PREVIOUS RADIATION THERAPY: No   PAST MEDICAL HISTORY:  Past Medical History:  Diagnosis Date  . Anxiety   . Family history of breast cancer   . Family history of colon cancer in father   . Medical history non-contributory   . PONV (postoperative nausea and vomiting)        PAST SURGICAL HISTORY: Past Surgical History:  Procedure Laterality Date  . BREAST LUMPECTOMY WITH RADIOACTIVE SEED AND SENTINEL LYMPH NODE BIOPSY Left 08/01/2018   Procedure: LEFT BREAST LUMPECTOMY WITH RADIOACTIVE SEED AND SENTINEL LYMPH NODE BIOPSY;  Surgeon: Excell Seltzer, MD;  Location: [place];  Service: General;  Laterality: Left;  . BREAST LUMPECTOMY WITH RADIOACTIVE SEED LOCALIZATION Right 08/01/2018   Procedure: RIGHT BREAST LUMPECTOMY X 2  WITH RADIOACTIVE SEED RIGHT LOCALIZATION X 2;  Surgeon: Excell Seltzer, MD;  Location: [place];  Service: General;  Laterality: Right;  . COLONOSCOPY     x 2  . DILATATION & CURETTAGE/HYSTEROSCOPY WITH TRUECLEAR N/A 08/14/2013   Procedure: DILATATION & CURETTAGE/HYSTEROSCOPY WITH TRUECLEAR;  Surgeon: Logan Bores, MD;  Location: [facility];  Service: Gynecology;  Laterality: N/A;  1  hr in the OR  . RE-EXCISION OF BREAST LUMPECTOMY Right 08/20/2018   Procedure: RE-EXCISION OF BREAST LUMPECTOMY;  Surgeon: Excell Seltzer, MD;  Location: [place];  Service: General;  Laterality: Right;  . RE-EXCISION OF BREAST LUMPECTOMY Right 09/16/2018   Procedure: RE-EXCISION OF BREAST LUMPECTOMY;  Surgeon: Excell Seltzer, MD;  Location: [place];  Service: General;  Laterality: Right;  . TONSILLECTOMY     age 42     FAMILY HISTORY:  Family History  Problem Relation Age of Onset  . Alzheimer's disease Mother   . Colon cancer Father        dx mid 80s  . Breast cancer Paternal Grandmother        dx 38s  . Breast cancer Paternal Aunt        dx 46s  . Schizophrenia Daughter      SOCIAL HISTORY:  reports that she has never smoked. She has never used smokeless tobacco. She reports that she drinks about 1.0 standard drinks of alcohol per week. She reports that she does not use drugs.  The patient is widowed she lives in [place].  She works for [employer].   ALLERGIES: Patient has no known allergies.   MEDICATIONS:  Current Outpatient Medications  Medication Sig Dispense Refill  . ALPRAZolam (XANAX) 0.25 MG tablet Take 0.25 mg by mouth at bedtime as needed for anxiety.    Marland Kitchen FLUoxetine (PROZAC) 10 MG capsule Take 10 mg by mouth daily.    Marland Kitchen ibuprofen (ADVIL,MOTRIN) 200 MG tablet Take 400 mg by mouth every 6 (six) hours as needed.    . loratadine (CLARITIN) 10 MG tablet Take 10 mg by mouth daily.    Marland Kitchen oxyCODONE (OXY IR/ROXICODONE) 5 MG immediate release tablet Take 1 tablet (5 mg total) by mouth every 6 (six) hours as needed for moderate pain, severe pain or breakthrough pain. 12 tablet 0  . valACYclovir (VALTREX) 1000 MG tablet Take 1,000 mg by mouth daily as needed (outbreak).     No current facility-administered medications for this encounter.      REVIEW OF SYSTEMS: On review of systems, the patient reports that she is doing well overall. Her surgical sites are healing well. She is managing some edema in the breast with compression and PT on the left. She denies any chest pain, shortness of breath, cough, fevers, chills, night sweats, unintended weight changes. She denies any bowel or bladder disturbances, and denies abdominal pain, nausea or vomiting. She denies any new  musculoskeletal or joint aches or pains. A complete review of systems is obtained and is otherwise negative.     PHYSICAL EXAM:  Wt Readings from Last 3 Encounters:  09/16/18 179 lb 3.7 oz (81.3 kg)  08/20/18 176 lb 5.9 oz (80 kg)  08/11/18 177 lb 4.8 oz (80.4 kg)   Temp Readings from Last 3 Encounters:  09/16/18 97.7 F (36.5 C)  08/20/18 97.7 F (36.5 C) (Oral)  08/11/18 98.6 F (37 C) (Oral)   BP Readings from Last 3 Encounters:  09/16/18 (!) 147/86  08/20/18 123/77  08/11/18 119/71   Pulse Readings from Last 3 Encounters:  09/16/18 83  08/20/18 99  08/11/18 79     In general this is a well appearing Caucasian female in no acute distress. She is alert and oriented x4 and appropriate throughout the examination. HEENT reveals that the patient is normocephalic, atraumatic. EOMs are intact.  Cardiopulmonary assessment is negative for acute distress and sje exhibits  normal effort.  Bilateral breasts are examined, and reveal well-healing lumpectomy sites as well as a left axillary incision.  No erythema or fluctuance is identified.   ECOG = 0  0 - Asymptomatic (Fully active, able to carry on all predisease activities without restriction)  1 - Symptomatic but completely ambulatory (Restricted in physically strenuous activity but ambulatory and able to carry out work of a light or sedentary nature. For example, light housework, office work)  2 - Symptomatic, <50% in bed during the day (Ambulatory and capable of all self care but unable to carry out any work activities. Up and about more than 50% of waking hours)  3 - Symptomatic, >50% in bed, but not bedbound (Capable of only limited self-care, confined to bed or chair 50% or more of waking hours)  4 - Bedbound (Completely disabled. Cannot carry on any self-care. Totally confined to bed or chair)  5 - Death   Eustace Pen MM, Creech RH, Tormey DC, et al. [phone]). "Toxicity and response criteria of the [facility]  Group". [place] Oncol. 5 (6): 649-55    LABORATORY DATA:  Lab Results  Component Value Date   WBC 6.9 06/25/2018   HGB 13.9 06/25/2018   HCT 41.7 06/25/2018   MCV 92.8 06/25/2018   PLT 262 06/25/2018   Lab Results  Component Value Date   NA 139 06/25/2018   K 4.4 06/25/2018   CL 108 06/25/2018   CO2 26 06/25/2018   Lab Results  Component Value Date   ALT 25 06/25/2018   AST 19 06/25/2018   ALKPHOS 90 06/25/2018   BILITOT 0.5 06/25/2018      RADIOGRAPHY: No results found.     IMPRESSION/PLAN: 1. Stage IA, [id number], grade 1, ER/PR positive invasive ductal carcinoma with LCIS of the left breast with high grade, ER/PR positive DCIS of the right breast. Dr. Lisbeth Renshaw discusses the updated pathology information given her bilateral findings, and discusses the rationale for radiotherapy in patients who undergo breast conservation surgery. We discussed the risks, benefits, short, and long term effects of radiotherapy, and the patient is interested in proceeding. Dr. Lisbeth Renshaw discusses the delivery and logistics of radiotherapy and recommends a course of 4  weeks of radiotherapy. Written consent is obtained and placed in the chart, a copy was provided to the patient.  She will simulate following this morning's visit.  In a visit lasting 25 minutes, greater than 50% of the time was spent face to face discussing her case, and coordinating the patient's care.  The above documentation reflects my direct findings during this shared patient visit. Please see the separate note by Dr. Lisbeth Renshaw on this date for the remainder of the patient's plan of care.    Carola Rhine, PAC

## 2018-10-09 ENCOUNTER — Telehealth: Payer: Self-pay | Admitting: Hematology and Oncology

## 2018-10-09 NOTE — Telephone Encounter (Signed)
Appt scheduled patient notified LMVM with date/time per 10/17 sch msg

## 2018-10-13 ENCOUNTER — Ambulatory Visit: Payer: BLUE CROSS/BLUE SHIELD | Admitting: Physical Therapy

## 2018-10-20 ENCOUNTER — Ambulatory Visit: Payer: BLUE CROSS/BLUE SHIELD

## 2018-10-20 ENCOUNTER — Ambulatory Visit
Admission: RE | Admit: 2018-10-20 | Discharge: 2018-10-20 | Disposition: A | Discharge status: Home / Self Care | Payer: BLUE CROSS/BLUE SHIELD | Source: Ambulatory Visit | Attending: Radiation Oncology | Admitting: Radiation Oncology

## 2018-10-20 DIAGNOSIS — C50412 Malignant neoplasm of upper-outer quadrant of left female breast: Secondary | ICD-10-CM | POA: Diagnosis not present

## 2018-10-21 ENCOUNTER — Ambulatory Visit
Admission: RE | Admit: 2018-10-21 | Discharge: 2018-10-21 | Disposition: A | Discharge status: Home / Self Care | Payer: BLUE CROSS/BLUE SHIELD | Source: Ambulatory Visit | Attending: Radiation Oncology | Admitting: Radiation Oncology

## 2018-10-21 DIAGNOSIS — C50412 Malignant neoplasm of upper-outer quadrant of left female breast: Secondary | ICD-10-CM | POA: Diagnosis not present

## 2018-10-22 ENCOUNTER — Ambulatory Visit
Admission: RE | Admit: 2018-10-22 | Discharge: 2018-10-22 | Disposition: A | Discharge status: Home / Self Care | Payer: BLUE CROSS/BLUE SHIELD | Source: Ambulatory Visit | Attending: Radiation Oncology | Admitting: Radiation Oncology

## 2018-10-22 DIAGNOSIS — C50412 Malignant neoplasm of upper-outer quadrant of left female breast: Secondary | ICD-10-CM | POA: Diagnosis not present

## 2018-10-22 DIAGNOSIS — Z170 Estrogen receptor positive status [ER+]: Secondary | ICD-10-CM

## 2018-10-22 MED ORDER — ALRA NON-METALLIC DEODORANT (RAD-ONC)
1.0000 "application " | Freq: Once | TOPICAL | Status: AC
  Administered 2018-10-22: 1 via TOPICAL

## 2018-10-22 MED ORDER — RADIAPLEXRX EX GEL
Freq: Two times a day (BID) | CUTANEOUS | Status: DC
  Administered 2018-10-22: 17:00:00 via TOPICAL

## 2018-10-23 ENCOUNTER — Ambulatory Visit
Admission: RE | Admit: 2018-10-23 | Discharge: 2018-10-23 | Disposition: A | Discharge status: Home / Self Care | Payer: BLUE CROSS/BLUE SHIELD | Source: Ambulatory Visit | Attending: Radiation Oncology | Admitting: Radiation Oncology

## 2018-10-23 DIAGNOSIS — C50412 Malignant neoplasm of upper-outer quadrant of left female breast: Secondary | ICD-10-CM | POA: Diagnosis not present

## 2018-10-24 ENCOUNTER — Ambulatory Visit
Admission: RE | Admit: 2018-10-24 | Discharge: 2018-10-24 | Disposition: A | Discharge status: Home / Self Care | Payer: BLUE CROSS/BLUE SHIELD | Source: Ambulatory Visit | Attending: Radiation Oncology | Admitting: Radiation Oncology

## 2018-10-24 DIAGNOSIS — Z170 Estrogen receptor positive status [ER+]: Secondary | ICD-10-CM | POA: Insufficient documentation

## 2018-10-24 DIAGNOSIS — Z510 Encounter for antineoplastic radiation therapy: Secondary | ICD-10-CM | POA: Diagnosis present

## 2018-10-24 DIAGNOSIS — C50412 Malignant neoplasm of upper-outer quadrant of left female breast: Secondary | ICD-10-CM | POA: Insufficient documentation

## 2018-10-27 ENCOUNTER — Ambulatory Visit
Admission: RE | Admit: 2018-10-27 | Discharge: 2018-10-27 | Disposition: A | Discharge status: Home / Self Care | Payer: BLUE CROSS/BLUE SHIELD | Source: Ambulatory Visit | Attending: Radiation Oncology | Admitting: Radiation Oncology

## 2018-10-27 DIAGNOSIS — C50412 Malignant neoplasm of upper-outer quadrant of left female breast: Secondary | ICD-10-CM | POA: Diagnosis not present

## 2018-10-28 ENCOUNTER — Ambulatory Visit
Admission: RE | Admit: 2018-10-28 | Discharge: 2018-10-28 | Disposition: A | Discharge status: Home / Self Care | Payer: BLUE CROSS/BLUE SHIELD | Source: Ambulatory Visit | Attending: Radiation Oncology | Admitting: Radiation Oncology

## 2018-10-28 ENCOUNTER — Ambulatory Visit: Payer: BLUE CROSS/BLUE SHIELD | Attending: General Surgery | Admitting: Physical Therapy

## 2018-10-28 DIAGNOSIS — Z483 Aftercare following surgery for neoplasm: Secondary | ICD-10-CM | POA: Diagnosis present

## 2018-10-28 DIAGNOSIS — Z170 Estrogen receptor positive status [ER+]: Secondary | ICD-10-CM | POA: Insufficient documentation

## 2018-10-28 DIAGNOSIS — C50412 Malignant neoplasm of upper-outer quadrant of left female breast: Secondary | ICD-10-CM | POA: Insufficient documentation

## 2018-10-28 DIAGNOSIS — R293 Abnormal posture: Secondary | ICD-10-CM | POA: Insufficient documentation

## 2018-10-28 DIAGNOSIS — R600 Localized edema: Secondary | ICD-10-CM | POA: Diagnosis not present

## 2018-10-28 NOTE — Addendum Note (Signed)
Addended by: Jomarie Longs on: 10/28/2018 12:53 PM   Modules accepted: Orders

## 2018-10-28 NOTE — Therapy (Signed)
[place], [place], [address] Phone: [phone]   Fax:  [phone]  Physical Therapy Treatment  Patient Details  Name: Judith Mcneil MRN: [id number] Date of Birth: October 20, 1956 Referring Provider (PT): Dr. Excell Seltzer   Encounter Date: 10/28/2018  PT End of Session - 10/28/18 1234    Visit Number  8    Number of Visits  14    Date for PT Re-Evaluation  11/23/18    PT Start Time  0938    PT Stop Time  1018    PT Time Calculation (min)  40 min    Activity Tolerance  Patient tolerated treatment well    Behavior During Therapy  [facility] for tasks assessed/performed       Past Medical History:  Diagnosis Date  . Anxiety   . Family history of breast cancer   . Family history of colon cancer in father   . Medical history non-contributory   . PONV (postoperative nausea and vomiting)     Past Surgical History:  Procedure Laterality Date  . BREAST LUMPECTOMY WITH RADIOACTIVE SEED AND SENTINEL LYMPH NODE BIOPSY Left 08/01/2018   Procedure: LEFT BREAST LUMPECTOMY WITH RADIOACTIVE SEED AND SENTINEL LYMPH NODE BIOPSY;  Surgeon: Excell Seltzer, MD;  Location: [place];  Service: General;  Laterality: Left;  . BREAST LUMPECTOMY WITH RADIOACTIVE SEED LOCALIZATION Right 08/01/2018   Procedure: RIGHT BREAST LUMPECTOMY X 2  WITH RADIOACTIVE SEED RIGHT LOCALIZATION X 2;  Surgeon: Excell Seltzer, MD;  Location: [place];  Service: General;  Laterality: Right;  . COLONOSCOPY     x 2  . DILATATION & CURETTAGE/HYSTEROSCOPY WITH TRUECLEAR N/A 08/14/2013   Procedure: DILATATION & CURETTAGE/HYSTEROSCOPY WITH TRUECLEAR;  Surgeon: Logan Bores, MD;  Location: [facility];  Service: Gynecology;  Laterality: N/A;  1 hr in the OR  . RE-EXCISION OF BREAST LUMPECTOMY Right 08/20/2018   Procedure: RE-EXCISION OF BREAST LUMPECTOMY;  Surgeon: Excell Seltzer, MD;  Location: [place];  Service: General;  Laterality: Right;  . RE-EXCISION OF BREAST LUMPECTOMY Right 09/16/2018   Procedure: RE-EXCISION OF BREAST LUMPECTOMY;  Surgeon: Excell Seltzer, MD;  Location: [place];  Service: General;  Laterality: Right;  . TONSILLECTOMY     age 62    There were no vitals filed for this visit.  Subjective Assessment - 10/28/18 0940    Subjective  I've been through my first week of radiation.  I'm breaking out between my breasts. I think it's because I've been wearing that compression bra and I sweat and it's broken out.  I'm getting some discoloration already.    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 She had a re-excision on the right breast on 08/20/18. It is ER/PR positive and HER2 negative with a Ki67 of 3%.     Patient Stated Goals  To get decreased swelling in her left breast    Currently in Pain?  No/denies                       [facility] Adult PT Treatment/Exercise - 10/28/18 0001      Exercises   Exercises  Other Exercises    Other Exercises   Discussed with pt. that she should continue shoulder/chest stretches through the course  of radiation and beyond.      Manual Therapy   Manual Therapy  Soft tissue mobilization;Other (comment)    Soft tissue mobilization  around breast incision    Manual Lymphatic Drainage (MLD)  short neck, superficial and deep abdomen, left inguinal nodes and axillo-inguinal anastomosis, then left breast directing towards pathway; reinforced axillo-inguinal pathway.  Then to right sidelying for left lateral breast and axillo-inguinal anastomosis.    Other Manual Therapy  cut TG soft and rolled it up, then placed the roll in a 4-inch strip of TG soft for patient to place between breasts to help with  perspiration causing skin irritation there.                   PT Long Term Goals - 09/29/18 1022      PT LONG TERM GOAL #1   Title  Patient will demonstrate she has returned to bsaeline related to shoulder ROM and function.    Time  8    Period  Weeks    Status  Achieved      PT LONG TERM GOAL #2   Title  Patient will report >/= 50% improvement in left breast hardness and feeling of heaviness.    Baseline  60% improvement-09/11/18    Time  4    Period  Weeks    Status  Achieved      PT LONG TERM GOAL #3   Title  Improve DASH score to be </= 8 for improved overall shoulder function.    Baseline  18.18, 09/29/18- 6.82    Time  4    Period  Weeks    Status  Achieved      PT LONG TERM GOAL #4   Title  Patient will verbalize good understanding of lymphedema / edema management including self manual lymph drainage.    Time  4    Period  Weeks    Status  Achieved      PT LONG TERM GOAL #5   Title  Pt will report a 75% improvement in hardness of scar tissue under left lumpectomy scar     Time  4    Period  Weeks    Status  New    Target Date  10/27/18            Plan - 10/28/18 1234    Clinical Impression Statement  Pt. started radiation and seems to have an increase in her left breast swelling today; explained to her that it may be just a normal up and down of swelling rather than a result of radiation. She has also developed a skin breakout between her breasts and was advised to ask her rad onc team about this.. She noted some benefit from manual lymph drainage by the end of her session today, in terms of greater softness of tissue.    Rehab Potential  Good    Clinical Impairments Affecting Rehab Potential  pt beginning radiation    PT Frequency  1x / week    PT Duration  4 weeks    PT Treatment/Interventions  ADLs/Self Care Home Management;Therapeutic exercise;Patient/family education;Manual techniques;Passive range of motion;Manual lymph drainage;DME  Instruction    PT Next Visit Plan  continue MLD to left breast and scar mobilization to left lumpectomy scar, assess for increase in swelling while pt goes through radiation    PT Home Exercise Plan  Post op shoulder ROM HEP    Consulted and Agree with Plan of Care  Patient       Patient will benefit from skilled therapeutic intervention in order to improve the following deficits and impairments:  Decreased knowledge of precautions, Impaired UE functional use, Decreased range of motion, Postural dysfunction, Pain, Increased edema  Visit Diagnosis: Localized edema  Aftercare following surgery for neoplasm     Problem List Patient Active Problem List   Diagnosis Date Noted  . Ductal carcinoma in situ (DCIS) of right breast 10/01/2018  . Genetic testing 07/22/2018  . Family history of breast cancer   . Family history of colon cancer in father   . Malignant neoplasm of upper-outer quadrant of left breast in female, estrogen receptor positive ([place]) 06/24/2018    Nyxon Strupp 10/28/2018, 12:38 PM  [place], [place], [address] Phone: [phone]   Fax:  [phone]  Name: AVERLEIGH SAVARY MRN: [id number] Date of Birth: [birth year]  Serafina Royals, PT 10/28/18 12:38 PM

## 2018-10-29 ENCOUNTER — Ambulatory Visit
Admission: RE | Admit: 2018-10-29 | Discharge: 2018-10-29 | Disposition: A | Discharge status: Home / Self Care | Payer: BLUE CROSS/BLUE SHIELD | Source: Ambulatory Visit | Attending: Radiation Oncology | Admitting: Radiation Oncology

## 2018-10-29 DIAGNOSIS — C50412 Malignant neoplasm of upper-outer quadrant of left female breast: Secondary | ICD-10-CM | POA: Diagnosis not present

## 2018-10-30 ENCOUNTER — Ambulatory Visit: Payer: BLUE CROSS/BLUE SHIELD

## 2018-10-31 ENCOUNTER — Ambulatory Visit
Admission: RE | Admit: 2018-10-31 | Discharge: 2018-10-31 | Disposition: A | Discharge status: Home / Self Care | Payer: BLUE CROSS/BLUE SHIELD | Source: Ambulatory Visit | Attending: Radiation Oncology | Admitting: Radiation Oncology

## 2018-10-31 DIAGNOSIS — C50412 Malignant neoplasm of upper-outer quadrant of left female breast: Secondary | ICD-10-CM | POA: Diagnosis not present

## 2018-11-03 ENCOUNTER — Ambulatory Visit
Admission: RE | Admit: 2018-11-03 | Discharge: 2018-11-03 | Disposition: A | Discharge status: Home / Self Care | Payer: BLUE CROSS/BLUE SHIELD | Source: Ambulatory Visit | Attending: Radiation Oncology | Admitting: Radiation Oncology

## 2018-11-03 DIAGNOSIS — C50412 Malignant neoplasm of upper-outer quadrant of left female breast: Secondary | ICD-10-CM | POA: Diagnosis not present

## 2018-11-04 ENCOUNTER — Ambulatory Visit: Payer: BLUE CROSS/BLUE SHIELD

## 2018-11-04 ENCOUNTER — Ambulatory Visit
Admission: RE | Admit: 2018-11-04 | Discharge: 2018-11-04 | Disposition: A | Discharge status: Home / Self Care | Payer: BLUE CROSS/BLUE SHIELD | Source: Ambulatory Visit | Attending: Radiation Oncology | Admitting: Radiation Oncology

## 2018-11-04 DIAGNOSIS — R600 Localized edema: Secondary | ICD-10-CM

## 2018-11-04 DIAGNOSIS — R293 Abnormal posture: Secondary | ICD-10-CM

## 2018-11-04 DIAGNOSIS — Z170 Estrogen receptor positive status [ER+]: Secondary | ICD-10-CM

## 2018-11-04 DIAGNOSIS — C50412 Malignant neoplasm of upper-outer quadrant of left female breast: Secondary | ICD-10-CM

## 2018-11-04 DIAGNOSIS — Z483 Aftercare following surgery for neoplasm: Secondary | ICD-10-CM

## 2018-11-04 NOTE — Therapy (Signed)
[place], [place], [address] Phone: [phone]   Fax:  [phone]  Physical Therapy Treatment  Patient Details  Name: Judith Mcneil MRN: [id number] Date of Birth: 01-05-1956 Referring Provider (PT): Dr. Excell Seltzer   Encounter Date: 11/04/2018  PT End of Session - 11/04/18 0934    Visit Number  9    Number of Visits  14    Date for PT Re-Evaluation  11/23/18    PT Start Time  0848    PT Stop Time  0933    PT Time Calculation (min)  45 min    Activity Tolerance  Patient tolerated treatment well    Behavior During Therapy  [facility] for tasks assessed/performed       Past Medical History:  Diagnosis Date  . Anxiety   . Family history of breast cancer   . Family history of colon cancer in father   . Medical history non-contributory   . PONV (postoperative nausea and vomiting)     Past Surgical History:  Procedure Laterality Date  . BREAST LUMPECTOMY WITH RADIOACTIVE SEED AND SENTINEL LYMPH NODE BIOPSY Left 08/01/2018   Procedure: LEFT BREAST LUMPECTOMY WITH RADIOACTIVE SEED AND SENTINEL LYMPH NODE BIOPSY;  Surgeon: Excell Seltzer, MD;  Location: [place];  Service: General;  Laterality: Left;  . BREAST LUMPECTOMY WITH RADIOACTIVE SEED LOCALIZATION Right 08/01/2018   Procedure: RIGHT BREAST LUMPECTOMY X 2  WITH RADIOACTIVE SEED RIGHT LOCALIZATION X 2;  Surgeon: Excell Seltzer, MD;  Location: [place];  Service: General;  Laterality: Right;  . COLONOSCOPY     x 2  . DILATATION & CURETTAGE/HYSTEROSCOPY WITH TRUECLEAR N/A 08/14/2013   Procedure: DILATATION & CURETTAGE/HYSTEROSCOPY WITH TRUECLEAR;  Surgeon: Logan Bores, MD;  Location: [facility];  Service: Gynecology;  Laterality: N/A;  1 hr in the OR  . RE-EXCISION OF BREAST LUMPECTOMY Right 08/20/2018   Procedure: RE-EXCISION OF BREAST LUMPECTOMY;  Surgeon: Excell Seltzer, MD;  Location: [place];  Service: General;  Laterality: Right;  . RE-EXCISION OF BREAST LUMPECTOMY Right 09/16/2018   Procedure: RE-EXCISION OF BREAST LUMPECTOMY;  Surgeon: Excell Seltzer, MD;  Location: [place];  Service: General;  Laterality: Right;  . TONSILLECTOMY     age 62    There were no vitals filed for this visit.  Subjective Assessment - 11/04/18 0852    Subjective  My rash is alot better between my breasts. I've been using the soft sock Butch Penny gave me last time and that's been helping. I still have the dark spot at my lateral breast.     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 She had a re-excision on the right breast on 08/20/18. It is ER/PR positive and HER2 negative with a Ki67 of 3%.     Patient Stated Goals  To get decreased swelling in her left breast    Currently in Pain?  No/denies                       [facility] Adult PT Treatment/Exercise - 11/04/18 0001      Manual Therapy   Soft tissue mobilization  around breast incision    Manual Lymphatic Drainage (MLD)  short neck, superficial and deep abdomen, left inguinal nodes and axillo-inguinal  anastomosis, then left breast directing towards pathway; reinforced axillo-inguinal pathway.  Then to right sidelying for left lateral breast and axillo-inguinal anastomosis.                  PT Long Term Goals - 09/29/18 1022      PT LONG TERM GOAL #1   Title  Patient will demonstrate she has returned to bsaeline related to shoulder ROM and function.    Time  8    Period  Weeks    Status  Achieved      PT LONG TERM GOAL #2   Title  Patient will report >/= 50% improvement in left breast hardness and feeling of heaviness.    Baseline  60% improvement-09/11/18    Time  4    Period  Weeks    Status   Achieved      PT LONG TERM GOAL #3   Title  Improve DASH score to be </= 8 for improved overall shoulder function.    Baseline  18.18, 09/29/18- 6.82    Time  4    Period  Weeks    Status  Achieved      PT LONG TERM GOAL #4   Title  Patient will verbalize good understanding of lymphedema / edema management including self manual lymph drainage.    Time  4    Period  Weeks    Status  Achieved      PT LONG TERM GOAL #5   Title  Pt will report a 75% improvement in hardness of scar tissue under left lumpectomy scar     Time  4    Period  Weeks    Status  New    Target Date  10/27/18            Plan - 11/04/18 0934    Clinical Impression Statement  Pts rash between breasts is much improved today and she reports minimal change with swelling as of today. Felt much better after last session so continued with same treatment of manual lymph drainage and scar tissue mobilization. Her skin is still doing well other than dark spot lateral to areolar but no tenderness reported by pt and no openings.     Rehab Potential  Good    Clinical Impairments Affecting Rehab Potential  pt beginning radiation    PT Frequency  1x / week    PT Duration  4 weeks    PT Treatment/Interventions  ADLs/Self Care Home Management;Therapeutic exercise;Patient/family education;Manual techniques;Passive range of motion;Manual lymph drainage;DME Instruction    PT Next Visit Plan  continue MLD to left breast and scar mobilization to left lumpectomy scar, assess for increase in swelling while pt goes through radiation    Consulted and Agree with Plan of Care  Patient       Patient will benefit from skilled therapeutic intervention in order to improve the following deficits and impairments:  Decreased knowledge of precautions, Impaired UE functional use, Decreased range of motion, Postural dysfunction, Pain, Increased edema  Visit Diagnosis: Localized edema  Aftercare following surgery for neoplasm  Abnormal  posture  Malignant neoplasm of upper-outer quadrant of left breast in female, estrogen receptor positive ([place])     Problem List Patient Active Problem List   Diagnosis Date Noted  . Ductal carcinoma in situ (DCIS) of right breast 10/01/2018  . Genetic testing 07/22/2018  . Family history of breast cancer   . Family history of colon cancer in father   .  Malignant neoplasm of upper-outer quadrant of left breast in female, estrogen receptor positive ([place]) 06/24/2018    Otelia Limes, PTA 11/04/2018, 10:00 AM  [place], [place], [address] Phone: [phone]   Fax:  [phone]  Name: CARALYNN GELBER MRN: [id number] Date of Birth: 1956/08/19

## 2018-11-05 ENCOUNTER — Ambulatory Visit
Admission: RE | Admit: 2018-11-05 | Discharge: 2018-11-05 | Disposition: A | Discharge status: Home / Self Care | Payer: BLUE CROSS/BLUE SHIELD | Source: Ambulatory Visit | Attending: Radiation Oncology | Admitting: Radiation Oncology

## 2018-11-05 DIAGNOSIS — C50412 Malignant neoplasm of upper-outer quadrant of left female breast: Secondary | ICD-10-CM | POA: Diagnosis not present

## 2018-11-06 ENCOUNTER — Encounter: Payer: Self-pay | Admitting: Emergency Medicine

## 2018-11-06 ENCOUNTER — Ambulatory Visit
Admission: RE | Admit: 2018-11-06 | Discharge: 2018-11-06 | Disposition: A | Discharge status: Home / Self Care | Payer: BLUE CROSS/BLUE SHIELD | Source: Ambulatory Visit | Attending: Radiation Oncology | Admitting: Radiation Oncology

## 2018-11-06 DIAGNOSIS — F4322 Adjustment disorder with anxiety: Secondary | ICD-10-CM

## 2018-11-06 DIAGNOSIS — C50412 Malignant neoplasm of upper-outer quadrant of left female breast: Secondary | ICD-10-CM | POA: Diagnosis not present

## 2018-11-07 ENCOUNTER — Ambulatory Visit
Admission: RE | Admit: 2018-11-07 | Discharge: 2018-11-07 | Disposition: A | Discharge status: Home / Self Care | Payer: BLUE CROSS/BLUE SHIELD | Source: Ambulatory Visit | Attending: Radiation Oncology | Admitting: Radiation Oncology

## 2018-11-07 ENCOUNTER — Ambulatory Visit: Payer: BLUE CROSS/BLUE SHIELD | Admitting: Radiation Oncology

## 2018-11-07 DIAGNOSIS — C50412 Malignant neoplasm of upper-outer quadrant of left female breast: Secondary | ICD-10-CM | POA: Diagnosis not present

## 2018-11-10 ENCOUNTER — Ambulatory Visit
Admission: RE | Admit: 2018-11-10 | Discharge: 2018-11-10 | Disposition: A | Discharge status: Home / Self Care | Payer: BLUE CROSS/BLUE SHIELD | Source: Ambulatory Visit | Attending: Radiation Oncology | Admitting: Radiation Oncology

## 2018-11-10 DIAGNOSIS — C50412 Malignant neoplasm of upper-outer quadrant of left female breast: Secondary | ICD-10-CM | POA: Diagnosis not present

## 2018-11-11 ENCOUNTER — Encounter: Payer: Self-pay | Admitting: Physical Therapy

## 2018-11-11 ENCOUNTER — Ambulatory Visit
Admission: RE | Admit: 2018-11-11 | Discharge: 2018-11-11 | Disposition: A | Discharge status: Home / Self Care | Payer: BLUE CROSS/BLUE SHIELD | Source: Ambulatory Visit | Attending: Radiation Oncology | Admitting: Radiation Oncology

## 2018-11-11 ENCOUNTER — Other Ambulatory Visit: Payer: Self-pay

## 2018-11-11 ENCOUNTER — Ambulatory Visit: Payer: BLUE CROSS/BLUE SHIELD | Admitting: Physical Therapy

## 2018-11-11 DIAGNOSIS — C50412 Malignant neoplasm of upper-outer quadrant of left female breast: Secondary | ICD-10-CM | POA: Diagnosis not present

## 2018-11-11 DIAGNOSIS — R600 Localized edema: Secondary | ICD-10-CM | POA: Diagnosis not present

## 2018-11-11 NOTE — Therapy (Signed)
[place], [place], [address] Phone: [phone]   Fax:  [phone]  Physical Therapy Treatment  Patient Details  Name: Judith Mcneil MRN: [id number] Date of Birth: Feb 14, 1956 Referring Provider (PT): Dr. Excell Seltzer   Encounter Date: 11/11/2018  PT End of Session - 11/11/18 1215    Visit Number  10    Number of Visits  14    Date for PT Re-Evaluation  11/23/18    PT Start Time  0846    PT Stop Time  0931    PT Time Calculation (min)  45 min    Activity Tolerance  Patient tolerated treatment well    Behavior During Therapy  [facility] for tasks assessed/performed       Past Medical History:  Diagnosis Date  . Anxiety   . Family history of breast cancer   . Family history of colon cancer in father   . Medical history non-contributory   . PONV (postoperative nausea and vomiting)     Past Surgical History:  Procedure Laterality Date  . BREAST LUMPECTOMY WITH RADIOACTIVE SEED AND SENTINEL LYMPH NODE BIOPSY Left 08/01/2018   Procedure: LEFT BREAST LUMPECTOMY WITH RADIOACTIVE SEED AND SENTINEL LYMPH NODE BIOPSY;  Surgeon: Excell Seltzer, MD;  Location: [place];  Service: General;  Laterality: Left;  . BREAST LUMPECTOMY WITH RADIOACTIVE SEED LOCALIZATION Right 08/01/2018   Procedure: RIGHT BREAST LUMPECTOMY X 2  WITH RADIOACTIVE SEED RIGHT LOCALIZATION X 2;  Surgeon: Excell Seltzer, MD;  Location: [place];  Service: General;  Laterality: Right;  . COLONOSCOPY     x 2  . DILATATION & CURETTAGE/HYSTEROSCOPY WITH TRUECLEAR N/A 08/14/2013   Procedure: DILATATION & CURETTAGE/HYSTEROSCOPY WITH TRUECLEAR;  Surgeon: Logan Bores, MD;  Location: [facility];  Service: Gynecology;  Laterality: N/A;  1 hr in the OR  . RE-EXCISION OF BREAST LUMPECTOMY Right 08/20/2018   Procedure: RE-EXCISION OF BREAST LUMPECTOMY;  Surgeon: Excell Seltzer, MD;  Location: [place];  Service: General;  Laterality: Right;  . RE-EXCISION OF BREAST LUMPECTOMY Right 09/16/2018   Procedure: RE-EXCISION OF BREAST LUMPECTOMY;  Surgeon: Excell Seltzer, MD;  Location: [facility];  Service: General;  Laterality: Right;  . TONSILLECTOMY     age 1    There were no vitals filed for this visit.  Subjective Assessment - 11/11/18 0849    Subjective  My breast feels harder now but I am not wearing my compression bra because it is too uncomfortable on my skin. I finish radiation next Tuesday.     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 She had a re-excision on the right breast on 08/20/18. It is ER/PR positive and HER2 negative with a Ki67 of 3%.     Patient Stated Goals  To get decreased swelling in her left breast    Currently in Pain?  No/denies    Pain Score  0-No pain                       [facility] Adult PT Treatment/Exercise - 11/11/18 0001      Manual Therapy   Manual Therapy  Edema management;Manual Lymphatic Drainage (MLD);Soft tissue mobilization    Edema Management  created small chip pack for pt to wear in  her camisole over area of fibrosis     Soft tissue mobilization  around breast incision    Manual Lymphatic Drainage (MLD)  short neck, superficial and deep abdomen, left inguinal nodes and axillo-inguinal anastomosis, then left breast directing towards pathway; reinforced axillo-inguinal pathway.                  PT Long Term Goals - 09/29/18 1022      PT LONG TERM GOAL #1   Title  Patient will demonstrate she has returned to bsaeline related to shoulder ROM and function.    Time  8    Period  Weeks    Status  Achieved      PT LONG TERM GOAL #2   Title  Patient will report >/= 50% improvement in left breast  hardness and feeling of heaviness.    Baseline  60% improvement-09/11/18    Time  4    Period  Weeks    Status  Achieved      PT LONG TERM GOAL #3   Title  Improve DASH score to be </= 8 for improved overall shoulder function.    Baseline  18.18, 09/29/18- 6.82    Time  4    Period  Weeks    Status  Achieved      PT LONG TERM GOAL #4   Title  Patient will verbalize good understanding of lymphedema / edema management including self manual lymph drainage.    Time  4    Period  Weeks    Status  Achieved      PT LONG TERM GOAL #5   Title  Pt will report a 75% improvement in hardness of scar tissue under left lumpectomy scar     Time  4    Period  Weeks    Status  New    Target Date  10/27/18            Plan - 11/11/18 1215    Clinical Impression Statement  Continued MLD to L breast. Pt reports she is unable to wear her compression bra at this time due to increased discomfort. She has been wearing a camisole with a shelf bra. Made pt a small chip pack to wear in her shelf bra for additional compression over area of fibrosis.     Rehab Potential  Good    PT Frequency  1x / week    PT Duration  4 weeks    PT Treatment/Interventions  ADLs/Self Care Home Management;Therapeutic exercise;Patient/family education;Manual techniques;Passive range of motion;Manual lymph drainage;DME Instruction    PT Next Visit Plan  continue MLD to left breast and scar mobilization to left lumpectomy scar, assess for increase in swelling while pt goes through radiation    PT Home Exercise Plan  Post op shoulder ROM HEP    Consulted and Agree with Plan of Care  Patient       Patient will benefit from skilled therapeutic intervention in order to improve the following deficits and impairments:  Decreased knowledge of precautions, Impaired UE functional use, Decreased range of motion, Postural dysfunction, Pain, Increased edema  Visit Diagnosis: Localized edema     Problem List Patient Active  Problem List   Diagnosis Date Noted  . Adjustment disorder with anxiety 11/06/2018  . Ductal carcinoma in situ (DCIS) of right breast 10/01/2018  . Genetic testing 07/22/2018  . Family history of breast cancer   . Family history of colon cancer in father   .  Malignant neoplasm of upper-outer quadrant of left breast in female, estrogen receptor positive ([place]) 06/24/2018    Allyson Sabal [facility] 11/11/2018, 12:16 PM  [place], [place], [address] Phone: [phone]   Fax:  [phone]  Name: Judith Mcneil MRN: [id number] Date of Birth: Feb 15, 1956  Manus Gunning, PT 11/11/18 12:16 PM

## 2018-11-12 ENCOUNTER — Ambulatory Visit: Payer: BLUE CROSS/BLUE SHIELD

## 2018-11-12 ENCOUNTER — Ambulatory Visit
Admission: RE | Admit: 2018-11-12 | Discharge: 2018-11-12 | Disposition: A | Discharge status: Home / Self Care | Payer: BLUE CROSS/BLUE SHIELD | Source: Ambulatory Visit | Attending: Radiation Oncology | Admitting: Radiation Oncology

## 2018-11-12 DIAGNOSIS — C50412 Malignant neoplasm of upper-outer quadrant of left female breast: Secondary | ICD-10-CM | POA: Diagnosis not present

## 2018-11-13 ENCOUNTER — Ambulatory Visit: Payer: BLUE CROSS/BLUE SHIELD

## 2018-11-13 ENCOUNTER — Ambulatory Visit
Admission: RE | Admit: 2018-11-13 | Discharge: 2018-11-13 | Disposition: A | Discharge status: Home / Self Care | Payer: BLUE CROSS/BLUE SHIELD | Source: Ambulatory Visit | Attending: Radiation Oncology | Admitting: Radiation Oncology

## 2018-11-13 DIAGNOSIS — C50412 Malignant neoplasm of upper-outer quadrant of left female breast: Secondary | ICD-10-CM | POA: Diagnosis not present

## 2018-11-14 ENCOUNTER — Ambulatory Visit: Payer: BLUE CROSS/BLUE SHIELD

## 2018-11-14 ENCOUNTER — Ambulatory Visit
Admission: RE | Admit: 2018-11-14 | Discharge: 2018-11-14 | Disposition: A | Discharge status: Home / Self Care | Payer: BLUE CROSS/BLUE SHIELD | Source: Ambulatory Visit | Attending: Radiation Oncology | Admitting: Radiation Oncology

## 2018-11-14 DIAGNOSIS — C50412 Malignant neoplasm of upper-outer quadrant of left female breast: Secondary | ICD-10-CM | POA: Diagnosis not present

## 2018-11-15 NOTE — Progress Notes (Signed)
  Radiation Oncology         [phone] ________________________________  Name: Judith Mcneil MRN: [id number]  Date: 10/08/2018  DOB: 07-02-1956   DIAGNOSIS:     ICD-10-CM   1. Malignant neoplasm of upper-outer quadrant of left breast in female, estrogen receptor positive ([place]) C50.412    Z17.0   2. Ductal carcinoma in situ (DCIS) of right breast D05.11     SIMULATION AND TREATMENT PLANNING NOTE  The patient presented for simulation prior to beginning her course of radiation treatment for her diagnosis of bilateral-sided breast cancer. The patient was placed in a supine position on a breast board. A customized vac-lock bag was constructed and this complex treatment device will be used on a daily basis during her treatment. In this fashion, a CT scan was obtained through the chest area and an isocenter was placed near the chest wall within the breast.  The patient will be planned to receive a course of radiation initially to a dose of 42.56 Gy. This will consist of a whole breast radiotherapy technique. To accomplish this, 4 customized blocks have been designed which will correspond to medial and lateral whole breast tangent fields, 2 for each side. This treatment will be accomplished at 2.66 Gy per fraction. A forward planning technique will also be evaluated to determine if this approach improves the plan. It is anticipated that the patient will then receive a 8 Gy boost to the seroma cavity which has been contoured. This will be accomplished at 2 Gy per fraction.   This initial treatment will consist of a 3-D conformal technique. The seroma has been contoured as the primary target structure. Additionally, dose volume histograms of both this target as well as the lungs and heart will also be evaluated. Such an approach is necessary to ensure that the target area is adequately covered while the nearby critical  normal structures are adequately spared.  Plan:  The final anticipated total  dose therefore will correspond to 50.56 Gy.    _______________________________   Jodelle Gross, MD, PhD

## 2018-11-15 NOTE — Progress Notes (Signed)
  Radiation Oncology         [phone] ________________________________  Name: Judith Mcneil MRN: [id number]  Date: 10/08/2018  DOB: December 10, 1956  Optical Surface Tracking Plan:  Since intensity modulated radiotherapy (IMRT) and 3D conformal radiation treatment methods are predicated on accurate and precise positioning for treatment, intrafraction motion monitoring is medically necessary to ensure accurate and safe treatment delivery.  The ability to quantify intrafraction motion without excessive ionizing radiation dose can only be performed with optical surface tracking. Accordingly, surface imaging offers the opportunity to obtain 3D measurements of patient position throughout IMRT and 3D treatments without excessive radiation exposure.  I am ordering optical surface tracking for this patient's upcoming course of radiotherapy. ________________________________  Kyung Rudd, MD 11/15/2018 6:00 PM    Reference:   Ursula Alert, J, et al. Surface imaging-based analysis of intrafraction motion for breast radiotherapy patients.Journal of [place], n. 6, nov. 2014. ISSN [id number].   Available at: <http://www.jacmp.org/index.php/jacmp/article/view/4957>.

## 2018-11-17 ENCOUNTER — Encounter: Payer: Self-pay | Admitting: Physical Therapy

## 2018-11-17 ENCOUNTER — Telehealth: Payer: Self-pay | Admitting: Hematology and Oncology

## 2018-11-17 ENCOUNTER — Other Ambulatory Visit: Payer: Self-pay

## 2018-11-17 ENCOUNTER — Ambulatory Visit
Admission: RE | Admit: 2018-11-17 | Discharge: 2018-11-17 | Disposition: A | Discharge status: Home / Self Care | Payer: BLUE CROSS/BLUE SHIELD | Source: Ambulatory Visit | Attending: Radiation Oncology | Admitting: Radiation Oncology

## 2018-11-17 ENCOUNTER — Ambulatory Visit: Payer: BLUE CROSS/BLUE SHIELD | Admitting: Physical Therapy

## 2018-11-17 ENCOUNTER — Ambulatory Visit: Payer: BLUE CROSS/BLUE SHIELD

## 2018-11-17 ENCOUNTER — Inpatient Hospital Stay: Payer: BLUE CROSS/BLUE SHIELD | Attending: Hematology and Oncology | Admitting: Hematology and Oncology

## 2018-11-17 DIAGNOSIS — C50412 Malignant neoplasm of upper-outer quadrant of left female breast: Secondary | ICD-10-CM | POA: Insufficient documentation

## 2018-11-17 DIAGNOSIS — Z79899 Other long term (current) drug therapy: Secondary | ICD-10-CM | POA: Diagnosis not present

## 2018-11-17 DIAGNOSIS — Z923 Personal history of irradiation: Secondary | ICD-10-CM

## 2018-11-17 DIAGNOSIS — R600 Localized edema: Secondary | ICD-10-CM | POA: Diagnosis not present

## 2018-11-17 DIAGNOSIS — Z170 Estrogen receptor positive status [ER+]: Secondary | ICD-10-CM | POA: Diagnosis not present

## 2018-11-17 DIAGNOSIS — Z79811 Long term (current) use of aromatase inhibitors: Secondary | ICD-10-CM | POA: Diagnosis not present

## 2018-11-17 MED ORDER — LETROZOLE 2.5 MG PO TABS: 2.5000 mg | ORAL_TABLET | Freq: Every day | ORAL | 3 refills | Status: DC

## 2018-11-17 NOTE — Therapy (Signed)
[place], [place], [address] Phone: [phone]   Fax:  [phone]  Physical Therapy Treatment  Patient Details  Name: Judith Mcneil MRN: [id number] Date of Birth: 01-20-1956 Referring Provider (PT): Dr. Excell Seltzer   Encounter Date: 11/17/2018  PT End of Session - 11/17/18 1210    Visit Number  11    Number of Visits  14    Date for PT Re-Evaluation  11/23/18    PT Start Time  0933    PT Stop Time  1016    PT Time Calculation (min)  43 min    Activity Tolerance  Patient tolerated treatment well    Behavior During Therapy  [facility] for tasks assessed/performed       Past Medical History:  Diagnosis Date  . Anxiety   . Family history of breast cancer   . Family history of colon cancer in father   . Medical history non-contributory   . PONV (postoperative nausea and vomiting)     Past Surgical History:  Procedure Laterality Date  . BREAST LUMPECTOMY WITH RADIOACTIVE SEED AND SENTINEL LYMPH NODE BIOPSY Left 08/01/2018   Procedure: LEFT BREAST LUMPECTOMY WITH RADIOACTIVE SEED AND SENTINEL LYMPH NODE BIOPSY;  Surgeon: Excell Seltzer, MD;  Location: [place];  Service: General;  Laterality: Left;  . BREAST LUMPECTOMY WITH RADIOACTIVE SEED LOCALIZATION Right 08/01/2018   Procedure: RIGHT BREAST LUMPECTOMY X 2  WITH RADIOACTIVE SEED RIGHT LOCALIZATION X 2;  Surgeon: Excell Seltzer, MD;  Location: [place];  Service: General;  Laterality: Right;  . COLONOSCOPY     x 2  . DILATATION & CURETTAGE/HYSTEROSCOPY WITH TRUECLEAR N/A 08/14/2013   Procedure: DILATATION & CURETTAGE/HYSTEROSCOPY WITH TRUECLEAR;  Surgeon: Logan Bores, MD;  Location: [facility];  Service: Gynecology;  Laterality: N/A;  1 hr in the OR  . RE-EXCISION OF BREAST LUMPECTOMY Right 08/20/2018   Procedure: RE-EXCISION OF BREAST LUMPECTOMY;  Surgeon: Excell Seltzer, MD;  Location: [place];  Service: General;  Laterality: Right;  . RE-EXCISION OF BREAST LUMPECTOMY Right 09/16/2018   Procedure: RE-EXCISION OF BREAST LUMPECTOMY;  Surgeon: Excell Seltzer, MD;  Location: [facility];  Service: General;  Laterality: Right;  . TONSILLECTOMY     age 62    There were no vitals filed for this visit.  Subjective Assessment - 11/17/18 0934    Subjective  I have today and tomorrow and then I am done with radiation. I think the chip pack helped.     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 She had a re-excision on the right breast on 08/20/18. It is ER/PR positive and HER2 negative with a Ki67 of 3%.     Patient Stated Goals  To get decreased swelling in her left breast    Currently in Pain?  No/denies    Pain Score  0-No pain                       [facility] Adult PT Treatment/Exercise - 11/17/18 0001      Manual Therapy   Soft tissue mobilization  around breast incision    Manual Lymphatic Drainage (MLD)  short neck, superficial and deep abdomen, left inguinal nodes and axillo-inguinal anastomosis, then left breast directing towards pathway;  reinforced axillo-inguinal pathway.                  PT Long Term Goals - 09/29/18 1022      PT LONG TERM GOAL #1   Title  Patient will demonstrate she has returned to bsaeline related to shoulder ROM and function.    Time  8    Period  Weeks    Status  Achieved      PT LONG TERM GOAL #2   Title  Patient will report >/= 50% improvement in left breast hardness and feeling of heaviness.    Baseline  60% improvement-09/11/18    Time  4    Period  Weeks    Status  Achieved      PT LONG TERM GOAL #3   Title  Improve DASH score to be </= 8 for improved overall shoulder function.    Baseline   18.18, 09/29/18- 6.82    Time  4    Period  Weeks    Status  Achieved      PT LONG TERM GOAL #4   Title  Patient will verbalize good understanding of lymphedema / edema management including self manual lymph drainage.    Time  4    Period  Weeks    Status  Achieved      PT LONG TERM GOAL #5   Title  Pt will report a 75% improvement in hardness of scar tissue under left lumpectomy scar     Time  4    Period  Weeks    Status  New    Target Date  10/27/18            Plan - 11/17/18 1210    Clinical Impression Statement  Continued MLD to L breast. She has been wearing the chip pack over area of fibrosis in her left breast and today there was noticeably less fibrosis around left lumpectomy scar. She will be completing radiation this week.     Rehab Potential  Good    Clinical Impairments Affecting Rehab Potential  pt beginning radiation    PT Duration  4 weeks    PT Treatment/Interventions  ADLs/Self Care Home Management;Therapeutic exercise;Patient/family education;Manual techniques;Passive range of motion;Manual lymph drainage;DME Instruction    PT Next Visit Plan  continue MLD to left breast and scar mobilization to left lumpectomy scar, assess for increase in swelling while pt goes through radiation    PT Home Exercise Plan  Post op shoulder ROM HEP    Consulted and Agree with Plan of Care  Patient       Patient will benefit from skilled therapeutic intervention in order to improve the following deficits and impairments:  Decreased knowledge of precautions, Impaired UE functional use, Decreased range of motion, Postural dysfunction, Pain, Increased edema  Visit Diagnosis: Localized edema     Problem List Patient Active Problem List   Diagnosis Date Noted  . Adjustment disorder with anxiety 11/06/2018  . Ductal carcinoma in situ (DCIS) of right breast 10/01/2018  . Genetic testing 07/22/2018  . Family history of breast cancer   . Family history of colon cancer in  father   . Malignant neoplasm of upper-outer quadrant of left breast in female, estrogen receptor positive ([place]) 06/24/2018    Allyson Sabal [facility] 11/17/2018, 12:12 PM  [place], [place], [address] Phone: [phone]   Fax:  [phone]  Name: Judith Mcneil MRN: [id number] Date  of Birth: Aug 24, 1956  Allyson Sabal [place], PT 11/17/18 12:12 PM

## 2018-11-17 NOTE — Assessment & Plan Note (Signed)
08/01/2018:Left lumpectomy: IDC grade 1, 1.2 cm, margins negative, 0/1 lymph node negative ER 94%, PR 90%, HER-2 negative ratio 1.09, Ki-67 3%, T1CN0 stage Ia;  Right lumpectomy: DCIS high-grade 1.4 cm, broadly less than 0.1 cm to posterior margin, ER 100%, PR 100%, Tis NX stage 0 Oncotype DX score 19: Low risk 6% distant recurrence risk at 9 years  Adjuvant radiation therapy 10/21/2018 to 11/17/2018  Treatment plan: Adjuvant antiestrogen therapy with letrozole 2.5 mg daily x7 years to start 12/07/2018  Letrozole counseling: We discussed the risks and benefits of anti-estrogen therapy with aromatase inhibitors. These include but not limited to insomnia, hot flashes, mood changes, vaginal dryness, bone density loss, and weight gain. We strongly believe that the benefits far outweigh the risks. Patient understands these risks and consented to starting treatment. Planned treatment duration is 7 years.  Return to clinic in 3 months for survivorship care plan visit

## 2018-11-17 NOTE — Telephone Encounter (Signed)
Gave avs and calendar ° °

## 2018-11-17 NOTE — Progress Notes (Signed)
Patient Care Team: Vernie Shanks, MD as PCP - General (Family Medicine) Excell Seltzer, MD as Consulting Physician (General Surgery) Nicholas Lose, MD as Consulting Physician (Hematology and Oncology) Kyung Rudd, MD as Consulting Physician (Radiation Oncology)  DIAGNOSIS:  Encounter Diagnosis  Name Primary?  . Malignant neoplasm of upper-outer quadrant of left breast in female, estrogen receptor positive ([place])     SUMMARY OF ONCOLOGIC HISTORY:   Malignant neoplasm of upper-outer quadrant of left breast in female, estrogen receptor positive ([place])   06/19/2018 Initial Diagnosis    Screening detected left breast mass and distortion on further evaluation of mass went away and the distortion did not have a sonographic correlate.  Stereotactic biopsy revealed grade 2 IDC ER 95%, PR 90%, Ki-67 3%, HER-2 negative ratio 1.09 along with LCIS TX N0 stage Ia clinical stage     Genetic Testing    Negative genetic testing on the [facility] Breast Cancer STAT panel and Common Hereditary Cancers Panel. The STAT Breast cancer panel offered by [facility] includes sequencing and rearrangement analysis for the following 9 genes:  ATM, BRCA1, BRCA2, CDH1, CHEK2, PALB2, PTEN, STK11 and TP53.  The Common Hereditary Cancer Panel offered by [facility] includes sequencing and/or deletion duplication testing of the following 47 genes: APC, ATM, AXIN2, BARD1, BMPR1A, BRCA1, BRCA2, BRIP1, CDH1, CDKN2A (p14ARF), CDKN2A (p16INK4a), CKD4, CHEK2, CTNNA1, DICER1, EPCAM (Deletion/duplication testing only), GREM1 (promoter region deletion/duplication testing only), KIT, MEN1, MLH1, MSH2, MSH3, MSH6, MUTYH, NBN, NF1, NHTL1, PALB2, PDGFRA, PMS2, POLD1, POLE, PTEN, RAD50, RAD51C, RAD51D, SDHB, SDHC, SDHD, SMAD4, SMARCA4. STK11, TP53, TSC1, TSC2, and VHL.  The following genes were evaluated for sequence changes only: SDHA and HOXB13 c.251G>A variant only.  The report date is 07/21/2018.    08/01/2018 Surgery    Left lumpectomy: IDC  grade 1, 1.2 cm, margins negative, 0/1 lymph node negative ER 94%, PR 90%, HER-2 negative ratio 1.09, Ki-67 3%, T1CN0 stage Ia; Right lumpectomy: DCIS high-grade 1.4 cm, broadly less than 0.1 cm to posterior margin, ER 100%, PR 100%, Tis NX stage 0    08/08/2018 Oncotype testing    Oncotype DX score 19: Low risk 6% distant recurrence risk at 9 years    08/27/2018 Cancer Staging    Staging form: Breast, AJCC 8th Edition - Pathologic: Stage IA (pT1c, pN0, cM0, G1, ER+, PR+, HER2-, Oncotype DX score: 19) - Signed by Gardenia Phlegm, NP on 08/27/2018    10/21/2018 - 11/17/2018 Radiation Therapy    Adjuvant radiation therapy     CHIEF COMPLIANT: Follow-up after completion of radiation  INTERVAL HISTORY: Judith Mcneil is a 62 year old with above-mentioned history of bilateral lumpectomies who finishes radiation therapy tomorrow.  She is here today to discuss antiestrogen treatment plan.  REVIEW OF SYSTEMS:   Constitutional: Denies fevers, chills or abnormal weight loss Eyes: Denies blurriness of vision Ears, nose, mouth, throat, and face: Denies mucositis or sore throat Respiratory: Denies cough, dyspnea or wheezes Cardiovascular: Denies palpitation, chest discomfort Gastrointestinal:  Denies nausea, heartburn or change in bowel habits Skin: Denies abnormal skin rashes Lymphatics: Denies new lymphadenopathy or easy bruising Neurological:Denies numbness, tingling or new weaknesses Behavioral/Psych: Mood is stable, no new changes  Extremities: No lower extremity edema Breast:  denies any pain or lumps or nodules in either breasts All other systems were reviewed with the patient and are negative.  I have reviewed the past medical history, past surgical history, social history and family history with the patient and they are unchanged from previous note.  ALLERGIES:  has No Known Allergies.  MEDICATIONS:  Current Outpatient Medications  Medication Sig Dispense Refill  .  ALPRAZolam (XANAX) 0.25 MG tablet Take 0.25 mg by mouth at bedtime as needed for anxiety.    Marland Kitchen buPROPion (WELLBUTRIN XL) 150 MG 24 hr tablet Take 150 mg by mouth daily. 1/d x 1 wk, stop    . FLUoxetine (PROZAC) 10 MG capsule Take 10 mg by mouth daily.    Marland Kitchen ibuprofen (ADVIL,MOTRIN) 200 MG tablet Take 400 mg by mouth every 6 (six) hours as needed.    . loratadine (CLARITIN) 10 MG tablet Take 10 mg by mouth daily.    Marland Kitchen oxyCODONE (OXY IR/ROXICODONE) 5 MG immediate release tablet Take 1 tablet (5 mg total) by mouth every 6 (six) hours as needed for moderate pain, severe pain or breakthrough pain. 12 tablet 0  . valACYclovir (VALTREX) 1000 MG tablet Take 1,000 mg by mouth daily as needed (outbreak).     No current facility-administered medications for this visit.     PHYSICAL EXAMINATION: ECOG PERFORMANCE STATUS: 1 - Symptomatic but completely ambulatory  Vitals:   11/17/18 1526  BP: 134/77  Pulse: (!) 102  Resp: 17  Temp: 97.6 F (36.4 C)  SpO2: 98%   Filed Weights   11/17/18 1526  Weight: 179 lb 4.8 oz (81.3 kg)    GENERAL:alert, no distress and comfortable SKIN: skin color, texture, turgor are normal, no rashes or significant lesions EYES: normal, Conjunctiva are pink and non-injected, sclera clear OROPHARYNX:no exudate, no erythema and lips, buccal mucosa, and tongue normal  NECK: supple, thyroid normal size, non-tender, without nodularity LYMPH:  no palpable lymphadenopathy in the cervical, axillary or inguinal LUNGS: clear to auscultation and percussion with normal breathing effort HEART: regular rate & rhythm and no murmurs and no lower extremity edema ABDOMEN:abdomen soft, non-tender and normal bowel sounds MUSCULOSKELETAL:no cyanosis of digits and no clubbing  NEURO: alert & oriented x 3 with fluent speech, no focal motor/sensory deficits EXTREMITIES: No lower extremity edema   LABORATORY DATA:  I have reviewed the data as listed CMP Latest Ref Rng & Units 06/25/2018    Glucose 70 - 99 mg/dL 94  BUN 8 - 23 mg/dL 20  Creatinine 0.44 - 1.00 mg/dL 0.95  Sodium 135 - 145 mmol/L 139  Potassium 3.5 - 5.1 mmol/L 4.4  Chloride 98 - 111 mmol/L 108  CO2 22 - 32 mmol/L 26  Calcium 8.9 - 10.3 mg/dL 9.2  Total Protein 6.5 - 8.1 g/dL 7.0  Total Bilirubin 0.3 - 1.2 mg/dL 0.5  Alkaline Phos 38 - 126 U/L 90  AST 15 - 41 U/L 19  ALT 0 - 44 U/L 25    Lab Results  Component Value Date   WBC 6.9 06/25/2018   HGB 13.9 06/25/2018   HCT 41.7 06/25/2018   MCV 92.8 06/25/2018   PLT 262 06/25/2018   NEUTROABS 4.4 06/25/2018    ASSESSMENT & PLAN:  Malignant neoplasm of upper-outer quadrant of left breast in female, estrogen receptor positive ([facility]) 08/01/2018:Left lumpectomy: IDC grade 1, 1.2 cm, margins negative, 0/1 lymph node negative ER 94%, PR 90%, HER-2 negative ratio 1.09, Ki-67 3%, T1CN0 stage Ia;  Right lumpectomy: DCIS high-grade 1.4 cm, broadly less than 0.1 cm to posterior margin, ER 100%, PR 100%, Tis NX stage 0 Oncotype DX score 19: Low risk 6% distant recurrence risk at 9 years  Adjuvant radiation therapy 10/21/2018 to 11/17/2018  Treatment plan: Adjuvant antiestrogen therapy with letrozole 2.5 mg daily  x7 years to start 12/07/2018  Letrozole counseling: We discussed the risks and benefits of anti-estrogen therapy with aromatase inhibitors. These include but not limited to insomnia, hot flashes, mood changes, vaginal dryness, bone density loss, and weight gain. We strongly believe that the benefits far outweigh the risks. Patient understands these risks and consented to starting treatment. Planned treatment duration is 7 years.  Return to clinic in 3 months for survivorship care plan visit      No orders of the defined types were placed in this encounter.  The patient has a good understanding of the overall plan. she agrees with it. she will call with any problems that may develop before the next visit here.   Harriette Ohara, MD 11/17/18

## 2018-11-18 ENCOUNTER — Ambulatory Visit
Admission: RE | Admit: 2018-11-18 | Discharge: 2018-11-18 | Disposition: A | Discharge status: Home / Self Care | Payer: BLUE CROSS/BLUE SHIELD | Source: Ambulatory Visit | Attending: Radiation Oncology | Admitting: Radiation Oncology

## 2018-11-18 DIAGNOSIS — C50412 Malignant neoplasm of upper-outer quadrant of left female breast: Secondary | ICD-10-CM | POA: Diagnosis not present

## 2018-11-24 ENCOUNTER — Ambulatory Visit: Payer: BLUE CROSS/BLUE SHIELD | Admitting: Psychiatry

## 2018-11-24 DIAGNOSIS — F4322 Adjustment disorder with anxiety: Secondary | ICD-10-CM

## 2018-11-24 DIAGNOSIS — F411 Generalized anxiety disorder: Secondary | ICD-10-CM | POA: Diagnosis not present

## 2018-11-24 MED ORDER — FLUOXETINE HCL 10 MG PO CAPS: 10.0000 mg | ORAL_CAPSULE | Freq: Every day | ORAL | 1 refills | Status: DC

## 2018-11-24 MED ORDER — ALPRAZOLAM 0.25 MG PO TABS: 0.2500 mg | ORAL_TABLET | Freq: Every evening | ORAL | 1 refills | Status: DC | PRN

## 2018-11-24 NOTE — Progress Notes (Signed)
[facility]  Patient ID: NALAYA WOJDYLA,  MRN: [id number]  PCP: Vernie Shanks, MD  Date of Evaluation: 11/24/2018 Time spent:20 minutes  Chief Complaint:   HISTORY/CURRENT STATUS: HPI very pleasant 62 year old white female last seen 6/19.  She was doing well at the time.  No changes in medications. She continues to do well. Finishing up therapy for breast cancer.  Individual Medical History/ Review of Systems: Changes? :No   Allergies: Patient has no known allergies.  Current Medications:  Current Outpatient Medications:  .  ALPRAZolam (XANAX) 0.25 MG tablet, Take 0.25 mg by mouth at bedtime as needed for anxiety., Disp: , Rfl:  .  FLUoxetine (PROZAC) 10 MG capsule, Take 10 mg by mouth daily., Disp: , Rfl:  .  buPROPion (WELLBUTRIN XL) 150 MG 24 hr tablet, Take 150 mg by mouth daily. 1/d x 1 wk, stop, Disp: , Rfl:  .  ibuprofen (ADVIL,MOTRIN) 200 MG tablet, Take 400 mg by mouth every 6 (six) hours as needed., Disp: , Rfl:  .  letrozole (FEMARA) 2.5 MG tablet, Take 1 tablet (2.5 mg total) by mouth daily. (Patient not taking: Reported on 11/24/2018), Disp: 90 tablet, Rfl: 3 .  loratadine (CLARITIN) 10 MG tablet, Take 10 mg by mouth daily., Disp: , Rfl:  .  oxyCODONE (OXY IR/ROXICODONE) 5 MG immediate release tablet, Take 1 tablet (5 mg total) by mouth every 6 (six) hours as needed for moderate pain, severe pain or breakthrough pain. (Patient not taking: Reported on 11/24/2018), Disp: 12 tablet, Rfl: 0 .  valACYclovir (VALTREX) 1000 MG tablet, Take 1,000 mg by mouth daily as needed (outbreak)., Disp: , Rfl:  Medication Side Effects: none  Family Medical/ Social History: Changes? No  MENTAL HEALTH EXAM:  There were no vitals taken for this visit.There is no height or weight on file to calculate BMI.  General Appearance: Casual  Eye Contact:  Good  Speech:  Normal Rate  Volume:  Normal  Mood:  Euthymic  Affect:  Appropriate  Thought Process:  Linear  Orientation:   Full (Time, Place, and Person)  Thought Content: Logical   Suicidal Thoughts:  No  Homicidal Thoughts:  No  Memory:  WNL  Judgement:  Good  Insight:  Good  Psychomotor Activity:  Normal  Concentration:  Concentration: Good  Recall:  Good  Fund of Knowledge: Good  Language: Good  Assets:  Desire for Improvement  ADL's:  Intact  Cognition: WNL  Prognosis:  Good    DIAGNOSES:    ICD-10-CM   1. Adjustment disorder with anxiety F43.22   2. Anxiety state F41.1     Receiving Psychotherapy: No    RECOMMENDATIONS: Patient continues to do well.  To continue Prozac 10 mg a day.  Xanax 0.25 mg at at bedtime.  I will see her again in 6 months   Comer Locket, [place]

## 2018-11-25 ENCOUNTER — Ambulatory Visit: Payer: BLUE CROSS/BLUE SHIELD | Attending: General Surgery

## 2018-11-25 DIAGNOSIS — C50412 Malignant neoplasm of upper-outer quadrant of left female breast: Secondary | ICD-10-CM | POA: Diagnosis present

## 2018-11-25 DIAGNOSIS — Z170 Estrogen receptor positive status [ER+]: Secondary | ICD-10-CM | POA: Diagnosis present

## 2018-11-25 DIAGNOSIS — R600 Localized edema: Secondary | ICD-10-CM

## 2018-11-25 DIAGNOSIS — R293 Abnormal posture: Secondary | ICD-10-CM

## 2018-11-25 DIAGNOSIS — Z483 Aftercare following surgery for neoplasm: Secondary | ICD-10-CM | POA: Diagnosis present

## 2018-11-25 NOTE — Therapy (Signed)
[place], [place], [address] Phone: [phone]   Fax:  [phone]  Physical Therapy Treatment  Patient Details  Name: Judith Mcneil MRN: [id number] Date of Birth: 02/25/1956 Referring Provider (PT): Dr. Excell Seltzer   Encounter Date: 11/25/2018  PT End of Session - 11/25/18 0934    Visit Number  12    Number of Visits  18    Date for PT Re-Evaluation  12/23/18    PT Start Time  0850    PT Stop Time  0934    PT Time Calculation (min)  44 min    Activity Tolerance  Patient tolerated treatment well    Behavior During Therapy  [facility] for tasks assessed/performed       Past Medical History:  Diagnosis Date  . Anxiety   . Family history of breast cancer   . Family history of colon cancer in father   . Medical history non-contributory   . PONV (postoperative nausea and vomiting)     Past Surgical History:  Procedure Laterality Date  . BREAST LUMPECTOMY WITH RADIOACTIVE SEED AND SENTINEL LYMPH NODE BIOPSY Left 08/01/2018   Procedure: LEFT BREAST LUMPECTOMY WITH RADIOACTIVE SEED AND SENTINEL LYMPH NODE BIOPSY;  Surgeon: Excell Seltzer, MD;  Location: [facility];  Service: General;  Laterality: Left;  . BREAST LUMPECTOMY WITH RADIOACTIVE SEED LOCALIZATION Right 08/01/2018   Procedure: RIGHT BREAST LUMPECTOMY X 2  WITH RADIOACTIVE SEED RIGHT LOCALIZATION X 2;  Surgeon: Excell Seltzer, MD;  Location: [facility];  Service: General;  Laterality: Right;  . COLONOSCOPY     x 2  . DILATATION & CURETTAGE/HYSTEROSCOPY WITH TRUECLEAR N/A 08/14/2013   Procedure: DILATATION & CURETTAGE/HYSTEROSCOPY WITH TRUECLEAR;  Surgeon: Logan Bores, MD;  Location: [facility];  Service: Gynecology;  Laterality: N/A;  1 hr in the OR  . RE-EXCISION OF BREAST LUMPECTOMY Right 08/20/2018   Procedure: RE-EXCISION OF BREAST LUMPECTOMY;  Surgeon: Excell Seltzer, MD;  Location: [facility];  Service: General;  Laterality: Right;  . RE-EXCISION OF BREAST LUMPECTOMY Right 09/16/2018   Procedure: RE-EXCISION OF BREAST LUMPECTOMY;  Surgeon: Excell Seltzer, MD;  Location: [facility];  Service: General;  Laterality: Right;  . TONSILLECTOMY     age 62    There were no vitals filed for this visit.  Subjective Assessment - 11/25/18 0852    Subjective  My skin did good through radiation and I finished Tuesday. I started wearing my compression bra bc I started noticing a firm lump of fluid at the side of my breast.     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 She had a re-excision on the right breast on 08/20/18. It is ER/PR positive and HER2 negative with a Ki67 of 3%.     Patient Stated Goals  To get decreased swelling in her left breast    Currently in Pain?  No/denies                       [facility] Adult PT Treatment/Exercise - 11/25/18 0001      Manual Therapy   Soft tissue mobilization  around breast incision    Manual Lymphatic Drainage (MLD)  short neck, superficial and deep abdomen,  left inguinal nodes and axillo-inguinal anastomosis,  then left breast directing towards pathway; reinforced axillo-inguinal pathway.                  PT Long Term Goals - 11/25/18 0934      PT LONG TERM GOAL #2   Title  Patient will report >/= 50% improvement in left breast hardness and feeling of heaviness.    Baseline  60% improvement-09/11/18; 75% improvement at this time-11/25/18    Status  Achieved      PT LONG TERM GOAL #3   Title  Improve DASH score to be </= 8 for improved overall shoulder function.    Baseline  18.18, 09/29/18- 6.82    Status  Achieved      PT LONG TERM GOAL #4   Title  Patient will verbalize good understanding of  lymphedema / edema management including self manual lymph drainage.    Status  Achieved      PT LONG TERM GOAL #5   Title  Pt will report a 75% improvement in hardness of scar tissue under left lumpectomy scar     Baseline  50% improvement since finishing radiation-11/25/18    Status  On-going            Plan - 11/25/18 0937    Clinical Impression Statement  Continued with MLD to Lt breast. Small increase area of swelling palpable at Lt lateral breast but htis did improve during MLD today and pt reported noticing this as well. Also focused on scar tissue at areolar incision. Renewal done today as pt requests to come at least a few more visits to further decrease of new swelling since radiation ended last week.     Rehab Potential  Good    Clinical Impairments Affecting Rehab Potential  pt beginning radiation    PT Frequency  1x / week    PT Duration  4 weeks    PT Treatment/Interventions  ADLs/Self Care Home Management;Therapeutic exercise;Patient/family education;Manual techniques;Passive range of motion;Manual lymph drainage;DME Instruction    PT Next Visit Plan  Renewal done today. continue MLD to left breast and scar mobilization to left lumpectomy scar, assess for increase in swelling since ending radiation    Consulted and Agree with Plan of Care  Patient       Patient will benefit from skilled therapeutic intervention in order to improve the following deficits and impairments:  Decreased knowledge of precautions, Impaired UE functional use, Decreased range of motion, Postural dysfunction, Pain, Increased edema  Visit Diagnosis: Localized edema  Aftercare following surgery for neoplasm  Abnormal posture  Malignant neoplasm of upper-outer quadrant of left breast in female, estrogen receptor positive ([place])     Problem List Patient Active Problem List   Diagnosis Date Noted  . Adjustment disorder with anxiety 11/06/2018  . Ductal carcinoma in situ (DCIS) of right breast  10/01/2018  . Genetic testing 07/22/2018  . Family history of breast cancer   . Family history of colon cancer in father   . Malignant neoplasm of upper-outer quadrant of left breast in female, estrogen receptor positive ([place]) 06/24/2018    Otelia Limes, PTA 11/25/2018, 10:11 AM  [place], [place], [address] Phone: [phone]   Fax:  [phone]  Name: Judith Mcneil MRN: [id number] Date of Birth: Nov 17, 1956

## 2018-11-26 ENCOUNTER — Other Ambulatory Visit: Payer: Self-pay

## 2018-11-26 DIAGNOSIS — E2839 Other primary ovarian failure: Secondary | ICD-10-CM

## 2018-11-26 NOTE — Progress Notes (Signed)
Pt calling to see if she can have her bone density done this year prior to starting her anti estrogen. Dr.Gudena wanted pt to get this done soon, since she will be starting on hormonal therapy. Placed order per MD approval and told pt that she may call [facility] to schedule her appt for the bone density. No further needs at this time.

## 2018-11-27 ENCOUNTER — Encounter: Payer: Self-pay | Admitting: *Deleted

## 2018-11-28 ENCOUNTER — Encounter: Payer: Self-pay | Admitting: Radiation Oncology

## 2018-11-28 NOTE — Progress Notes (Signed)
  Radiation Oncology         [phone] ________________________________  Name: Judith Mcneil MRN: [id number]  Date: 11/28/2018  DOB: July 16, 1956  End of Treatment Note  Diagnosis:   Bilateral breast cancer     Indication for treatment:  Curative       Radiation treatment dates:   10/21/18 - 11/18/18  Site/dose:   The patient initially received a dose of 42.56 Gy in 16 fractions to each breast using whole-breast tangent fields. This was delivered using a 3-D conformal technique. The patient then received a boost to each seroma. This delivered an additional 8 Gy in 4 fractions using a 3 field photon technique due to the depth of the seroma. The total dose was 50 Gy.  Narrative: The patient tolerated radiation treatment relatively well.   The patient had some expected skin irritation as she progressed during treatment. Desquamation was not present at the end of treatment. She reported moderate fatigue, hyperpigmentation, and moderate erythema with itching.  Plan: The patient has completed radiation treatment. The patient will return to radiation oncology clinic for routine followup in one month. I advised the patient to call or return sooner if they have any questions or concerns related to their recovery or treatment. ________________________________  Jodelle Gross, M.D., Ph.D.  This document serves as a record of services personally performed by Kyung Rudd, MD. It was created on his behalf by Judith Mcneil, a trained medical scribe. The creation of this record is based on the scribe's personal observations and the provider's statements to them. This document has been checked and approved by the attending provider.

## 2018-12-02 ENCOUNTER — Ambulatory Visit: Payer: BLUE CROSS/BLUE SHIELD

## 2018-12-02 DIAGNOSIS — R600 Localized edema: Secondary | ICD-10-CM

## 2018-12-02 DIAGNOSIS — C50412 Malignant neoplasm of upper-outer quadrant of left female breast: Secondary | ICD-10-CM

## 2018-12-02 DIAGNOSIS — Z170 Estrogen receptor positive status [ER+]: Secondary | ICD-10-CM

## 2018-12-02 DIAGNOSIS — Z483 Aftercare following surgery for neoplasm: Secondary | ICD-10-CM

## 2018-12-02 DIAGNOSIS — R293 Abnormal posture: Secondary | ICD-10-CM

## 2018-12-02 NOTE — Therapy (Addendum)
[place], [place], [address] Phone: [phone]   Fax:  [phone]  Physical Therapy Treatment  Patient Details  Name: Judith Mcneil MRN: [id number] Date of Birth: 03-11-1956 Referring Provider (PT): Dr. Excell Seltzer   Encounter Date: 12/02/2018  PT End of Session - 12/02/18 0847    Visit Number  13    Number of Visits  18    Date for PT Re-Evaluation  12/23/18    PT Start Time  0807   Therapist running late   PT Stop Time  0846    PT Time Calculation (min)  39 min    Activity Tolerance  Patient tolerated treatment well    Behavior During Therapy  [facility] for tasks assessed/performed       Past Medical History:  Diagnosis Date  . Anxiety   . Family history of breast cancer   . Family history of colon cancer in father   . Medical history non-contributory   . PONV (postoperative nausea and vomiting)     Past Surgical History:  Procedure Laterality Date  . BREAST LUMPECTOMY WITH RADIOACTIVE SEED AND SENTINEL LYMPH NODE BIOPSY Left 08/01/2018   Procedure: LEFT BREAST LUMPECTOMY WITH RADIOACTIVE SEED AND SENTINEL LYMPH NODE BIOPSY;  Surgeon: Excell Seltzer, MD;  Location: [facility];  Service: General;  Laterality: Left;  . BREAST LUMPECTOMY WITH RADIOACTIVE SEED LOCALIZATION Right 08/01/2018   Procedure: RIGHT BREAST LUMPECTOMY X 2  WITH RADIOACTIVE SEED RIGHT LOCALIZATION X 2;  Surgeon: Excell Seltzer, MD;  Location: [facility];  Service: General;  Laterality: Right;  . COLONOSCOPY     x 2  . DILATATION & CURETTAGE/HYSTEROSCOPY WITH TRUECLEAR N/A 08/14/2013   Procedure: DILATATION & CURETTAGE/HYSTEROSCOPY WITH TRUECLEAR;  Surgeon: Logan Bores, MD;  Location: [facility];  Service: Gynecology;  Laterality: N/A;  1 hr in the OR  . RE-EXCISION OF BREAST LUMPECTOMY Right 08/20/2018   Procedure: RE-EXCISION OF BREAST LUMPECTOMY;  Surgeon: Excell Seltzer,  MD;  Location: [facility];  Service: General;  Laterality: Right;  . RE-EXCISION OF BREAST LUMPECTOMY Right 09/16/2018   Procedure: RE-EXCISION OF BREAST LUMPECTOMY;  Surgeon: Excell Seltzer, MD;  Location: [facility];  Service: General;  Laterality: Right;  . TONSILLECTOMY     age 30    There were no vitals filed for this visit.  Subjective Assessment - 12/02/18 0811    Subjective  I keep having little sores pop up on my chest since ending radiation, but htey come and go and I've been using dermaplast and now neosporin.    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 She had a re-excision on the right breast on 08/20/18. It is ER/PR positive and HER2 negative with a Ki67 of 3%.     Patient Stated Goals  To get decreased swelling in her left breast    Currently in Pain?  No/denies                       [facility] Adult PT Treatment/Exercise - 12/02/18 0001      Manual Therapy   Soft tissue mobilization  around breast incision    Manual Lymphatic Drainage (MLD)  short neck, superficial and deep abdomen,  left inguinal nodes and axillo-inguinal anastomosis, then left  breast directing towards pathway; reinforced axillo-inguinal pathway.                  PT Long Term Goals - 11/25/18 0934      PT LONG TERM GOAL #2   Title  Patient will report >/= 50% improvement in left breast hardness and feeling of heaviness.    Baseline  60% improvement-09/11/18; 75% improvement at this time-11/25/18    Status  Achieved      PT LONG TERM GOAL #3   Title  Improve DASH score to be </= 8 for improved overall shoulder function.    Baseline  18.18, 09/29/18- 6.82    Status  Achieved      PT LONG TERM GOAL #4   Title  Patient will verbalize good understanding of  lymphedema / edema management including self manual lymph drainage.    Status  Achieved      PT LONG TERM GOAL #5   Title  Pt will report a 75% improvement in hardness of scar tissue under left lumpectomy scar     Baseline  50% improvement since finishing radiation-11/25/18    Status  On-going            Plan - 12/02/18 0848    Clinical Impression Statement  Pt presents today with much softening of Lt breast tissue and scar tissue as well. She reports noticing overall good improvements with swelling except has intermittent sores since finishing radiation. Pt would like to return for at least one more scheduled visit so as to assess sores aren't affecting swelling. If all well pt should be ready forD/C at next visit.     Rehab Potential  Good    Clinical Impairments Affecting Rehab Potential  pt recently finished radiation    PT Frequency  1x / week    PT Duration  4 weeks    PT Treatment/Interventions  ADLs/Self Care Home Management;Therapeutic exercise;Patient/family education;Manual techniques;Passive range of motion;Manual lymph drainage;DME Instruction    PT Next Visit Plan  If breast doing well probable D/C next visit; Continue MLD to left breast and scar mobilization to left lumpectomy scar, assess for increase in swelling since ending radiation    Consulted and Agree with Plan of Care  Patient       Patient will benefit from skilled therapeutic intervention in order to improve the following deficits and impairments:  Decreased knowledge of precautions, Impaired UE functional use, Decreased range of motion, Postural dysfunction, Pain, Increased edema  Visit Diagnosis: Localized edema  Aftercare following surgery for neoplasm  Abnormal posture  Malignant neoplasm of upper-outer quadrant of left breast in female, estrogen receptor positive ([place])     Problem List Patient Active Problem List   Diagnosis Date Noted  . Adjustment disorder with anxiety 11/06/2018  . Ductal  carcinoma in situ (DCIS) of right breast 10/01/2018  . Genetic testing 07/22/2018  . Family history of breast cancer   . Family history of colon cancer in father   . Malignant neoplasm of upper-outer quadrant of left breast in female, estrogen receptor positive ([place]) 06/24/2018    Otelia Limes, PTA 12/02/2018, 8:50 AM  [place], [place], [address] Phone: [phone]   Fax:  [phone]  Name: Judith Mcneil MRN: [id number] Date of Birth: May 02, 1956  PHYSICAL THERAPY DISCHARGE SUMMARY  Visits from Start of Care: 13  Current functional level related to goals / functional outcomes: See above, almost all goals met  Remaining deficits: See above   Education / Equipment: HEP  Plan: Patient agrees to discharge.  Patient goals were partially met. Patient is being discharged due to not returning since the last visit.  ?????    Allyson Sabal [place], [place] 02/16/19 2:24 PM

## 2018-12-09 ENCOUNTER — Ambulatory Visit: Payer: BLUE CROSS/BLUE SHIELD | Admitting: Physical Therapy

## 2018-12-18 ENCOUNTER — Ambulatory Visit: Payer: Self-pay | Admitting: Radiation Oncology

## 2018-12-19 ENCOUNTER — Ambulatory Visit
Admission: RE | Admit: 2018-12-19 | Discharge: 2018-12-19 | Disposition: A | Discharge status: Home / Self Care | Payer: BLUE CROSS/BLUE SHIELD | Source: Ambulatory Visit | Attending: Hematology and Oncology | Admitting: Hematology and Oncology

## 2018-12-19 DIAGNOSIS — E2839 Other primary ovarian failure: Secondary | ICD-10-CM

## 2019-01-07 ENCOUNTER — Ambulatory Visit
Admission: RE | Admit: 2019-01-07 | Discharge: 2019-01-07 | Disposition: A | Discharge status: Home / Self Care | Payer: BLUE CROSS/BLUE SHIELD | Source: Ambulatory Visit | Attending: Radiation Oncology | Admitting: Radiation Oncology

## 2019-01-07 ENCOUNTER — Encounter: Payer: Self-pay | Admitting: Radiation Oncology

## 2019-01-07 ENCOUNTER — Other Ambulatory Visit: Payer: Self-pay

## 2019-01-07 VITALS — BP 132/84 | HR 88 | Temp 98.6°F | Resp 20 | Ht 64.0 in | Wt 180.8 lb

## 2019-01-07 DIAGNOSIS — C50912 Malignant neoplasm of unspecified site of left female breast: Secondary | ICD-10-CM | POA: Diagnosis not present

## 2019-01-07 DIAGNOSIS — Z170 Estrogen receptor positive status [ER+]: Secondary | ICD-10-CM | POA: Diagnosis not present

## 2019-01-07 DIAGNOSIS — Z79899 Other long term (current) drug therapy: Secondary | ICD-10-CM | POA: Insufficient documentation

## 2019-01-07 DIAGNOSIS — C50412 Malignant neoplasm of upper-outer quadrant of left female breast: Secondary | ICD-10-CM

## 2019-01-07 NOTE — Progress Notes (Signed)
Radiation Oncology         [phone] ________________________________  Name: Judith Mcneil MRN: [id number]  Date of Service: 01/07/2019  DOB: [birth year]  Post Treatment Note  CC: Vernie Shanks, MD  Nicholas Lose, MD  Diagnosis:   Stage IA, pT1cN0M0, grade 1, ER/PR positive invasive ductal carcinoma with LCIS of the left breast with high grade, ER/PR positive DCIS of the right breast.  Interval Since Last Radiation:  7 weeks   10/21/18 - 11/18/18:  The patient initially received a dose of 42.56 Gy in 16 fractions to each breast using whole-breast tangent fields. This was delivered using a 3-D conformal technique. The patient then received a boost to each seroma. This delivered an additional 8 Gy in 4 fractions using a 3 field photon technique due to the depth of the seroma. The total dose was 50 Gy.  Narrative:  The patient returns today for routine follow-up. During treatment she did very well with radiotherapy and did not have significant desquamation.                             On review of systems, the patient states she is doing well. She denies any concerns with her skin and reports the majority of the skin pigmentation has resolved. No other concerns are noted.   ALLERGIES:  has No Known Allergies.  Meds: Current Outpatient Medications  Medication Sig Dispense Refill  . ALPRAZolam (XANAX) 0.25 MG tablet Take 1 tablet (0.25 mg total) by mouth at bedtime as needed for anxiety. 90 tablet 1  . buPROPion (WELLBUTRIN XL) 150 MG 24 hr tablet Take 150 mg by mouth daily. 1/d x 1 wk, stop    . FLUoxetine (PROZAC) 10 MG capsule Take 1 capsule (10 mg total) by mouth daily. 90 capsule 1  . ibuprofen (ADVIL,MOTRIN) 200 MG tablet Take 400 mg by mouth every 6 (six) hours as needed.    Marland Kitchen letrozole (FEMARA) 2.5 MG tablet Take 1 tablet (2.5 mg total) by mouth daily. 90 tablet 3  . loratadine (CLARITIN) 10 MG tablet Take 10 mg by mouth daily.    Marland Kitchen oxyCODONE (OXY IR/ROXICODONE) 5 MG  immediate release tablet Take 1 tablet (5 mg total) by mouth every 6 (six) hours as needed for moderate pain, severe pain or breakthrough pain. 12 tablet 0  . valACYclovir (VALTREX) 1000 MG tablet Take 1,000 mg by mouth daily as needed (outbreak).     No current facility-administered medications for this encounter.     Physical Findings:  height is 5\' 4"  (1.626 m) and weight is 180 lb 12.8 oz (82 kg). Her oral temperature is 98.6 F (37 C). Her blood pressure is 132/84 and her pulse is 88. Her respiration is 20 and oxygen saturation is 98%.  Pain Assessment Pain Score: 0-No pain/10 In general this is a well appearing caucasian female in no acute distress. She's alert and oriented x4 and appropriate throughout the examination. Cardiopulmonary assessment is negative for acute distress and she exhibits normal effort. Bilateral breasts were examined and reveal no desqumation. Mild hyperpigmentation is noted in the middle of the chest.  Lab Findings: Lab Results  Component Value Date   WBC 6.9 06/25/2018   HGB 13.9 06/25/2018   HCT 41.7 06/25/2018   MCV 92.8 06/25/2018   PLT 262 06/25/2018     Radiographic Findings: Dg Bone Density  Result Date: 12/19/2018 EXAM: DUAL X-RAY ABSORPTIOMETRY (DXA) FOR BONE MINERAL  DENSITY IMPRESSION: Referring Physician:  Nicholas Lose Your patient completed a BMD test using Lunar IDXA DXA system ( analysis version: 16 ) manufactured by [employer]. Technologist: WLS PATIENT: Name: Judith, Mcneil Patient ID: [id number] Birth Date: [birth year] Height: 64.0 in. Sex: Female Measured: 12/19/2018 Weight: 176.8 lbs. Indications: Breast Cancer History, Caucasian, Estrogen Deficient, Postmenopausal Fractures: None Treatments: Calcium (E943.0), Vitamin D (E933.5) ASSESSMENT: The BMD measured at Femur Neck Right is 0.852 g/cm2 with a T-score of -1.3. This patient is considered osteopenic according to [facility]) criteria. The scan quality is good.  Lumbar spine was not utilized due to advanced degenerative changes. Site Region Measured Date Measured Age YA BMD Significant CHANGE T-score DualFemur Neck Right 12/19/2018 64.5 -1.3 0.852 g/cm2 DualFemur Total Mean 12/19/2018 64.5 -0.5 0.944 g/cm2 Left Forearm Radius 33% 12/19/2018 64.5 -0.6 0.837 g/cm2 World Health Organization [facility]) criteria for post-menopausal, Caucasian Women: Normal       T-score at or above -1 SD Osteopenia   T-score between -1 and -2.5 SD Osteoporosis T-score at or below -2.5 SD RECOMMENDATION: 1. All patients should optimize calcium and vitamin D intake. 2. Consider FDA approved medical therapies in postmenopausal women and men aged 23 years and older, based on the following: a. A hip or vertebral (clinical or morphometric) fracture b. T- score < or = -2.5 at the femoral neck or spine after appropriate evaluation to exclude secondary causes c. Low bone mass (T-score between -1.0 and -2.5 at the femoral neck or spine) and a 10 year probability of a hip fracture > or = 3% or a 10 year probability of a major osteoporosis-related fracture > or = 20% based on the [place]-adapted WHO algorithm d. Clinician judgment and/or patient preferences may indicate treatment for people with 10-year fracture probabilities above or below these levels FOLLOW-UP: People with diagnosed cases of osteoporosis or at high risk for fracture should have regular bone mineral density tests. For patients eligible for Medicare, routine testing is allowed once every 2 years. The testing frequency can be increased to one year for patients who have rapidly progressing disease, those who are receiving or discontinuing medical therapy to restore bone mass, or have additional risk factors. I have reviewed this report and agree with the above findings. [facility] FRAX* 10-year Probability of Fracture Based on femoral neck BMD: DualFemur (Right) Major Osteoporotic Fracture: 8.2% Hip Fracture:                0.7% Population:                   [place] (Caucasian) Risk Factors:                None *FRAX is a Materials engineer of the [employer] for Metabolic Bone Disease, a World Pharmacologist (WHO) [facility]. ASSESSMENT: The probability of a major osteoporotic fracture is 8.2 % within the next ten years. The probability of hip fracture is  0.7  % within the next 10 years. Electronically Signed   By: Lajean Manes M.D.   On: 12/19/2018 07:22    Impression/Plan: 1. Stage IA, pT1cN0M0, grade 1, ER/PR positive invasive ductal carcinoma with LCIS of the left breast with high grade, ER/PR positive DCIS of the right breast. The patient has been doing well since completion of radiotherapy. We discussed that we would be happy to continue to follow her as needed, but she will also continue to follow up with Dr. Lindi Adie in medical  oncology. She was counseled on skin care as well as measures to avoid sun exposure to this area.  2. Survivorship. We discussed the importance of survivorship evaluation and she is is currently scheduled for this in the near future. She was also given the monthly calendar for access to resources offered within the cancer center.     Carola Rhine, PAC

## 2019-02-18 ENCOUNTER — Telehealth: Payer: Self-pay

## 2019-02-18 NOTE — Telephone Encounter (Signed)
LVM for patient reminding of SCP visit with NP on 02/27/19 at 2 pm.  Number for center left for patient in need of call back.

## 2019-02-27 ENCOUNTER — Inpatient Hospital Stay: Payer: BLUE CROSS/BLUE SHIELD | Attending: Adult Health | Admitting: Adult Health

## 2019-02-27 ENCOUNTER — Telehealth: Payer: Self-pay | Admitting: Hematology and Oncology

## 2019-02-27 ENCOUNTER — Encounter: Payer: Self-pay | Admitting: Adult Health

## 2019-02-27 VITALS — BP 121/74 | HR 103 | Temp 98.6°F | Resp 18 | Ht 64.0 in | Wt 180.6 lb

## 2019-02-27 DIAGNOSIS — Z923 Personal history of irradiation: Secondary | ICD-10-CM

## 2019-02-27 DIAGNOSIS — M791 Myalgia, unspecified site: Secondary | ICD-10-CM

## 2019-02-27 DIAGNOSIS — R232 Flushing: Secondary | ICD-10-CM | POA: Diagnosis not present

## 2019-02-27 DIAGNOSIS — F419 Anxiety disorder, unspecified: Secondary | ICD-10-CM

## 2019-02-27 DIAGNOSIS — Z79899 Other long term (current) drug therapy: Secondary | ICD-10-CM | POA: Diagnosis not present

## 2019-02-27 DIAGNOSIS — Z170 Estrogen receptor positive status [ER+]: Secondary | ICD-10-CM | POA: Insufficient documentation

## 2019-02-27 DIAGNOSIS — Z800 Family history of malignant neoplasm of digestive organs: Secondary | ICD-10-CM | POA: Insufficient documentation

## 2019-02-27 DIAGNOSIS — Z803 Family history of malignant neoplasm of breast: Secondary | ICD-10-CM

## 2019-02-27 DIAGNOSIS — Z79811 Long term (current) use of aromatase inhibitors: Secondary | ICD-10-CM

## 2019-02-27 DIAGNOSIS — C50412 Malignant neoplasm of upper-outer quadrant of left female breast: Secondary | ICD-10-CM | POA: Insufficient documentation

## 2019-02-27 DIAGNOSIS — D0511 Intraductal carcinoma in situ of right breast: Secondary | ICD-10-CM

## 2019-02-27 NOTE — Progress Notes (Signed)
CLINIC:  Survivorship   REASON FOR VISIT:  Routine follow-up post-treatment for a recent history of breast cancer.  BRIEF ONCOLOGIC HISTORY:    Malignant neoplasm of upper-outer quadrant of left breast in female, estrogen receptor positive ([place])   06/19/2018 Initial Diagnosis    Screening detected left breast mass and distortion on further evaluation of mass went away and the distortion did not have a sonographic correlate.  Stereotactic biopsy revealed grade 2 IDC ER 95%, PR 90%, Ki-67 3%, HER-2 negative ratio 1.09 along with LCIS TX N0 stage Ia clinical stage     Genetic Testing    Negative genetic testing on the [facility] Breast Cancer STAT panel and Common Hereditary Cancers Panel. The STAT Breast cancer panel offered by [facility] includes sequencing and rearrangement analysis for the following 9 genes:  ATM, BRCA1, BRCA2, CDH1, CHEK2, PALB2, PTEN, STK11 and TP53.  The Common Hereditary Cancer Panel offered by [facility] includes sequencing and/or deletion duplication testing of the following 47 genes: APC, ATM, AXIN2, BARD1, BMPR1A, BRCA1, BRCA2, BRIP1, CDH1, CDKN2A (p14ARF), CDKN2A (p16INK4a), CKD4, CHEK2, CTNNA1, DICER1, EPCAM (Deletion/duplication testing only), GREM1 (promoter region deletion/duplication testing only), KIT, MEN1, MLH1, MSH2, MSH3, MSH6, MUTYH, NBN, NF1, NHTL1, PALB2, PDGFRA, PMS2, POLD1, POLE, PTEN, RAD50, RAD51C, RAD51D, SDHB, SDHC, SDHD, SMAD4, SMARCA4. STK11, TP53, TSC1, TSC2, and VHL.  The following genes were evaluated for sequence changes only: SDHA and HOXB13 c.251G>A variant only.  The report date is 07/21/2018.    08/01/2018 Surgery    Left lumpectomy: IDC grade 1, 1.2 cm, margins negative, 0/1 lymph node negative ER 94%, PR 90%, HER-2 negative ratio 1.09, Ki-67 3%, T1CN0 stage Ia; Right lumpectomy: DCIS high-grade 1.4 cm, broadly less than 0.1 cm to posterior margin, ER 100%, PR 100%, Tis NX stage 0    08/08/2018 Oncotype testing    Oncotype DX score 19: Low risk 6%  distant recurrence risk at 9 years    08/27/2018 Cancer Staging    Staging form: Breast, AJCC 8th Edition - Pathologic: Stage IA (pT1c, pN0, cM0, G1, ER+, PR+, HER2-, Oncotype DX score: 19) - Signed by Gardenia Phlegm, NP on 08/27/2018    10/21/2018 - 11/17/2018 Radiation Therapy    Adjuvant radiation therapy    11/2018 -  Anti-estrogen oral therapy    Letrozole daily     INTERVAL HISTORY:  Judith Mcneil presents to the [facility] today for our initial meeting to review her survivorship care plan detailing her treatment course for breast cancer, as well as monitoring long-term side effects of that treatment, education regarding health maintenance, screening, and overall wellness and health promotion.     Overall, Judith Mcneil reports feeling quite well.  She is taking Letrozole every day.  These are most noted at night and will wake her up one to two times.  These are manageable.  She does have some vaginal dryness that has been going on for a while.  She is managing this well with lubricants. She uses vaginal estrace once a week.  She has some arthralgias in her hands, elbow and hips.  She takes advil PM at night.  She came to Yoga, and is going to start doing that.     REVIEW OF SYSTEMS:  Review of Systems  Constitutional: Negative for appetite change, chills, fatigue, fever and unexpected weight change.  HENT:   Negative for hearing loss, lump/mass, mouth sores, sore throat and trouble swallowing.   Eyes: Negative for eye problems and icterus.  Respiratory: Negative for  chest tightness, cough and shortness of breath.   Cardiovascular: Negative for chest pain, leg swelling and palpitations.  Gastrointestinal: Negative for abdominal distention, abdominal pain, constipation, diarrhea, nausea and vomiting.  Endocrine: Negative for hot flashes.  Musculoskeletal: Negative for arthralgias and back pain.  Skin: Negative for itching and rash.  Neurological: Negative for  dizziness, extremity weakness, headaches and numbness.  Hematological: Negative for adenopathy. Does not bruise/bleed easily.  Psychiatric/Behavioral: Negative for depression and sleep disturbance. The patient is not nervous/anxious.   Breast: Denies any new nodularity, masses, tenderness, nipple changes, or nipple discharge.    ONCOLOGY TREATMENT TEAM:  1. Surgeon:  Dr. Excell Seltzer at [facility] 2. Medical Oncologist: Dr. Lindi Adie  3. Radiation Oncologist: Dr. Lisbeth Renshaw    PAST MEDICAL/SURGICAL HISTORY:  Past Medical History:  Diagnosis Date  . Anxiety   . Family history of breast cancer   . Family history of colon cancer in father   . Medical history non-contributory   . PONV (postoperative nausea and vomiting)    Past Surgical History:  Procedure Laterality Date  . BREAST LUMPECTOMY WITH RADIOACTIVE SEED AND SENTINEL LYMPH NODE BIOPSY Left 08/01/2018   Procedure: LEFT BREAST LUMPECTOMY WITH RADIOACTIVE SEED AND SENTINEL LYMPH NODE BIOPSY;  Surgeon: Excell Seltzer, MD;  Location: [place];  Service: General;  Laterality: Left;  . BREAST LUMPECTOMY WITH RADIOACTIVE SEED LOCALIZATION Right 08/01/2018   Procedure: RIGHT BREAST LUMPECTOMY X 2  WITH RADIOACTIVE SEED RIGHT LOCALIZATION X 2;  Surgeon: Excell Seltzer, MD;  Location: [place];  Service: General;  Laterality: Right;  . COLONOSCOPY     x 2  . DILATATION & CURETTAGE/HYSTEROSCOPY WITH TRUECLEAR N/A 08/14/2013   Procedure: DILATATION & CURETTAGE/HYSTEROSCOPY WITH TRUECLEAR;  Surgeon: Logan Bores, MD;  Location: [facility];  Service: Gynecology;  Laterality: N/A;  1 hr in the OR  . RE-EXCISION OF BREAST LUMPECTOMY Right 08/20/2018   Procedure: RE-EXCISION OF BREAST LUMPECTOMY;  Surgeon: Excell Seltzer, MD;  Location: [place];  Service: General;  Laterality: Right;  . RE-EXCISION OF BREAST LUMPECTOMY Right 09/16/2018   Procedure: RE-EXCISION OF BREAST LUMPECTOMY;   Surgeon: Excell Seltzer, MD;  Location: [facility];  Service: General;  Laterality: Right;  . TONSILLECTOMY     age 60     ALLERGIES:  No Known Allergies   CURRENT MEDICATIONS:  Outpatient Encounter Medications as of 02/27/2019  Medication Sig  . ALPRAZolam (XANAX) 0.25 MG tablet Take 1 tablet (0.25 mg total) by mouth at bedtime as needed for anxiety.  Marland Kitchen FLUoxetine (PROZAC) 10 MG capsule Take 1 capsule (10 mg total) by mouth daily.  Marland Kitchen ibuprofen (ADVIL,MOTRIN) 200 MG tablet Take 400 mg by mouth every 6 (six) hours as needed.  Marland Kitchen letrozole (FEMARA) 2.5 MG tablet Take 1 tablet (2.5 mg total) by mouth daily.  Marland Kitchen loratadine (CLARITIN) 10 MG tablet Take 10 mg by mouth daily.  . valACYclovir (VALTREX) 1000 MG tablet Take 1,000 mg by mouth daily as needed (outbreak).  . [DISCONTINUED] buPROPion (WELLBUTRIN XL) 150 MG 24 hr tablet Take 150 mg by mouth daily. 1/d x 1 wk, stop  . [DISCONTINUED] oxyCODONE (OXY IR/ROXICODONE) 5 MG immediate release tablet Take 1 tablet (5 mg total) by mouth every 6 (six) hours as needed for moderate pain, severe pain or breakthrough pain. (Patient not taking: Reported on 02/27/2019)   No facility-administered encounter medications on file as of 02/27/2019.      ONCOLOGIC FAMILY HISTORY:  Family History  Problem Relation Age of Onset  . Alzheimer's disease Mother   . Colon cancer Father        dx mid 43s  . Breast cancer Paternal Grandmother        dx 27s  . Breast cancer Paternal Aunt        dx 42s  . Schizophrenia Daughter      GENETIC COUNSELING/TESTING: negative  SOCIAL HISTORY:  Social History   Socioeconomic History  . Marital status: Widowed    Spouse name: Not on file  . Number of children: Not on file  . Years of education: Not on file  . Highest education level: Not on file  Occupational History  . Not on file  Social Needs  . Financial resource strain: Not on file  . Food insecurity:    Worry: Not on file    Inability:  Not on file  . Transportation needs:    Medical: No    Non-medical: No  Tobacco Use  . Smoking status: Never Smoker  . Smokeless tobacco: Never Used  Substance and Sexual Activity  . Alcohol use: Yes    Alcohol/week: 1.0 standard drinks    Types: 1 Glasses of wine per week    Comment: few times a week  . Drug use: No  . Sexual activity: Not on file  Lifestyle  . Physical activity:    Days per week: Not on file    Minutes per session: Not on file  . Stress: Not on file  Relationships  . Social connections:    Talks on phone: Not on file    Gets together: Not on file    Attends religious service: Not on file    Active member of club or organization: Not on file    Attends meetings of clubs or organizations: Not on file    Relationship status: Not on file  . Intimate partner violence:    Fear of current or ex partner: No    Emotionally abused: No    Physically abused: No    Forced sexual activity: No  Other Topics Concern  . Not on file  Social History Narrative   Pt lives alone, pets in the home       PHYSICAL EXAMINATION:  Vital Signs:   Vitals:   02/27/19 1400  BP: 121/74  Pulse: (!) 103  Resp: 18  Temp: 98.6 F (37 C)  SpO2: 98%   Filed Weights   02/27/19 1400  Weight: 180 lb 9.6 oz (81.9 kg)   General: Well-nourished, well-appearing female in no acute distress.  She is unaccompanied today.   HEENT: Head is normocephalic.  Pupils equal and reactive to light. Conjunctivae clear without exudate.  Sclerae anicteric. Oral mucosa is pink, moist.  Oropharynx is pink without lesions or erythema.  Lymph: No cervical, supraclavicular, or infraclavicular lymphadenopathy noted on palpation.  Cardiovascular: Regular rate and rhythm.Marland Kitchen Respiratory: Clear to auscultation bilaterally. Chest expansion symmetric; breathing non-labored.  Breasts: bilateral breasts s/p lumpectomy and radiation, no sign of local recurrence GI: Abdomen soft and round; non-tender,  non-distended. Bowel sounds normoactive.  GU: Deferred.  Neuro: No focal deficits. Steady gait.  Psych: Mood and affect normal and appropriate for situation.  Extremities: No edema. MSK: No focal spinal tenderness to palpation.  Full range of motion in bilateral upper extremities Skin: Warm and dry.  LABORATORY DATA:  None for this visit.  DIAGNOSTIC IMAGING:  None for this visit.      ASSESSMENT AND PLAN:  Ms.. Mcneil is a pleasant 63 y.o. female with bilateral breast cancer, ER+/PR+/HER2-, diagnosed in 05/2018, treated with lumpectomies, adjuvant radiation therapy, and anti-estrogen therapy with Letrozole beginning in 01/2019.  She presents to the Survivorship Clinic for our initial meeting and routine follow-up post-completion of treatment for breast cancer.    1. Bilateral breast cancer:  Judith Mcneil is continuing to recover from definitive treatment for breast cancer. She will follow-up with her medical oncologist, Dr. Cleda Daub 09/2019 with history and physical exam per surveillance protocol.  She will continue her anti-estrogen therapy with Letrozole. Thus far, she is tolerating the Letrozole well, with minimal side effects.  Today, a comprehensive survivorship care plan and treatment summary was reviewed with the patient today detailing her breast cancer diagnosis, treatment course, potential late/long-term effects of treatment, appropriate follow-up care with recommendations for the future, and patient education resources.  A copy of this summary, along with a letter will be sent to the patient's primary care provider via mail/fax/In Basket message after today's visit.    2. Bone health:  Given Judith Mcneil's age/history of breast cancer and her current treatment regimen including anti-estrogen therapy with Letrozole, she is at risk for bone demineralization.  Her last DEXA scan was 12/19/2018 and showed osteopenia with a T score of -1.3 in the right femur.  In the meantime, she  was encouraged to increase her consumption of foods rich in calcium, as well as increase her weight-bearing activities.  She was given education on specific activities to promote bone health.  3. Cancer screening:  Due to Judith Mcneil's history and her age, she should receive screening for skin cancers, colon cancer, and gynecologic cancers.  The information and recommendations are listed on the patient's comprehensive care plan/treatment summary and were reviewed in detail with the patient.    4. Health maintenance and wellness promotion: Judith Mcneil was encouraged to consume 5-7 servings of fruits and vegetables per day. We reviewed the "Nutrition Rainbow" handout, as well as the handout "Take Control of Your Health and Reduce Your Cancer Risk" from the [employer].  She was also encouraged to engage in moderate to vigorous exercise for 30 minutes per day most days of the week. We discussed the [employer] fitness program, which is designed for cancer survivors to help them become more physically fit after cancer treatments.  She was instructed to limit her alcohol consumption and continue to abstain from tobacco use.     5. Support services/counseling: It is not uncommon for this period of the patient's cancer care trajectory to be one of many emotions and stressors.  We discussed an opportunity for her to participate in the next session of [facility] ("Finding Your New Normal") support group series designed for patients after they have completed treatment.   Judith Mcneil was encouraged to take advantage of our many other support services programs, support groups, and/or counseling in coping with her new life as a cancer survivor after completing anti-cancer treatment.  She was offered support today through active listening and expressive supportive counseling.  She was given information regarding our available services and encouraged to contact me with any questions or for help enrolling  in any of our support group/programs.    Dispo:   -Return to cancer center in 09/2019 for f/u with Dr. Lindi Adie -Mammogram due in 05/2019 -Bone density due 11/2020 -Follow up with Dr. Excell Seltzer in 03/2019 -She is welcome to return back to the Survivorship Clinic at any time; no additional follow-up  needed at this time.  -Consider referral back to survivorship as a long-term survivor for continued surveillance  A total of (30) minutes of face-to-face time was spent with this patient with greater than 50% of that time in counseling and care-coordination.   Gardenia Phlegm, NP Survivorship Program [facility] [phone]   Note: PRIMARY CARE PROVIDER Vernie Shanks, [place] [phone]

## 2019-02-27 NOTE — Telephone Encounter (Signed)
Gave avs and calendar ° °

## 2019-05-07 ENCOUNTER — Other Ambulatory Visit: Payer: Self-pay

## 2019-05-07 MED ORDER — ALPRAZOLAM 0.25 MG PO TABS: 0.2500 mg | ORAL_TABLET | Freq: Every evening | ORAL | 0 refills | Status: DC | PRN

## 2019-05-07 MED ORDER — FLUOXETINE HCL 10 MG PO CAPS: 10.0000 mg | ORAL_CAPSULE | Freq: Every day | ORAL | 0 refills | Status: DC

## 2019-06-01 ENCOUNTER — Ambulatory Visit: Payer: BLUE CROSS/BLUE SHIELD | Admitting: Psychiatry

## 2019-06-09 ENCOUNTER — Ambulatory Visit: Payer: BLUE CROSS/BLUE SHIELD | Admitting: Psychiatry

## 2019-06-11 ENCOUNTER — Ambulatory Visit: Payer: BLUE CROSS/BLUE SHIELD | Admitting: Psychiatry

## 2019-06-11 ENCOUNTER — Other Ambulatory Visit: Payer: Self-pay

## 2019-06-11 ENCOUNTER — Encounter: Payer: Self-pay | Admitting: Psychiatry

## 2019-06-11 DIAGNOSIS — F411 Generalized anxiety disorder: Secondary | ICD-10-CM | POA: Diagnosis not present

## 2019-06-11 DIAGNOSIS — G4700 Insomnia, unspecified: Secondary | ICD-10-CM

## 2019-06-11 MED ORDER — DOXEPIN HCL 3 MG PO TABS: ORAL_TABLET | ORAL | 0 refills | Status: DC

## 2019-06-11 MED ORDER — FLUOXETINE HCL 10 MG PO CAPS: ORAL_CAPSULE | ORAL | 0 refills | Status: DC

## 2019-06-11 NOTE — Progress Notes (Signed)
BETHANNIE IGLEHART [id number] 1956-04-13 63 y.o.  Subjective:   Patient ID:  Judith Mcneil is a 63 y.o. (DOB 05/07/1956) female.  Chief Complaint:  Chief Complaint  Patient presents with  . Anxiety    HPI DAILYNN Judith Mcneil presents to the office today for follow-up of anxiety. She is a previous pt of [employer], [place]. Pt reports that she has had anxiety off and on, typically in response to situational stressors related to daughter with schizophrenia. She reports that she has more worry and inattention with anxiety. Denies panic s/s. Describes anxiety as currently "ok." Reports that she has been having some difficulty with sleep. Reports that Xanax has been helping with sleep initiation and then waking up with different things on her mind. Reports multiple awakenings throughout the night. Estimates sleeping 6-7 hours a night. Denies nightmares. Denies current depressed mood. She reports that she has been eating more and reports "I'm a nervous eater." She reports that she has been walking when the weather permits. She reports that her energy is ok. Motivation is somewhat low. Reports that her concentration is fair. Reports concentration is better on the days that she walks. Denies SI.   Denies significant h/o depression other than some situational depression when husband was dying.   Youngest daughter has schizophrenia. Daughter is 68 yo and has been staying with pt since March when daughter's job ended due to the pandemic. Daughter has apt to possibly start going to [employer]. Also had an older daughter who is married and has a child and lives in [place]. Husband died 65 years ago  Past Psychiatric Medication Trials: Wellbutrin XL- Had increased anxiety at 300 mg  Celexa- Took for situational depression when husband was terminally ill. Effective.  Prozac-Effective Zoloft- Felt spacey Remeron- Felt "foggy the next day."  Xanax Klonopin- Helped with sleep duration but took longer to  take effect.   Review of Systems:  Review of Systems  Constitutional: Positive for diaphoresis.       Reports that she has been having hot flashes and attributes this to Letrozole.   Musculoskeletal: Negative for gait problem.  Neurological: Negative for tremors.  Psychiatric/Behavioral:       Please refer to HPI    Was cancer free in December. Has upcoming f/u mammogram.   Medications: I have reviewed the patient's current medications.  Current Outpatient Medications  Medication Sig Dispense Refill  . ALPRAZolam (XANAX) 0.25 MG tablet Take 1 tablet (0.25 mg total) by mouth at bedtime as needed for anxiety. 90 tablet 0  . Calcium Citrate-Vitamin D (CALCIUM CITRATE + D3 PO) Take by mouth.    Marland Kitchen FLUoxetine (PROZAC) 10 MG capsule Take 1-2 capsules po qd 180 capsule 0  . ibuprofen (ADVIL,MOTRIN) 200 MG tablet Take 400 mg by mouth every 6 (six) hours as needed.    Marland Kitchen letrozole (FEMARA) 2.5 MG tablet Take 1 tablet (2.5 mg total) by mouth daily. 90 tablet 3  . loratadine (CLARITIN) 10 MG tablet Take 10 mg by mouth daily.    . Multiple Vitamin (MULTIVITAMIN) tablet Take 1 tablet by mouth daily.    . Omega-3 Fatty Acids (FISH OIL PO) Take by mouth.    . valACYclovir (VALTREX) 1000 MG tablet Take 1,000 mg by mouth daily as needed (outbreak).    . Doxepin HCl (SILENOR) 3 MG TABS Take 3- 6 mg po QHS 8 tablet 0   No current facility-administered medications for this visit.     Medication Side Effects: Other:  Possible weight gain.   Allergies: No Known Allergies  Past Medical History:  Diagnosis Date  . Anxiety   . Breast cancer ([place])   . Family history of breast cancer   . Family history of colon cancer in father   . Medical history non-contributory   . PONV (postoperative nausea and vomiting)     Family History  Problem Relation Age of Onset  . Alzheimer's disease Mother   . Colon cancer Father        dx mid 71s  . Breast cancer Paternal Grandmother        dx 27s  . Breast  cancer Paternal Aunt        dx 27s  . Schizophrenia Daughter     Social History   Socioeconomic History  . Marital status: Widowed    Spouse name: Not on file  . Number of children: Not on file  . Years of education: Not on file  . Highest education level: Not on file  Occupational History  . Not on file  Social Needs  . Financial resource strain: Not on file  . Food insecurity    Worry: Not on file    Inability: Not on file  . Transportation needs    Medical: No    Non-medical: No  Tobacco Use  . Smoking status: Never Smoker  . Smokeless tobacco: Never Used  Substance and Sexual Activity  . Alcohol use: Yes    Alcohol/week: 1.0 standard drinks    Types: 1 Glasses of wine per week    Comment: few times a week  . Drug use: No  . Sexual activity: Not on file  Lifestyle  . Physical activity    Days per week: Not on file    Minutes per session: Not on file  . Stress: Not on file  Relationships  . Social Herbalist on phone: Not on file    Gets together: Not on file    Attends religious service: Not on file    Active member of club or organization: Not on file    Attends meetings of clubs or organizations: Not on file    Relationship status: Not on file  . Intimate partner violence    Fear of current or ex partner: No    Emotionally abused: No    Physically abused: No    Forced sexual activity: No  Other Topics Concern  . Not on file  Social History Narrative   Pt lives alone, pets in the home     Past Medical History, Surgical history, Social history, and Family history were reviewed and updated as appropriate.   Please see review of systems for further details on the patient's review from today.   Objective:   Physical Exam:  There were no vitals taken for this visit.  Physical Exam Constitutional:      General: She is not in acute distress.    Appearance: She is well-developed.  Musculoskeletal:        General: No deformity.   Neurological:     Mental Status: She is alert and oriented to person, place, and time.     Coordination: Coordination normal.  Psychiatric:        Attention and Perception: Attention and perception normal. She does not perceive auditory or visual hallucinations.        Mood and Affect: Mood is anxious. Mood is not depressed. Affect is not labile, blunt, angry or inappropriate.  Speech: Speech normal.        Behavior: Behavior normal.        Thought Content: Thought content normal. Thought content does not include homicidal or suicidal ideation. Thought content does not include homicidal or suicidal plan.        Cognition and Memory: Cognition and memory normal.        Judgment: Judgment normal.     Comments: Insight intact. No delusions.      Lab Review:     Component Value Date/Time   NA 139 06/25/2018 1231   K 4.4 06/25/2018 1231   CL 108 06/25/2018 1231   CO2 26 06/25/2018 1231   GLUCOSE 94 06/25/2018 1231   BUN 20 06/25/2018 1231   CREATININE 0.95 06/25/2018 1231   CALCIUM 9.2 06/25/2018 1231   PROT 7.0 06/25/2018 1231   ALBUMIN 3.9 06/25/2018 1231   AST 19 06/25/2018 1231   ALT 25 06/25/2018 1231   ALKPHOS 90 06/25/2018 1231   BILITOT 0.5 06/25/2018 1231   GFRNONAA >60 06/25/2018 1231   GFRAA >60 06/25/2018 1231       Component Value Date/Time   WBC 6.9 06/25/2018 1231   WBC 8.8 08/11/2013 1620   RBC 4.49 06/25/2018 1231   HGB 13.9 06/25/2018 1231   HCT 41.7 06/25/2018 1231   PLT 262 06/25/2018 1231   MCV 92.8 06/25/2018 1231   MCH 30.9 06/25/2018 1231   MCHC 33.3 06/25/2018 1231   RDW 13.5 06/25/2018 1231   LYMPHSABS 1.9 06/25/2018 1231   MONOABS 0.4 06/25/2018 1231   EOSABS 0.1 06/25/2018 1231   BASOSABS 0.0 06/25/2018 1231    No results found for: POCLITH, LITHIUM   No results found for: PHENYTOIN, PHENOBARB, VALPROATE, CBMZ   .res Assessment: Plan:   Discussed potential beneifts, risks, and side effects of increasing Prozac to 20 mg po qd  for anxiety. Pt agrees to increase in Prozac to 20 mg po qd. Also discussed potential benefits, risks, and side effects of Silenor.  Increase Prozac to 20 mg po qd.  Start Silenor 3mg - 6 mg po QHS for insomnia.   Pt provided pt with samples and advised to contact office if Silenor is effective and she would like script sent to pharmacy. Pt to f/u in 6 weeks or sooner if clinically indicated.  Patient advised to contact office with any questions, adverse effects, or acute worsening in signs and symptoms.  Zoe was seen today for anxiety.  Diagnoses and all orders for this visit:  Anxiety state -     FLUoxetine (PROZAC) 10 MG capsule; Take 1-2 capsules po qd  Insomnia, unspecified type -     Doxepin HCl (SILENOR) 3 MG TABS; Take 3- 6 mg po QHS     Please see After Visit Summary for patient specific instructions.  Future Appointments  Date Time Provider [place]  06/19/2019  8:40 AM [facility] 1 [facility]  07/30/2019  1:30 PM Thayer Headings, PMHNP [facility] None  09/01/2019  9:15 AM Nicholas Lose, MD [facility] None    No orders of the defined types were placed in this encounter.   -------------------------------

## 2019-06-19 ENCOUNTER — Ambulatory Visit
Admission: RE | Admit: 2019-06-19 | Discharge: 2019-06-19 | Disposition: A | Discharge status: Home / Self Care | Payer: BLUE CROSS/BLUE SHIELD | Source: Ambulatory Visit | Attending: Adult Health | Admitting: Adult Health

## 2019-06-19 ENCOUNTER — Other Ambulatory Visit: Payer: Self-pay

## 2019-06-19 DIAGNOSIS — Z170 Estrogen receptor positive status [ER+]: Secondary | ICD-10-CM

## 2019-06-19 DIAGNOSIS — C50412 Malignant neoplasm of upper-outer quadrant of left female breast: Secondary | ICD-10-CM

## 2019-06-19 HISTORY — DX: Personal history of irradiation: Z92.3

## 2019-07-30 ENCOUNTER — Ambulatory Visit: Payer: BLUE CROSS/BLUE SHIELD | Admitting: Psychiatry

## 2019-08-20 DIAGNOSIS — Z0000 Encounter for general adult medical examination without abnormal findings: Secondary | ICD-10-CM | POA: Diagnosis not present

## 2019-08-20 DIAGNOSIS — Z130 Encounter for screening for diseases of the blood and blood-forming organs and certain disorders involving the immune mechanism: Secondary | ICD-10-CM | POA: Diagnosis not present

## 2019-08-20 DIAGNOSIS — Z01419 Encounter for gynecological examination (general) (routine) without abnormal findings: Secondary | ICD-10-CM | POA: Diagnosis not present

## 2019-08-20 DIAGNOSIS — Z124 Encounter for screening for malignant neoplasm of cervix: Secondary | ICD-10-CM | POA: Diagnosis not present

## 2019-08-20 DIAGNOSIS — Z6830 Body mass index (BMI) 30.0-30.9, adult: Secondary | ICD-10-CM | POA: Diagnosis not present

## 2019-08-28 ENCOUNTER — Ambulatory Visit (INDEPENDENT_AMBULATORY_CARE_PROVIDER_SITE_OTHER): Payer: BLUE CROSS/BLUE SHIELD | Admitting: Psychiatry

## 2019-08-28 ENCOUNTER — Encounter: Payer: Self-pay | Admitting: Psychiatry

## 2019-08-28 ENCOUNTER — Other Ambulatory Visit: Payer: Self-pay

## 2019-08-28 DIAGNOSIS — G4700 Insomnia, unspecified: Secondary | ICD-10-CM

## 2019-08-28 DIAGNOSIS — F411 Generalized anxiety disorder: Secondary | ICD-10-CM

## 2019-08-28 MED ORDER — ALPRAZOLAM 0.25 MG PO TABS: ORAL_TABLET | ORAL | 0 refills | Status: DC

## 2019-08-28 NOTE — Progress Notes (Signed)
REIA CAHAN YH:[id number] 1956/06/04 63 y.o.  Subjective:   Patient ID:  Judith Mcneil is a 63 y.o. (DOB [birth year]) female.  Chief Complaint:  Chief Complaint  Patient presents with  . Follow-up    Anxiety, Insomnia    HPI IEISHA MALLERY presents to the office today for follow-up of anxiety. She reports increase in Prozac has helped with anxiety. She reports that she does not try Silenor since increase in Prozac Has been sleeping 7-7.5 hours a night and sleep is more restful now. Previously fit bit was not able to track sleep due to sleep being fragmented. She reports that sometimes feeling as if Xanax 0.25 mg is not adequate for insomnia. She reports that anxiety has been manageable. She reports that her mood has been stable overall. Reports that they are walking regularly and this is helpful for her energy and motivation. Concentration is at baseline. She reports that she continues to emotionally overeat at times in response to stress. Appetite has been good. Denies SI.   Daughter was told that she needed longer period of sobriety before going to [employer]. She reports that her daughter is living with her and paying her rent. She reports that she has enjoyed having her daughter at home.   Past Psychiatric Medication Trials: Wellbutrin XL- Had increased anxiety at 300 mg  Celexa- Took for situational depression when husband was terminally ill. Effective.  Prozac-Effective Zoloft- Felt spacey Remeron- Felt "foggy the next day."  Xanax Klonopin- Helped with sleep duration but took longer to take effect.   Review of Systems:  Review of Systems  Constitutional:       Occ sweating  Musculoskeletal: Positive for arthralgias. Negative for gait problem.  Neurological: Negative for tremors.  Psychiatric/Behavioral:       Please refer to HPI   Had normal mammogram after breast cancer last year.    Medications: I have reviewed the patient's current medications.  Current  Outpatient Medications  Medication Sig Dispense Refill  . ALPRAZolam (XANAX) 0.25 MG tablet Take 1-2 tabs po QHS prn insomnia and anxiety 135 tablet 0  . Calcium Citrate-Vitamin D (CALCIUM CITRATE + D3 PO) Take by mouth.    Marland Kitchen FLUoxetine (PROZAC) 10 MG capsule Take 1-2 capsules po qd 180 capsule 0  . ibuprofen (ADVIL,MOTRIN) 200 MG tablet Take 400 mg by mouth every 6 (six) hours as needed.    . loratadine (CLARITIN) 10 MG tablet Take 10 mg by mouth daily.    . Multiple Vitamin (MULTIVITAMIN) tablet Take 1 tablet by mouth daily.    . Omega-3 Fatty Acids (FISH OIL PO) Take by mouth.    . valACYclovir (VALTREX) 1000 MG tablet Take 1,000 mg by mouth daily as needed (outbreak).    Marland Kitchen letrozole (FEMARA) 2.5 MG tablet Take 1 tablet (2.5 mg total) by mouth daily. 90 tablet 3   No current facility-administered medications for this visit.     Medication Side Effects: None  Allergies: No Known Allergies  Past Medical History:  Diagnosis Date  . Anxiety   . Breast cancer ([place])   . Family history of breast cancer   . Family history of colon cancer in father   . Medical history non-contributory   . Personal history of radiation therapy    Bilateral   . PONV (postoperative nausea and vomiting)     Family History  Problem Relation Age of Onset  . Alzheimer's disease Mother   . Colon cancer Father  dx mid 10s  . Breast cancer Paternal Grandmother        dx 23s  . Breast cancer Paternal Aunt        dx 50s  . Schizophrenia Daughter     Social History   Socioeconomic History  . Marital status: Widowed    Spouse name: Not on file  . Number of children: Not on file  . Years of education: Not on file  . Highest education level: Not on file  Occupational History  . Not on file  Social Needs  . Financial resource strain: Not on file  . Food insecurity    Worry: Not on file    Inability: Not on file  . Transportation needs    Medical: No    Non-medical: No  Tobacco Use  .  Smoking status: Never Smoker  . Smokeless tobacco: Never Used  Substance and Sexual Activity  . Alcohol use: Yes    Alcohol/week: 1.0 standard drinks    Types: 1 Glasses of wine per week    Comment: few times a week  . Drug use: No  . Sexual activity: Not on file  Lifestyle  . Physical activity    Days per week: Not on file    Minutes per session: Not on file  . Stress: Not on file  Relationships  . Social Herbalist on phone: Not on file    Gets together: Not on file    Attends religious service: Not on file    Active member of club or organization: Not on file    Attends meetings of clubs or organizations: Not on file    Relationship status: Not on file  . Intimate partner violence    Fear of current or ex partner: No    Emotionally abused: No    Physically abused: No    Forced sexual activity: No  Other Topics Concern  . Not on file  Social History Narrative   Pt lives alone, pets in the home     Past Medical History, Surgical history, Social history, and Family history were reviewed and updated as appropriate.   Please see review of systems for further details on the patient's review from today.   Objective:   Physical Exam:  There were no vitals taken for this visit.  Physical Exam Constitutional:      General: She is not in acute distress.    Appearance: She is well-developed.  Musculoskeletal:        General: No deformity.  Neurological:     Mental Status: She is alert and oriented to person, place, and time.     Coordination: Coordination normal.  Psychiatric:        Attention and Perception: Attention and perception normal. She does not perceive auditory or visual hallucinations.        Mood and Affect: Mood normal. Mood is not anxious or depressed. Affect is not labile, blunt, angry or inappropriate.        Speech: Speech normal.        Behavior: Behavior normal.        Thought Content: Thought content normal. Thought content does not  include homicidal or suicidal ideation. Thought content does not include homicidal or suicidal plan.        Cognition and Memory: Cognition and memory normal.        Judgment: Judgment normal.     Comments: Insight intact. No delusions.      Lab  Review:     Component Value Date/Time   NA 139 06/25/2018 1231   K 4.4 06/25/2018 1231   CL 108 06/25/2018 1231   CO2 26 06/25/2018 1231   GLUCOSE 94 06/25/2018 1231   BUN 20 06/25/2018 1231   CREATININE 0.95 06/25/2018 1231   CALCIUM 9.2 06/25/2018 1231   PROT 7.0 06/25/2018 1231   ALBUMIN 3.9 06/25/2018 1231   AST 19 06/25/2018 1231   ALT 25 06/25/2018 1231   ALKPHOS 90 06/25/2018 1231   BILITOT 0.5 06/25/2018 1231   GFRNONAA >60 06/25/2018 1231   GFRAA >60 06/25/2018 1231       Component Value Date/Time   WBC 6.9 06/25/2018 1231   WBC 8.8 08/11/2013 1620   RBC 4.49 06/25/2018 1231   HGB 13.9 06/25/2018 1231   HCT 41.7 06/25/2018 1231   PLT 262 06/25/2018 1231   MCV 92.8 06/25/2018 1231   MCH 30.9 06/25/2018 1231   MCHC 33.3 06/25/2018 1231   RDW 13.5 06/25/2018 1231   LYMPHSABS 1.9 06/25/2018 1231   MONOABS 0.4 06/25/2018 1231   EOSABS 0.1 06/25/2018 1231   BASOSABS 0.0 06/25/2018 1231    No results found for: POCLITH, LITHIUM   No results found for: PHENYTOIN, PHENOBARB, VALPROATE, CBMZ   .res Assessment: Plan:   Will continue Prozac 10 mg 1 to 2 capsules p.o. daily since patient reports that 20 mg dose has been more effective for anxiety and she may try to resume 10 mg in the future once acute stressors improve. We will continue alprazolam 0.25 mg 1-2 tabs p.o. nightly as needed insomnia.  Encouraged patient to use the lowest possible effective dose to minimize risk of dependence and tolerance. Patient to follow-up in 6 months or sooner if clinically indicated. Patient advised to contact office with any questions, adverse effects, or acute worsening in signs and symptoms.  Sagen was seen today for  follow-up.  Diagnoses and all orders for this visit:  Insomnia, unspecified type -     ALPRAZolam (XANAX) 0.25 MG tablet; Take 1-2 tabs po QHS prn insomnia and anxiety  Anxiety state -     ALPRAZolam (XANAX) 0.25 MG tablet; Take 1-2 tabs po QHS prn insomnia and anxiety     Please see After Visit Summary for patient specific instructions.  Future Appointments  Date Time Provider [place]  02/25/2020  8:30 AM Thayer Headings, PMHNP CP-CP None    No orders of the defined types were placed in this encounter.   -------------------------------

## 2019-08-30 NOTE — Progress Notes (Signed)
Patient Care Team: Vernie Shanks, MD as PCP - General (Family Medicine) Excell Seltzer, MD as Consulting Physician (General Surgery) Nicholas Lose, MD as Consulting Physician (Hematology and Oncology) Kyung Rudd, MD as Consulting Physician (Radiation Oncology)  DIAGNOSIS:    ICD-10-CM   1. Malignant neoplasm of upper-outer quadrant of left breast in female, estrogen receptor positive ([place])  C50.412    Z17.0     SUMMARY OF ONCOLOGIC HISTORY: Oncology History  Malignant neoplasm of upper-outer quadrant of left breast in female, estrogen receptor positive ([place])  06/19/2018 Initial Diagnosis   Screening detected left breast mass and distortion on further evaluation of mass went away and the distortion did not have a sonographic correlate.  Stereotactic biopsy revealed grade 2 IDC ER 95%, PR 90%, Ki-67 3%, HER-2 negative ratio 1.09 along with LCIS TX N0 stage Ia clinical stage    Genetic Testing   Negative genetic testing on the [facility] Breast Cancer STAT panel and Common Hereditary Cancers Panel. The STAT Breast cancer panel offered by [facility] includes sequencing and rearrangement analysis for the following 9 genes:  ATM, BRCA1, BRCA2, CDH1, CHEK2, PALB2, PTEN, STK11 and TP53.  The Common Hereditary Cancer Panel offered by [facility] includes sequencing and/or deletion duplication testing of the following 47 genes: APC, ATM, AXIN2, BARD1, BMPR1A, BRCA1, BRCA2, BRIP1, CDH1, CDKN2A (p14ARF), CDKN2A (p16INK4a), CKD4, CHEK2, CTNNA1, DICER1, EPCAM (Deletion/duplication testing only), GREM1 (promoter region deletion/duplication testing only), KIT, MEN1, MLH1, MSH2, MSH3, MSH6, MUTYH, NBN, NF1, NHTL1, PALB2, PDGFRA, PMS2, POLD1, POLE, PTEN, RAD50, RAD51C, RAD51D, SDHB, SDHC, SDHD, SMAD4, SMARCA4. STK11, TP53, TSC1, TSC2, and VHL.  The following genes were evaluated for sequence changes only: SDHA and HOXB13 c.251G>A variant only.  The report date is 07/21/2018.   08/01/2018 Surgery   Left  lumpectomy: IDC grade 1, 1.2 cm, margins negative, 0/1 lymph node negative ER 94%, PR 90%, HER-2 negative ratio 1.09, Ki-67 3%, T1CN0 stage Ia; Right lumpectomy: DCIS high-grade 1.4 cm, broadly less than 0.1 cm to posterior margin, ER 100%, PR 100%, Tis NX stage 0   08/08/2018 Oncotype testing   Oncotype DX score 19: Low risk 6% distant recurrence risk at 9 years   08/27/2018 Cancer Staging   Staging form: Breast, AJCC 8th Edition - Pathologic: Stage IA (pT1c, pN0, cM0, G1, ER+, PR+, HER2-, Oncotype DX score: 19) - Signed by Gardenia Phlegm, NP on 08/27/2018   10/21/2018 - 11/17/2018 Radiation Therapy   Adjuvant radiation therapy   01/24/2019 -  Anti-estrogen oral therapy   Letrozole daily     CHIEF COMPLIANT: Follow-up of left breast cancer on letrozole   INTERVAL HISTORY: Judith Mcneil is a 63 y.o. with above-mentioned history of left breast cancer treated with lumpectomy, radiation, and who is currently on anti-estrogen therapy with letrozole. I last saw her 10 months ago and in the interim she was seen by Wilber Bihari, NP for survivorship clinic. Bone density scan on 12/19/18 showed osteopenia with a T-score of -1.3. Bilateral mammogram on 06/19/19 showed no evidence of malignancy. She presents to the clinic today for follow-up.   REVIEW OF SYSTEMS:   Constitutional: Denies fevers, chills or abnormal weight loss Eyes: Denies blurriness of vision Ears, nose, mouth, throat, and face: Denies mucositis or sore throat Respiratory: Denies cough, dyspnea or wheezes Cardiovascular: Denies palpitation, chest discomfort Gastrointestinal: Denies nausea, heartburn or change in bowel habits Skin: Denies abnormal skin rashes Lymphatics: Denies new lymphadenopathy or easy bruising Neurological: Denies numbness, tingling or new weaknesses Behavioral/Psych: Mood  is stable, no new changes  Extremities: No lower extremity edema Breast: denies any pain or lumps or nodules in either breasts  All other systems were reviewed with the patient and are negative.  I have reviewed the past medical history, past surgical history, social history and family history with the patient and they are unchanged from previous note.  ALLERGIES:  has No Known Allergies.  MEDICATIONS:  Current Outpatient Medications  Medication Sig Dispense Refill  . ALPRAZolam (XANAX) 0.25 MG tablet Take 1-2 tabs po QHS prn insomnia and anxiety 135 tablet 0  . Calcium Citrate-Vitamin D (CALCIUM CITRATE + D3 PO) Take by mouth.    Marland Kitchen FLUoxetine (PROZAC) 10 MG capsule Take 1-2 capsules po qd 180 capsule 0  . ibuprofen (ADVIL,MOTRIN) 200 MG tablet Take 400 mg by mouth every 6 (six) hours as needed.    Marland Kitchen letrozole (FEMARA) 2.5 MG tablet Take 1 tablet (2.5 mg total) by mouth daily. 90 tablet 3  . loratadine (CLARITIN) 10 MG tablet Take 10 mg by mouth daily.    . Multiple Vitamin (MULTIVITAMIN) tablet Take 1 tablet by mouth daily.    . Omega-3 Fatty Acids (FISH OIL PO) Take by mouth.    . valACYclovir (VALTREX) 1000 MG tablet Take 1,000 mg by mouth daily as needed (outbreak).     No current facility-administered medications for this visit.     PHYSICAL EXAMINATION: ECOG PERFORMANCE STATUS: 1 - Symptomatic but completely ambulatory  Vitals:   09/01/19 0929  BP: 121/77  Pulse: 79  Resp: 18  Temp: 98.3 F (36.8 C)  SpO2: 98%   Filed Weights   09/01/19 0929  Weight: 178 lb 11.2 oz (81.1 kg)    GENERAL: alert, no distress and comfortable SKIN: skin color, texture, turgor are normal, no rashes or significant lesions EYES: normal, Conjunctiva are pink and non-injected, sclera clear OROPHARYNX: no exudate, no erythema and lips, buccal mucosa, and tongue normal  NECK: supple, thyroid normal size, non-tender, without nodularity LYMPH: no palpable lymphadenopathy in the cervical, axillary or inguinal LUNGS: clear to auscultation and percussion with normal breathing effort HEART: regular rate & rhythm and no  murmurs and no lower extremity edema ABDOMEN: abdomen soft, non-tender and normal bowel sounds MUSCULOSKELETAL: no cyanosis of digits and no clubbing  NEURO: alert & oriented x 3 with fluent speech, no focal motor/sensory deficits EXTREMITIES: No lower extremity edema   LABORATORY DATA:  I have reviewed the data as listed CMP Latest Ref Rng & Units 06/25/2018  Glucose 70 - 99 mg/dL 94  BUN 8 - 23 mg/dL 20  Creatinine 0.44 - 1.00 mg/dL 0.95  Sodium 135 - 145 mmol/L 139  Potassium 3.5 - 5.1 mmol/L 4.4  Chloride 98 - 111 mmol/L 108  CO2 22 - 32 mmol/L 26  Calcium 8.9 - 10.3 mg/dL 9.2  Total Protein 6.5 - 8.1 g/dL 7.0  Total Bilirubin 0.3 - 1.2 mg/dL 0.5  Alkaline Phos 38 - 126 U/L 90  AST 15 - 41 U/L 19  ALT 0 - 44 U/L 25    Lab Results  Component Value Date   WBC 6.9 06/25/2018   HGB 13.9 06/25/2018   HCT 41.7 06/25/2018   MCV 92.8 06/25/2018   PLT 262 06/25/2018   NEUTROABS 4.4 06/25/2018    ASSESSMENT & PLAN:  Malignant neoplasm of upper-outer quadrant of left breast in female, estrogen receptor positive ([facility]) 08/01/2018:Left lumpectomy: IDC grade 1, 1.2 cm, margins negative, 0/1 lymph node negative ER 94%, PR 90%,  HER-2 negative ratio 1.09, Ki-67 3%, T1CN0 stage Ia;  Right lumpectomy: DCIS high-grade 1.4 cm, broadly less than 0.1 cm to posterior margin, ER 100%, PR 100%, Tis NX stage 0 Oncotype DX score 19: Low risk 6% distant recurrence risk at 9 years  Adjuvant radiation therapy 10/21/2018 to 11/17/2018  Treatment plan: Adjuvant antiestrogen therapy with letrozole 2.5 mg daily x7 years started 12/07/2018  Letrozole toxicities: 1.  Hot flashes: I instructed the patient to take the medication in the morning and see if the hot flashes at the evening and night are better. We also discussed the triggers for hot flashes and she will keep an eye out for those.  Breast cancer surveillance: 1.  Mammogram 06/19/2019: Benign breast density category B 2.  Breast exam  Benign,  Dr. Marvel Plan her gynecologist has performed breast exams recently. 3.  Bone density showed osteopenia T score -1.3: Exercise and calcium and vitamin D are adequate.  Return to clinic in 1 year for surveillance and follow-up.    No orders of the defined types were placed in this encounter.  The patient has a good understanding of the overall plan. she agrees with it. she will call with any problems that may develop before the next visit here.  Nicholas Lose, MD 09/01/2019  Julious Oka Dorshimer am acting as scribe for Dr. Nicholas Lose.  I have reviewed the above documentation for accuracy and completeness, and I agree with the above.

## 2019-09-01 ENCOUNTER — Inpatient Hospital Stay: Payer: BLUE CROSS/BLUE SHIELD | Attending: Hematology and Oncology | Admitting: Hematology and Oncology

## 2019-09-01 ENCOUNTER — Other Ambulatory Visit: Payer: Self-pay

## 2019-09-01 DIAGNOSIS — Z923 Personal history of irradiation: Secondary | ICD-10-CM | POA: Insufficient documentation

## 2019-09-01 DIAGNOSIS — C50412 Malignant neoplasm of upper-outer quadrant of left female breast: Secondary | ICD-10-CM

## 2019-09-01 DIAGNOSIS — Z170 Estrogen receptor positive status [ER+]: Secondary | ICD-10-CM

## 2019-09-01 DIAGNOSIS — R232 Flushing: Secondary | ICD-10-CM | POA: Insufficient documentation

## 2019-09-01 DIAGNOSIS — M8580 Other specified disorders of bone density and structure, unspecified site: Secondary | ICD-10-CM | POA: Insufficient documentation

## 2019-09-01 DIAGNOSIS — Z79899 Other long term (current) drug therapy: Secondary | ICD-10-CM | POA: Diagnosis not present

## 2019-09-01 DIAGNOSIS — Z79811 Long term (current) use of aromatase inhibitors: Secondary | ICD-10-CM | POA: Diagnosis not present

## 2019-09-01 MED ORDER — LETROZOLE 2.5 MG PO TABS: 2.5000 mg | ORAL_TABLET | Freq: Every day | ORAL | 3 refills | Status: DC

## 2019-09-01 NOTE — Assessment & Plan Note (Signed)
08/01/2018:Left lumpectomy: IDC grade 1, 1.2 cm, margins negative, 0/1 lymph node negative ER 94%, PR 90%, HER-2 negative ratio 1.09, Ki-67 3%, T1CN0 stage Ia;  Right lumpectomy: DCIS high-grade 1.4 cm, broadly less than 0.1 cm to posterior margin, ER 100%, PR 100%, Tis NX stage 0 Oncotype DX score 19: Low risk 6% distant recurrence risk at 9 years  Adjuvant radiation therapy 10/21/2018 to 11/17/2018  Treatment plan: Adjuvant antiestrogen therapy with letrozole 2.5 mg daily x7 years to start 12/07/2018  Letrozole toxicities:  Breast cancer surveillance: 1.  Mammogram 06/19/2019: Benign breast density category B 2.  Breast exam 09/01/2019: Benign  Return to clinic in 1 year for surveillance and follow-up.

## 2019-09-02 ENCOUNTER — Telehealth: Payer: Self-pay | Admitting: Hematology and Oncology

## 2019-09-02 NOTE — Telephone Encounter (Signed)
I talk with patient regarding schedule  

## 2019-10-13 DIAGNOSIS — H40013 Open angle with borderline findings, low risk, bilateral: Secondary | ICD-10-CM | POA: Diagnosis not present

## 2019-10-22 ENCOUNTER — Other Ambulatory Visit: Payer: Self-pay | Admitting: Physician Assistant

## 2019-10-22 DIAGNOSIS — F411 Generalized anxiety disorder: Secondary | ICD-10-CM

## 2019-10-28 DIAGNOSIS — Z20828 Contact with and (suspected) exposure to other viral communicable diseases: Secondary | ICD-10-CM | POA: Diagnosis not present

## 2019-10-30 DIAGNOSIS — H00024 Hordeolum internum left upper eyelid: Secondary | ICD-10-CM | POA: Diagnosis not present

## 2019-11-03 DIAGNOSIS — H25043 Posterior subcapsular polar age-related cataract, bilateral: Secondary | ICD-10-CM | POA: Diagnosis not present

## 2019-11-03 DIAGNOSIS — H18413 Arcus senilis, bilateral: Secondary | ICD-10-CM | POA: Diagnosis not present

## 2019-11-03 DIAGNOSIS — H2511 Age-related nuclear cataract, right eye: Secondary | ICD-10-CM | POA: Diagnosis not present

## 2019-11-03 DIAGNOSIS — H25013 Cortical age-related cataract, bilateral: Secondary | ICD-10-CM | POA: Diagnosis not present

## 2019-11-03 DIAGNOSIS — H2513 Age-related nuclear cataract, bilateral: Secondary | ICD-10-CM | POA: Diagnosis not present

## 2019-11-23 DIAGNOSIS — D485 Neoplasm of uncertain behavior of skin: Secondary | ICD-10-CM | POA: Diagnosis not present

## 2019-11-23 DIAGNOSIS — H0014 Chalazion left upper eyelid: Secondary | ICD-10-CM | POA: Diagnosis not present

## 2019-12-01 DIAGNOSIS — D485 Neoplasm of uncertain behavior of skin: Secondary | ICD-10-CM | POA: Diagnosis not present

## 2019-12-01 DIAGNOSIS — L905 Scar conditions and fibrosis of skin: Secondary | ICD-10-CM | POA: Diagnosis not present

## 2019-12-16 DIAGNOSIS — H019 Unspecified inflammation of eyelid: Secondary | ICD-10-CM | POA: Diagnosis not present

## 2019-12-16 DIAGNOSIS — Z09 Encounter for follow-up examination after completed treatment for conditions other than malignant neoplasm: Secondary | ICD-10-CM | POA: Diagnosis not present

## 2019-12-16 DIAGNOSIS — D485 Neoplasm of uncertain behavior of skin: Secondary | ICD-10-CM | POA: Diagnosis not present

## 2019-12-28 DIAGNOSIS — H25812 Combined forms of age-related cataract, left eye: Secondary | ICD-10-CM | POA: Diagnosis not present

## 2019-12-28 DIAGNOSIS — H2512 Age-related nuclear cataract, left eye: Secondary | ICD-10-CM | POA: Diagnosis not present

## 2019-12-29 ENCOUNTER — Other Ambulatory Visit: Payer: Self-pay | Admitting: Psychiatry

## 2019-12-29 DIAGNOSIS — H25011 Cortical age-related cataract, right eye: Secondary | ICD-10-CM | POA: Diagnosis not present

## 2019-12-29 DIAGNOSIS — F411 Generalized anxiety disorder: Secondary | ICD-10-CM

## 2019-12-29 DIAGNOSIS — G4700 Insomnia, unspecified: Secondary | ICD-10-CM

## 2019-12-29 DIAGNOSIS — H25041 Posterior subcapsular polar age-related cataract, right eye: Secondary | ICD-10-CM | POA: Diagnosis not present

## 2019-12-29 DIAGNOSIS — H2511 Age-related nuclear cataract, right eye: Secondary | ICD-10-CM | POA: Diagnosis not present

## 2019-12-30 DIAGNOSIS — Z9189 Other specified personal risk factors, not elsewhere classified: Secondary | ICD-10-CM | POA: Diagnosis not present

## 2019-12-30 NOTE — Telephone Encounter (Signed)
Next apt 02/25/2020, 90 day refill

## 2020-01-18 DIAGNOSIS — H2511 Age-related nuclear cataract, right eye: Secondary | ICD-10-CM | POA: Diagnosis not present

## 2020-01-18 DIAGNOSIS — H25811 Combined forms of age-related cataract, right eye: Secondary | ICD-10-CM | POA: Diagnosis not present

## 2020-01-26 DIAGNOSIS — Z20828 Contact with and (suspected) exposure to other viral communicable diseases: Secondary | ICD-10-CM | POA: Diagnosis not present

## 2020-01-26 DIAGNOSIS — Z03818 Encounter for observation for suspected exposure to other biological agents ruled out: Secondary | ICD-10-CM | POA: Diagnosis not present

## 2020-02-03 DIAGNOSIS — Z23 Encounter for immunization: Secondary | ICD-10-CM | POA: Diagnosis not present

## 2020-02-24 DIAGNOSIS — Z23 Encounter for immunization: Secondary | ICD-10-CM | POA: Diagnosis not present

## 2020-02-25 ENCOUNTER — Encounter: Payer: Self-pay | Admitting: Psychiatry

## 2020-02-25 ENCOUNTER — Ambulatory Visit (INDEPENDENT_AMBULATORY_CARE_PROVIDER_SITE_OTHER): Payer: BLUE CROSS/BLUE SHIELD | Admitting: Psychiatry

## 2020-02-25 DIAGNOSIS — G4700 Insomnia, unspecified: Secondary | ICD-10-CM

## 2020-02-25 DIAGNOSIS — F411 Generalized anxiety disorder: Secondary | ICD-10-CM | POA: Diagnosis not present

## 2020-02-25 MED ORDER — FLUOXETINE HCL 10 MG PO CAPS: ORAL_CAPSULE | ORAL | 1 refills | Status: DC

## 2020-02-25 MED ORDER — ALPRAZOLAM 0.25 MG PO TABS: ORAL_TABLET | ORAL | 0 refills | Status: DC

## 2020-02-25 NOTE — Progress Notes (Signed)
MIRAI DRAIN SE:[id number] September 14, 1956 64 y.o.  Virtual Visit via Telephone Note  I connected with pt on 02/25/20 at  8:30 AM EST by telephone and verified that I am speaking with the correct person using two identifiers.   I discussed the limitations, risks, security and privacy concerns of performing an evaluation and management service by telephone and the availability of in person appointments. I also discussed with the patient that there may be a patient responsible charge related to this service. The patient expressed understanding and agreed to proceed.   I discussed the assessment and treatment plan with the patient. The patient was provided an opportunity to ask questions and all were answered. The patient agreed with the plan and demonstrated an understanding of the instructions.   The patient was advised to call back or seek an in-person evaluation if the symptoms worsen or if the condition fails to improve as anticipated.  I provided 20 minutes of non-face-to-face time during this encounter.  The patient was located at home.  The provider was located at [place].   Thayer Headings, PMHNP   Subjective:   Patient ID:  Judith Mcneil is a 64 y.o. (DOB 26-Mar-1956) female.  Chief Complaint:  Chief Complaint  Patient presents with  . Follow-up    Anxiety    HPI TIERANY TEPE presents for follow-up of anxiety. She reports that she tried to decrease Prozac to 10 mg for about 3-4 weeks and then increased back up to 20 mg po qd due to anxiety. She reports that anxiety is well controlled. She reports some affective dulling on 20 mg. Mood has been stable. Sleep has been improved. Now taking Letrozole in the morning instead of HS and this has helped with hot flashes. Reports that her appetite has been increased. Reports that her energy and motivation has been good. She has been able to concentrate without difficulty. Denies SI.   Has work meeting in July. Has  anxiety about going to this meeting about COVID exposure and travel.   Oldest daughter is pregnant and due the same week.   Had second COVID vaccination.   She reports that she is taking Xanax qhs to sleep  Past Psychiatric Medication Trials: Wellbutrin XL- Had increased anxiety at 300 mg  Celexa- Took for situational depression when husband was terminally ill. Effective.  Prozac-Effective Zoloft- Felt spacey Remeron- Felt "foggy the next day."  Xanax Klonopin- Helped with sleep duration but took longer to take effect.  Review of Systems:  Review of Systems  Gastrointestinal: Negative.   Musculoskeletal: Negative for gait problem.  Neurological: Negative for tremors and headaches.  Psychiatric/Behavioral:       Please refer to HPI    Medications: I have reviewed the patient's current medications.  Current Outpatient Medications  Medication Sig Dispense Refill  . [START ON 03/27/2020] ALPRAZolam (XANAX) 0.25 MG tablet TAKE 1 TO 2 TABLETS AT BEDTIME AS NEEDED FOR INSOMNIA AND ANXIETY 135 tablet 0  . Calcium Citrate-Vitamin D (CALCIUM CITRATE + D3 PO) Take by mouth.    Marland Kitchen FLUoxetine (PROZAC) 10 MG capsule TAKE 1-2 CAPSULES DAILY 180 capsule 1  . ibuprofen (ADVIL,MOTRIN) 200 MG tablet Take 400 mg by mouth every 6 (six) hours as needed.    Marland Kitchen letrozole (FEMARA) 2.5 MG tablet Take 1 tablet (2.5 mg total) by mouth daily. 90 tablet 3  . loratadine (CLARITIN) 10 MG tablet Take 10 mg by mouth daily.    . Multiple Vitamin (MULTIVITAMIN) tablet Take  1 tablet by mouth daily.    . Omega-3 Fatty Acids (FISH OIL PO) Take by mouth.    . valACYclovir (VALTREX) 1000 MG tablet Take 1,000 mg by mouth daily as needed (outbreak).     No current facility-administered medications for this visit.    Medication Side Effects: Other: Some affective dulling at 20 mg of Prozac.   Allergies: No Known Allergies  Past Medical History:  Diagnosis Date  . Anxiety   . Breast cancer ([place])   . Family  history of breast cancer   . Family history of colon cancer in father   . Medical history non-contributory   . Personal history of radiation therapy    Bilateral   . PONV (postoperative nausea and vomiting)     Family History  Problem Relation Age of Onset  . Alzheimer's disease Mother   . Colon cancer Father        dx mid 36s  . Breast cancer Paternal Grandmother        dx 73s  . Breast cancer Paternal Aunt        dx 72s  . Schizophrenia Daughter     Social History   Socioeconomic History  . Marital status: Widowed    Spouse name: Not on file  . Number of children: Not on file  . Years of education: Not on file  . Highest education level: Not on file  Occupational History  . Not on file  Tobacco Use  . Smoking status: Never Smoker  . Smokeless tobacco: Never Used  Substance and Sexual Activity  . Alcohol use: Yes    Alcohol/week: 1.0 standard drinks    Types: 1 Glasses of wine per week    Comment: few times a week  . Drug use: No  . Sexual activity: Not on file  Other Topics Concern  . Not on file  Social History Narrative   Pt lives alone, pets in the home    Social Determinants of Health   Financial Resource Strain:   . Difficulty of Paying Living Expenses: Not on file  Food Insecurity:   . Worried About Charity fundraiser in the Last Year: Not on file  . Ran Out of Food in the Last Year: Not on file  Transportation Needs:   . Lack of Transportation (Medical): Not on file  . Lack of Transportation (Non-Medical): Not on file  Physical Activity:   . Days of Exercise per Week: Not on file  . Minutes of Exercise per Session: Not on file  Stress:   . Feeling of Stress : Not on file  Social Connections:   . Frequency of Communication with Friends and Family: Not on file  . Frequency of Social Gatherings with Friends and Family: Not on file  . Attends Religious Services: Not on file  . Active Member of Clubs or Organizations: Not on file  . Attends English as a second language teacher Meetings: Not on file  . Marital Status: Not on file  Intimate Partner Violence:   . Fear of Current or Ex-Partner: Not on file  . Emotionally Abused: Not on file  . Physically Abused: Not on file  . Sexually Abused: Not on file    Past Medical History, Surgical history, Social history, and Family history were reviewed and updated as appropriate.   Please see review of systems for further details on the patient's review from today.   Objective:   Physical Exam:  Wt 182 lb (82.6 kg)  BMI 31.24 kg/m   Physical Exam Neurological:     Mental Status: She is alert and oriented to person, place, and time.     Cranial Nerves: No dysarthria.  Psychiatric:        Attention and Perception: Attention and perception normal.        Mood and Affect: Mood normal.        Speech: Speech normal.        Behavior: Behavior is cooperative.        Thought Content: Thought content normal. Thought content is not paranoid or delusional. Thought content does not include homicidal or suicidal ideation. Thought content does not include homicidal or suicidal plan.        Cognition and Memory: Cognition and memory normal.        Judgment: Judgment normal.     Comments: Insight intact     Lab Review:     Component Value Date/Time   NA 139 06/25/2018 1231   K 4.4 06/25/2018 1231   CL 108 06/25/2018 1231   CO2 26 06/25/2018 1231   GLUCOSE 94 06/25/2018 1231   BUN 20 06/25/2018 1231   CREATININE 0.95 06/25/2018 1231   CALCIUM 9.2 06/25/2018 1231   PROT 7.0 06/25/2018 1231   ALBUMIN 3.9 06/25/2018 1231   AST 19 06/25/2018 1231   ALT 25 06/25/2018 1231   ALKPHOS 90 06/25/2018 1231   BILITOT 0.5 06/25/2018 1231   GFRNONAA >60 06/25/2018 1231   GFRAA >60 06/25/2018 1231       Component Value Date/Time   WBC 6.9 06/25/2018 1231   WBC 8.8 08/11/2013 1620   RBC 4.49 06/25/2018 1231   HGB 13.9 06/25/2018 1231   HCT 41.7 06/25/2018 1231   PLT 262 06/25/2018 1231   MCV 92.8  06/25/2018 1231   MCH 30.9 06/25/2018 1231   MCHC 33.3 06/25/2018 1231   RDW 13.5 06/25/2018 1231   LYMPHSABS 1.9 06/25/2018 1231   MONOABS 0.4 06/25/2018 1231   EOSABS 0.1 06/25/2018 1231   BASOSABS 0.0 06/25/2018 1231    No results found for: POCLITH, LITHIUM   No results found for: PHENYTOIN, PHENOBARB, VALPROATE, CBMZ   .res Assessment: Plan:   Discussed that pt could take Prozac 20 mg q other day, alternating with 10 mg q other day since 10 mg does not adequately control anxiety s/s and 20 mg causes some affective dulling.  Continue Xanax for anxiety and insomnia. Discussed that pt has upcoming meeting out of state that is causing some anxiety and may need a letter to excuse her from meeting.  Pt to f/u in 6 months or sooner if clinically indicated.  Patient advised to contact office with any questions, adverse effects, or acute worsening in signs and symptoms.  Geet was seen today for follow-up.  Diagnoses and all orders for this visit:  Anxiety state -     FLUoxetine (PROZAC) 10 MG capsule; TAKE 1-2 CAPSULES DAILY -     ALPRAZolam (XANAX) 0.25 MG tablet; TAKE 1 TO 2 TABLETS AT BEDTIME AS NEEDED FOR INSOMNIA AND ANXIETY  Insomnia, unspecified type -     ALPRAZolam (XANAX) 0.25 MG tablet; TAKE 1 TO 2 TABLETS AT BEDTIME AS NEEDED FOR INSOMNIA AND ANXIETY    Please see After Visit Summary for patient specific instructions.  Future Appointments  Date Time Provider [place]  08/31/2020  8:45 AM Nicholas Lose, MD [facility] None    No orders of the defined types were placed in this encounter.     -------------------------------

## 2020-03-07 DIAGNOSIS — H02884 Meibomian gland dysfunction left upper eyelid: Secondary | ICD-10-CM | POA: Diagnosis not present

## 2020-03-07 DIAGNOSIS — H02885 Meibomian gland dysfunction left lower eyelid: Secondary | ICD-10-CM | POA: Diagnosis not present

## 2020-03-07 DIAGNOSIS — H02882 Meibomian gland dysfunction right lower eyelid: Secondary | ICD-10-CM | POA: Diagnosis not present

## 2020-03-07 DIAGNOSIS — H02881 Meibomian gland dysfunction right upper eyelid: Secondary | ICD-10-CM | POA: Diagnosis not present

## 2020-04-12 DIAGNOSIS — H40013 Open angle with borderline findings, low risk, bilateral: Secondary | ICD-10-CM | POA: Diagnosis not present

## 2020-05-29 DIAGNOSIS — J22 Unspecified acute lower respiratory infection: Secondary | ICD-10-CM | POA: Diagnosis not present

## 2020-05-29 DIAGNOSIS — J019 Acute sinusitis, unspecified: Secondary | ICD-10-CM | POA: Diagnosis not present

## 2020-05-30 ENCOUNTER — Other Ambulatory Visit: Payer: Self-pay | Admitting: Obstetrics and Gynecology

## 2020-05-30 DIAGNOSIS — Z853 Personal history of malignant neoplasm of breast: Secondary | ICD-10-CM

## 2020-06-07 DIAGNOSIS — Z03818 Encounter for observation for suspected exposure to other biological agents ruled out: Secondary | ICD-10-CM | POA: Diagnosis not present

## 2020-06-07 DIAGNOSIS — J0100 Acute maxillary sinusitis, unspecified: Secondary | ICD-10-CM | POA: Diagnosis not present

## 2020-06-07 DIAGNOSIS — Z20822 Contact with and (suspected) exposure to covid-19: Secondary | ICD-10-CM | POA: Diagnosis not present

## 2020-06-16 ENCOUNTER — Other Ambulatory Visit: Payer: Self-pay | Admitting: Adult Health

## 2020-06-16 DIAGNOSIS — Z853 Personal history of malignant neoplasm of breast: Secondary | ICD-10-CM

## 2020-06-29 ENCOUNTER — Other Ambulatory Visit: Payer: Self-pay | Admitting: Obstetrics and Gynecology

## 2020-06-29 DIAGNOSIS — Z853 Personal history of malignant neoplasm of breast: Secondary | ICD-10-CM

## 2020-07-06 ENCOUNTER — Other Ambulatory Visit: Payer: Self-pay | Admitting: Psychiatry

## 2020-07-06 DIAGNOSIS — F411 Generalized anxiety disorder: Secondary | ICD-10-CM

## 2020-07-06 DIAGNOSIS — G4700 Insomnia, unspecified: Secondary | ICD-10-CM

## 2020-07-07 NOTE — Telephone Encounter (Signed)
Last apt in March due back 6 months, please send for mail order

## 2020-07-15 ENCOUNTER — Other Ambulatory Visit: Payer: Self-pay

## 2020-07-15 ENCOUNTER — Ambulatory Visit
Admission: RE | Admit: 2020-07-15 | Discharge: 2020-07-15 | Disposition: A | Discharge status: Home / Self Care | Payer: BLUE CROSS/BLUE SHIELD | Source: Ambulatory Visit | Attending: Obstetrics and Gynecology | Admitting: Obstetrics and Gynecology

## 2020-07-15 DIAGNOSIS — R928 Other abnormal and inconclusive findings on diagnostic imaging of breast: Secondary | ICD-10-CM | POA: Diagnosis not present

## 2020-07-15 DIAGNOSIS — Z853 Personal history of malignant neoplasm of breast: Secondary | ICD-10-CM

## 2020-08-04 DIAGNOSIS — M25551 Pain in right hip: Secondary | ICD-10-CM | POA: Diagnosis not present

## 2020-08-04 DIAGNOSIS — Z0000 Encounter for general adult medical examination without abnormal findings: Secondary | ICD-10-CM | POA: Diagnosis not present

## 2020-08-04 DIAGNOSIS — M25552 Pain in left hip: Secondary | ICD-10-CM | POA: Diagnosis not present

## 2020-08-04 DIAGNOSIS — E559 Vitamin D deficiency, unspecified: Secondary | ICD-10-CM | POA: Diagnosis not present

## 2020-08-04 DIAGNOSIS — E7800 Pure hypercholesterolemia, unspecified: Secondary | ICD-10-CM | POA: Diagnosis not present

## 2020-08-04 DIAGNOSIS — Z1159 Encounter for screening for other viral diseases: Secondary | ICD-10-CM | POA: Diagnosis not present

## 2020-08-04 DIAGNOSIS — Z23 Encounter for immunization: Secondary | ICD-10-CM | POA: Diagnosis not present

## 2020-08-16 ENCOUNTER — Other Ambulatory Visit: Payer: BLUE CROSS/BLUE SHIELD

## 2020-08-16 ENCOUNTER — Other Ambulatory Visit: Payer: Self-pay

## 2020-08-16 DIAGNOSIS — J219 Acute bronchiolitis, unspecified: Secondary | ICD-10-CM | POA: Diagnosis not present

## 2020-08-16 DIAGNOSIS — Z20822 Contact with and (suspected) exposure to covid-19: Secondary | ICD-10-CM | POA: Diagnosis not present

## 2020-08-16 DIAGNOSIS — J209 Acute bronchitis, unspecified: Secondary | ICD-10-CM | POA: Diagnosis not present

## 2020-08-16 DIAGNOSIS — R062 Wheezing: Secondary | ICD-10-CM | POA: Diagnosis not present

## 2020-08-17 LAB — NOVEL CORONAVIRUS, NAA: SARS-CoV-2, NAA: NOT DETECTED

## 2020-08-17 LAB — SARS-COV-2, NAA 2 DAY TAT

## 2020-08-18 DIAGNOSIS — R062 Wheezing: Secondary | ICD-10-CM | POA: Diagnosis not present

## 2020-08-18 DIAGNOSIS — J209 Acute bronchitis, unspecified: Secondary | ICD-10-CM | POA: Diagnosis not present

## 2020-08-18 DIAGNOSIS — J219 Acute bronchiolitis, unspecified: Secondary | ICD-10-CM | POA: Diagnosis not present

## 2020-08-24 DIAGNOSIS — Z6829 Body mass index (BMI) 29.0-29.9, adult: Secondary | ICD-10-CM | POA: Diagnosis not present

## 2020-08-24 DIAGNOSIS — Z1389 Encounter for screening for other disorder: Secondary | ICD-10-CM | POA: Diagnosis not present

## 2020-08-24 DIAGNOSIS — B009 Herpesviral infection, unspecified: Secondary | ICD-10-CM | POA: Diagnosis not present

## 2020-08-24 DIAGNOSIS — Z01419 Encounter for gynecological examination (general) (routine) without abnormal findings: Secondary | ICD-10-CM | POA: Diagnosis not present

## 2020-08-30 NOTE — Progress Notes (Signed)
Patient Care Team: Vernie Shanks, MD as PCP - General (Family Medicine) Excell Seltzer, MD (Inactive) as Consulting Physician (General Surgery) Nicholas Lose, MD as Consulting Physician (Hematology and Oncology) Kyung Rudd, MD as Consulting Physician (Radiation Oncology)  DIAGNOSIS:    ICD-10-CM   1. Osteopenia of hip, unspecified laterality  M85.859 DG Bone Density  2. Malignant neoplasm of upper-outer quadrant of left breast in female, estrogen receptor positive ([place])  C50.412    Z17.0     SUMMARY OF ONCOLOGIC HISTORY: Oncology History  Malignant neoplasm of upper-outer quadrant of left breast in female, estrogen receptor positive ([place])  06/19/2018 Initial Diagnosis   Screening detected left breast mass and distortion on further evaluation of mass went away and the distortion did not have a sonographic correlate.  Stereotactic biopsy revealed grade 2 IDC ER 95%, PR 90%, Ki-67 3%, HER-2 negative ratio 1.09 along with LCIS TX N0 stage Ia clinical stage    Genetic Testing   Negative genetic testing on the [facility] Breast Cancer STAT panel and Common Hereditary Cancers Panel. The STAT Breast cancer panel offered by [facility] includes sequencing and rearrangement analysis for the following 9 genes:  ATM, BRCA1, BRCA2, CDH1, CHEK2, PALB2, PTEN, STK11 and TP53.  The Common Hereditary Cancer Panel offered by [facility] includes sequencing and/or deletion duplication testing of the following 47 genes: APC, ATM, AXIN2, BARD1, BMPR1A, BRCA1, BRCA2, BRIP1, CDH1, CDKN2A (p14ARF), CDKN2A (p16INK4a), CKD4, CHEK2, CTNNA1, DICER1, EPCAM (Deletion/duplication testing only), GREM1 (promoter region deletion/duplication testing only), KIT, MEN1, MLH1, MSH2, MSH3, MSH6, MUTYH, NBN, NF1, NHTL1, PALB2, PDGFRA, PMS2, POLD1, POLE, PTEN, RAD50, RAD51C, RAD51D, SDHB, SDHC, SDHD, SMAD4, SMARCA4. STK11, TP53, TSC1, TSC2, and VHL.  The following genes were evaluated for sequence changes only: SDHA and HOXB13 c.251G>A  variant only.  The report date is 07/21/2018.   08/01/2018 Surgery   Left lumpectomy: IDC grade 1, 1.2 cm, margins negative, 0/1 lymph node negative ER 94%, PR 90%, HER-2 negative ratio 1.09, Ki-67 3%, T1CN0 stage Ia; Right lumpectomy: DCIS high-grade 1.4 cm, broadly less than 0.1 cm to posterior margin, ER 100%, PR 100%, Tis NX stage 0   08/08/2018 Oncotype testing   Oncotype DX score 19: Low risk 6% distant recurrence risk at 9 years   08/27/2018 Cancer Staging   Staging form: Breast, AJCC 8th Edition - Pathologic: Stage IA (pT1c, pN0, cM0, G1, ER+, PR+, HER2-, Oncotype DX score: 19) - Signed by Gardenia Phlegm, NP on 08/27/2018   10/21/2018 - 11/17/2018 Radiation Therapy   Adjuvant radiation therapy   01/24/2019 -  Anti-estrogen oral therapy   Letrozole daily     CHIEF COMPLIANT: Follow-up of left breast cancer on letrozole   INTERVAL HISTORY: Judith Mcneil is a 64 y.o. with above-mentioned history of left breast cancer treated with lumpectomy, radiation, and who is currently on anti-estrogen therapy with letrozole. Bilateral mammogram on 07/15/20 showed no evidence of malignancy. She presents to the clinic today for follow-up.    ALLERGIES:  has No Known Allergies.  MEDICATIONS:  Current Outpatient Medications  Medication Sig Dispense Refill  . ALPRAZolam (XANAX) 0.25 MG tablet TAKE 1 TO 2 TABLETS AT BEDTIME AS NEEDED FOR INSOMNIA AND ANXIETY 135 tablet 0  . Calcium Citrate-Vitamin D (CALCIUM CITRATE + D3 PO) Take by mouth.    Marland Kitchen FLUoxetine (PROZAC) 10 MG capsule TAKE 1-2 CAPSULES DAILY 180 capsule 1  . ibuprofen (ADVIL,MOTRIN) 200 MG tablet Take 400 mg by mouth every 6 (six) hours as needed.    Marland Kitchen  letrozole (FEMARA) 2.5 MG tablet Take 1 tablet (2.5 mg total) by mouth daily. 90 tablet 3  . loratadine (CLARITIN) 10 MG tablet Take 10 mg by mouth daily.    . Multiple Vitamin (MULTIVITAMIN) tablet Take 1 tablet by mouth daily.    . Omega-3 Fatty Acids (FISH OIL PO) Take by  mouth.    . valACYclovir (VALTREX) 1000 MG tablet Take 1,000 mg by mouth daily as needed (outbreak).     No current facility-administered medications for this visit.    PHYSICAL EXAMINATION: ECOG PERFORMANCE STATUS: 1 - Symptomatic but completely ambulatory  Vitals:   08/31/20 0839  BP: 130/80  Pulse: 85  Resp: 18  Temp: (!) 97.4 F (36.3 C)  SpO2: 98%   Filed Weights   08/31/20 0839  Weight: 173 lb (78.5 kg)    BREAST: No palpable masses or nodules in either right or left breasts. No palpable axillary supraclavicular or infraclavicular adenopathy no breast tenderness or nipple discharge. (exam performed in the presence of a chaperone)  LABORATORY DATA:  I have reviewed the data as listed CMP Latest Ref Rng & Units 06/25/2018  Glucose 70 - 99 mg/dL 94  BUN 8 - 23 mg/dL 20  Creatinine 0.44 - 1.00 mg/dL 0.95  Sodium 135 - 145 mmol/L 139  Potassium 3.5 - 5.1 mmol/L 4.4  Chloride 98 - 111 mmol/L 108  CO2 22 - 32 mmol/L 26  Calcium 8.9 - 10.3 mg/dL 9.2  Total Protein 6.5 - 8.1 g/dL 7.0  Total Bilirubin 0.3 - 1.2 mg/dL 0.5  Alkaline Phos 38 - 126 U/L 90  AST 15 - 41 U/L 19  ALT 0 - 44 U/L 25    Lab Results  Component Value Date   WBC 6.9 06/25/2018   HGB 13.9 06/25/2018   HCT 41.7 06/25/2018   MCV 92.8 06/25/2018   PLT 262 06/25/2018   NEUTROABS 4.4 06/25/2018    ASSESSMENT & PLAN:  Malignant neoplasm of upper-outer quadrant of left breast in female, estrogen receptor positive ([facility]) 08/01/2018:Left lumpectomy: IDC grade 1, 1.2 cm, margins negative, 0/1 lymph node negative ER 94%, PR 90%, HER-2 negative ratio 1.09, Ki-67 3%, T1CN0 stage Ia;  Right lumpectomy: DCIS high-grade 1.4 cm, broadly less than 0.1 cm to posterior margin, ER 100%, PR 100%, Tis NX stage 0 Oncotype DX score 19: Low risk 6% distant recurrence risk at 9 years  Adjuvant radiation therapy 10/21/2018 to 11/17/2018  Treatment plan: Adjuvant antiestrogen therapy with letrozole 2.5 mg daily x7 years  started 12/07/2018  Letrozole toxicities: 1.  Hot flashes: Slowly improving  Breast cancer surveillance: 1.  Mammogram 07/15/2020: Benign breast density category B 2.  Breast exam 08/31/2020 Benign,  3.  Bone density showed osteopenia T score -1.3: Exercise and calcium and vitamin D are adequate.  Return to clinic in 1 year for surveillance and follow-up.    Orders Placed This Encounter  Procedures  . DG Bone Density    Standing Status:   Future    Standing Expiration Date:   08/31/2021    Order Specific Question:   Reason for Exam (SYMPTOM  OR DIAGNOSIS REQUIRED)    Answer:   Osteopenia evaluation    Order Specific Question:   Preferred imaging location?    Answer:   [facility]    Order Specific Question:   Release to patient    Answer:   Immediate   The patient has a good understanding of the overall plan. she agrees with it. she will  call with any problems that may develop before the next visit here.  Total time spent: 20 mins including face to face time and time spent for planning, charting and coordination of care  Nicholas Lose, MD 08/31/2020  I, Cloyde Reams Dorshimer, am acting as scribe for Dr. Nicholas Lose.  I have reviewed the above documentation for accuracy and completeness, and I agree with the above.

## 2020-08-31 ENCOUNTER — Inpatient Hospital Stay: Payer: BLUE CROSS/BLUE SHIELD | Attending: Hematology and Oncology | Admitting: Hematology and Oncology

## 2020-08-31 ENCOUNTER — Other Ambulatory Visit: Payer: Self-pay

## 2020-08-31 VITALS — BP 130/80 | HR 85 | Temp 97.4°F | Resp 18 | Ht 64.0 in | Wt 173.0 lb

## 2020-08-31 DIAGNOSIS — C50412 Malignant neoplasm of upper-outer quadrant of left female breast: Secondary | ICD-10-CM | POA: Insufficient documentation

## 2020-08-31 DIAGNOSIS — Z923 Personal history of irradiation: Secondary | ICD-10-CM | POA: Insufficient documentation

## 2020-08-31 DIAGNOSIS — Z79899 Other long term (current) drug therapy: Secondary | ICD-10-CM | POA: Diagnosis not present

## 2020-08-31 DIAGNOSIS — M85859 Other specified disorders of bone density and structure, unspecified thigh: Secondary | ICD-10-CM | POA: Diagnosis not present

## 2020-08-31 DIAGNOSIS — Z170 Estrogen receptor positive status [ER+]: Secondary | ICD-10-CM | POA: Insufficient documentation

## 2020-08-31 DIAGNOSIS — R232 Flushing: Secondary | ICD-10-CM | POA: Insufficient documentation

## 2020-08-31 NOTE — Assessment & Plan Note (Signed)
08/01/2018:Left lumpectomy: IDC grade 1, 1.2 cm, margins negative, 0/1 lymph node negative ER 94%, PR 90%, HER-2 negative ratio 1.09, Ki-67 3%, T1CN0 stage Ia;  Right lumpectomy: DCIS high-grade 1.4 cm, broadly less than 0.1 cm to posterior margin, ER 100%, PR 100%, Tis NX stage 0 Oncotype DX score 19: Low risk 6% distant recurrence risk at 9 years  Adjuvant radiation therapy 10/21/2018 to 11/17/2018  Treatment plan: Adjuvant antiestrogen therapy with letrozole 2.5 mg daily x7 years started 12/07/2018  Letrozole toxicities: 1.  Hot flashes: I instructed the patient to take the medication in the morning and see if the hot flashes at the evening and night are better. We also discussed the triggers for hot flashes and she will keep an eye out for those.  Breast cancer surveillance: 1.  Mammogram 07/15/2020: Benign breast density category B 2.  Breast exam 08/31/2020 Benign,  3.  Bone density showed osteopenia T score -1.3: Exercise and calcium and vitamin D are adequate.  Return to clinic in 1 year for surveillance and follow-up.

## 2020-09-01 ENCOUNTER — Telehealth: Payer: Self-pay | Admitting: Hematology and Oncology

## 2020-09-01 NOTE — Telephone Encounter (Signed)
Scheduled per 9/8 los. Called and left a msg, mailing appt letter and calendar printout

## 2020-09-20 ENCOUNTER — Other Ambulatory Visit: Payer: Self-pay | Admitting: Family Medicine

## 2020-09-20 ENCOUNTER — Ambulatory Visit
Admission: RE | Admit: 2020-09-20 | Discharge: 2020-09-20 | Disposition: A | Discharge status: Home / Self Care | Payer: BLUE CROSS/BLUE SHIELD | Source: Ambulatory Visit | Attending: Family Medicine | Admitting: Family Medicine

## 2020-09-20 ENCOUNTER — Other Ambulatory Visit: Payer: Self-pay

## 2020-09-20 DIAGNOSIS — M25551 Pain in right hip: Secondary | ICD-10-CM

## 2020-09-20 DIAGNOSIS — M25552 Pain in left hip: Secondary | ICD-10-CM | POA: Diagnosis not present

## 2020-11-02 ENCOUNTER — Other Ambulatory Visit: Payer: Self-pay | Admitting: Hematology and Oncology

## 2020-11-02 ENCOUNTER — Other Ambulatory Visit: Payer: Self-pay | Admitting: Psychiatry

## 2020-11-02 DIAGNOSIS — G4700 Insomnia, unspecified: Secondary | ICD-10-CM

## 2020-11-02 DIAGNOSIS — F411 Generalized anxiety disorder: Secondary | ICD-10-CM

## 2020-11-03 NOTE — Telephone Encounter (Signed)
Last apt 02/2020 due back 6 months

## 2020-12-26 ENCOUNTER — Telehealth: Payer: Self-pay | Admitting: Psychiatry

## 2020-12-26 ENCOUNTER — Other Ambulatory Visit: Payer: Self-pay | Admitting: Psychiatry

## 2020-12-26 DIAGNOSIS — F411 Generalized anxiety disorder: Secondary | ICD-10-CM

## 2020-12-26 MED ORDER — FLUOXETINE HCL 10 MG PO CAPS: ORAL_CAPSULE | ORAL | 0 refills | Status: DC

## 2020-12-26 NOTE — Telephone Encounter (Signed)
Script sent to [facility] on [address] for 90-day supply.

## 2020-12-26 NOTE — Telephone Encounter (Signed)
Received response from pharmacy that insurance will only cover a 30-day supply of Prozac.  Prescription revised to 30-day supply.

## 2020-12-26 NOTE — Telephone Encounter (Signed)
Judith Mcneil called to request refill of her Fluoxetine.  She normally gets it from [facility] but doesn't have enough to wait for the mail order.  She would like it sent in to [facility] at [address].  Requested 90 day. Last seen 03/13/20.  Did make appt. For tomorrow, 12/27/20

## 2020-12-27 ENCOUNTER — Telehealth (INDEPENDENT_AMBULATORY_CARE_PROVIDER_SITE_OTHER): Payer: BC Managed Care – PPO | Admitting: Psychiatry

## 2020-12-27 ENCOUNTER — Telehealth: Payer: Self-pay | Admitting: Psychiatry

## 2020-12-27 ENCOUNTER — Encounter: Payer: Self-pay | Admitting: Psychiatry

## 2020-12-27 DIAGNOSIS — R52 Pain, unspecified: Secondary | ICD-10-CM | POA: Diagnosis not present

## 2020-12-27 DIAGNOSIS — Z20822 Contact with and (suspected) exposure to covid-19: Secondary | ICD-10-CM | POA: Diagnosis not present

## 2020-12-27 DIAGNOSIS — F411 Generalized anxiety disorder: Secondary | ICD-10-CM | POA: Diagnosis not present

## 2020-12-27 DIAGNOSIS — R0981 Nasal congestion: Secondary | ICD-10-CM | POA: Diagnosis not present

## 2020-12-27 MED ORDER — FLUOXETINE HCL 10 MG PO CAPS: ORAL_CAPSULE | ORAL | 1 refills | Status: DC

## 2020-12-27 NOTE — Telephone Encounter (Signed)
Judith Mcneil, Judith Mcneil are scheduled for a virtual visit with your provider today.    Just as we do with appointments in the office, we must obtain your consent to participate.  Your consent will be active for this visit and any virtual visit you may have with one of our providers in the next 365 days.    If you have a MyChart account, I can also send a copy of this consent to you electronically.  All virtual visits are billed to your insurance company just like a traditional visit in the office.  As this is a virtual visit, video technology does not allow for your provider to perform a traditional examination.  This may limit your provider's ability to fully assess your condition.  If your provider identifies any concerns that need to be evaluated in person or the need to arrange testing such as labs, EKG, etc, we will make arrangements to do so.    Although advances in technology are sophisticated, we cannot ensure that it will always work on either your end or our end.  If the connection with a video visit is poor, we may have to switch to a telephone visit.  With either a video or telephone visit, we are not always able to ensure that we have a secure connection.   I need to obtain your verbal consent now.   Are you willing to proceed with your visit today?   Judith Mcneil has provided verbal consent on 12/27/2020 for a virtual visit (video or telephone).   Corie Chiquito, PMHNP 12/27/2020  9:06 AM

## 2020-12-27 NOTE — Progress Notes (Signed)
Judith Mcneil [id number] 07/04/1956 65 y.o.  Virtual Visit via Telephone Note  I connected with pt on 12/27/20 at  9:00 AM EST by telephone and verified that I am speaking with the correct person using two identifiers.   I discussed the limitations, risks, security and privacy concerns of performing an evaluation and management service by telephone and the availability of in person appointments. I also discussed with the patient that there may be a patient responsible charge related to this service. The patient expressed understanding and agreed to proceed.   I discussed the assessment and treatment plan with the patient. The patient was provided an opportunity to ask questions and all were answered. The patient agreed with the plan and demonstrated an understanding of the instructions.   The patient was advised to call back or seek an in-person evaluation if the symptoms worsen or if the condition fails to improve as anticipated.  I provided 30 minutes of non-face-to-face time during this encounter.  The patient was located at home.  The provider was located at [place].   Thayer Headings, PMHNP   Subjective:   Patient ID:  Judith Mcneil is a 65 y.o. (DOB [birth year]) female.  Chief Complaint:  Chief Complaint  Patient presents with  . Follow-up    Anxiety    HPI Judith Mcneil presents for follow-up of anxiety. She is supposed to have a Warden/ranger in [place] next week. She has some anxiety and anger about possibly having to attend large meeting. She reports that overall anxiety has been well controlled. Sleep has been ok and sleep is occasionally interrupted by back pain. She reports that her energy and motivation have been ok. She plans to cut back on some of her volunteer activities. She reports that concentration has been adequate. Appetite has been good. Denies SI.   She has had multiple family members have had [place].   She has been taking  Prozac 20 mg every other day, alternating with 10 mg every other day. She reports that Prozac 10 mg has been not adequate. She reports that she has had affective dulling with 20 mg daily.    She reports that the holidays tend to be difficult for her and her family since daughter with schizophrenia tends to have   Joining family and patient advisory board for new behavioral health assessment center.   Has been taking Xanax prn a couple times a week, typically at night.   Past Psychiatric Medication Trials: Wellbutrin XL- Had increased anxiety at 300 mg  Celexa- Took for situational depression when husband was terminally ill. Effective.  Prozac-Effective Zoloft- Felt spacey Remeron- Felt "foggy the next day."  Xanax Klonopin- Helped with sleep duration but took longer to take effect.  Review of Systems:  Review of Systems  Gastrointestinal: Negative.   Musculoskeletal: Positive for back pain. Negative for gait problem.  Neurological: Negative for tremors and headaches.  Psychiatric/Behavioral:       Please refer to HPI    Medications: I have reviewed the patient's current medications.  Current Outpatient Medications  Medication Sig Dispense Refill  . ALPRAZolam (XANAX) 0.25 MG tablet TAKE 1 TO 2 TABLETS AT BEDTIME AS NEEDED FOR INSOMNIA AND ANXIETY 135 tablet 0  . Calcium Citrate-Vitamin D (CALCIUM CITRATE + D3 PO) Take by mouth.    Marland Kitchen FLUoxetine (PROZAC) 10 MG capsule Take 1 capsule every other day alternating with 2 capsules every other day 180 capsule 1  . ibuprofen (ADVIL,MOTRIN) 200  MG tablet Take 400 mg by mouth every 6 (six) hours as needed.    Marland Kitchen letrozole (FEMARA) 2.5 MG tablet TAKE 1 TABLET DAILY 90 tablet 3  . loratadine (CLARITIN) 10 MG tablet Take 10 mg by mouth daily.    . Multiple Vitamin (MULTIVITAMIN) tablet Take 1 tablet by mouth daily.    . Omega-3 Fatty Acids (FISH OIL PO) Take by mouth.    . rosuvastatin (CRESTOR) 20 MG tablet Take by mouth. Taking 3 days a  week    . valACYclovir (VALTREX) 1000 MG tablet Take 1,000 mg by mouth daily as needed (outbreak).     No current facility-administered medications for this visit.    Medication Side Effects: None  Allergies: No Known Allergies  Past Medical History:  Diagnosis Date  . Anxiety   . Breast cancer (HCC)   . Elevated cholesterol   . Family history of breast cancer   . Family history of colon cancer in father   . Medical history non-contributory   . Personal history of radiation therapy    Bilateral   . PONV (postoperative nausea and vomiting)     Family History  Problem Relation Age of Onset  . Alzheimer's disease Mother   . Colon cancer Father        dx mid 34s  . Breast cancer Paternal Grandmother        dx 82s  . Breast cancer Paternal Aunt        dx 86s  . Schizophrenia Daughter     Social History   Socioeconomic History  . Marital status: Widowed    Spouse name: Not on file  . Number of children: Not on file  . Years of education: Not on file  . Highest education level: Not on file  Occupational History  . Not on file  Tobacco Use  . Smoking status: Never Smoker  . Smokeless tobacco: Never Used  Vaping Use  . Vaping Use: Never used  Substance and Sexual Activity  . Alcohol use: Yes    Alcohol/week: 1.0 standard drink    Types: 1 Glasses of wine per week    Comment: few times a week  . Drug use: No  . Sexual activity: Not on file  Other Topics Concern  . Not on file  Social History Narrative   Pt lives alone, pets in the home    Social Determinants of Health   Financial Resource Strain: Not on file  Food Insecurity: Not on file  Transportation Needs: Not on file  Physical Activity: Not on file  Stress: Not on file  Social Connections: Not on file  Intimate Partner Violence: Not on file    Past Medical History, Surgical history, Social history, and Family history were reviewed and updated as appropriate.   Please see review of systems for  further details on the patient's review from today.   Objective:   Physical Exam:  There were no vitals taken for this visit.  Physical Exam Neurological:     Mental Status: She is alert and oriented to person, place, and time.     Cranial Nerves: No dysarthria.  Psychiatric:        Attention and Perception: Attention and perception normal.        Mood and Affect: Mood normal.        Speech: Speech normal.        Behavior: Behavior is cooperative.        Thought Content: Thought content normal.  Thought content is not paranoid or delusional. Thought content does not include homicidal or suicidal ideation. Thought content does not include homicidal or suicidal plan.        Cognition and Memory: Cognition and memory normal.        Judgment: Judgment normal.     Comments: Insight intact     Lab Review:     Component Value Date/Time   NA 139 06/25/2018 1231   K 4.4 06/25/2018 1231   CL 108 06/25/2018 1231   CO2 26 06/25/2018 1231   GLUCOSE 94 06/25/2018 1231   BUN 20 06/25/2018 1231   CREATININE 0.95 06/25/2018 1231   CALCIUM 9.2 06/25/2018 1231   PROT 7.0 06/25/2018 1231   ALBUMIN 3.9 06/25/2018 1231   AST 19 06/25/2018 1231   ALT 25 06/25/2018 1231   ALKPHOS 90 06/25/2018 1231   BILITOT 0.5 06/25/2018 1231   GFRNONAA >60 06/25/2018 1231   GFRAA >60 06/25/2018 1231       Component Value Date/Time   WBC 6.9 06/25/2018 1231   WBC 8.8 08/11/2013 1620   RBC 4.49 06/25/2018 1231   HGB 13.9 06/25/2018 1231   HCT 41.7 06/25/2018 1231   PLT 262 06/25/2018 1231   MCV 92.8 06/25/2018 1231   MCH 30.9 06/25/2018 1231   MCHC 33.3 06/25/2018 1231   RDW 13.5 06/25/2018 1231   LYMPHSABS 1.9 06/25/2018 1231   MONOABS 0.4 06/25/2018 1231   EOSABS 0.1 06/25/2018 1231   BASOSABS 0.0 06/25/2018 1231    No results found for: POCLITH, LITHIUM   No results found for: PHENYTOIN, PHENOBARB, VALPROATE, CBMZ   .res Assessment: Plan:   Will continue Prozac 20 mg po every other day  alternating with 10 mg po every other day for anxiety.  Continue Alprazolam 0.25 mg 1-2 tabs po QHS prn insomnia. She reports that she does not need a refill at this time.  Pt to follow-up in 6 months or sooner if clinically indicated.  Patient advised to contact office with any questions, adverse effects, or acute worsening in signs and symptoms.   Concettina was seen today for follow-up.  Diagnoses and all orders for this visit:  Anxiety state -     FLUoxetine (PROZAC) 10 MG capsule; Take 1 capsule every other day alternating with 2 capsules every other day    Please see After Visit Summary for patient specific instructions.  Future Appointments  Date Time Provider [place]  02/16/2021  7:30 AM [facility] DX DEXA 1 [facility]  08/31/2021  9:00 AM Nicholas Lose, MD [facility] None    No orders of the defined types were placed in this encounter.     -------------------------------

## 2020-12-28 ENCOUNTER — Other Ambulatory Visit: Payer: BLUE CROSS/BLUE SHIELD

## 2021-01-13 ENCOUNTER — Other Ambulatory Visit: Payer: BC Managed Care – PPO

## 2021-01-13 ENCOUNTER — Other Ambulatory Visit: Payer: Self-pay

## 2021-01-13 DIAGNOSIS — Z20822 Contact with and (suspected) exposure to covid-19: Secondary | ICD-10-CM | POA: Diagnosis not present

## 2021-01-15 LAB — SPECIMEN STATUS REPORT

## 2021-01-15 LAB — NOVEL CORONAVIRUS, NAA: SARS-CoV-2, NAA: NOT DETECTED

## 2021-01-15 LAB — SARS-COV-2, NAA 2 DAY TAT

## 2021-01-18 ENCOUNTER — Other Ambulatory Visit: Payer: Self-pay | Admitting: Psychiatry

## 2021-01-18 DIAGNOSIS — F411 Generalized anxiety disorder: Secondary | ICD-10-CM

## 2021-02-16 ENCOUNTER — Other Ambulatory Visit: Payer: BLUE CROSS/BLUE SHIELD

## 2021-02-17 ENCOUNTER — Ambulatory Visit
Admission: RE | Admit: 2021-02-17 | Discharge: 2021-02-17 | Disposition: A | Discharge status: Home / Self Care | Payer: BC Managed Care – PPO | Source: Ambulatory Visit | Attending: Hematology and Oncology | Admitting: Hematology and Oncology

## 2021-02-17 ENCOUNTER — Other Ambulatory Visit: Payer: Self-pay

## 2021-02-17 DIAGNOSIS — M85852 Other specified disorders of bone density and structure, left thigh: Secondary | ICD-10-CM | POA: Diagnosis not present

## 2021-02-17 DIAGNOSIS — M85832 Other specified disorders of bone density and structure, left forearm: Secondary | ICD-10-CM | POA: Diagnosis not present

## 2021-02-17 DIAGNOSIS — M85859 Other specified disorders of bone density and structure, unspecified thigh: Secondary | ICD-10-CM

## 2021-02-17 DIAGNOSIS — Z780 Asymptomatic menopausal state: Secondary | ICD-10-CM | POA: Diagnosis not present

## 2021-03-17 IMAGING — CR DG HIP (WITH OR WITHOUT PELVIS) 3-4V BILAT
5 series · 5 of 5 positions shown · non-contrast
Comparison: Plain films right hip 07/20/2006.

CLINICAL DATA: Bilateral hip pain for 2 months.  No known injury.

EXAM:
DG HIP (WITH OR WITHOUT PELVIS) 3-4V BILAT

[w pelvis upright]
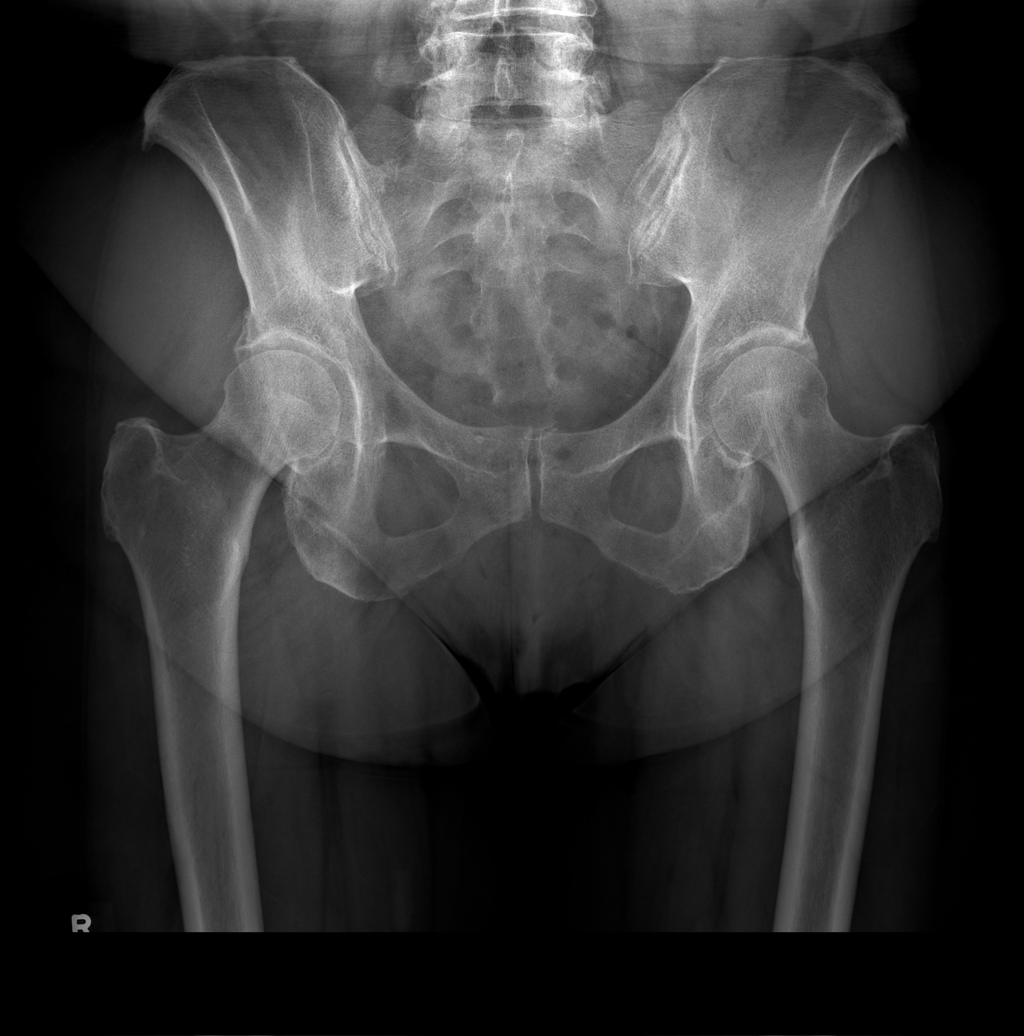

[w hip ap left]
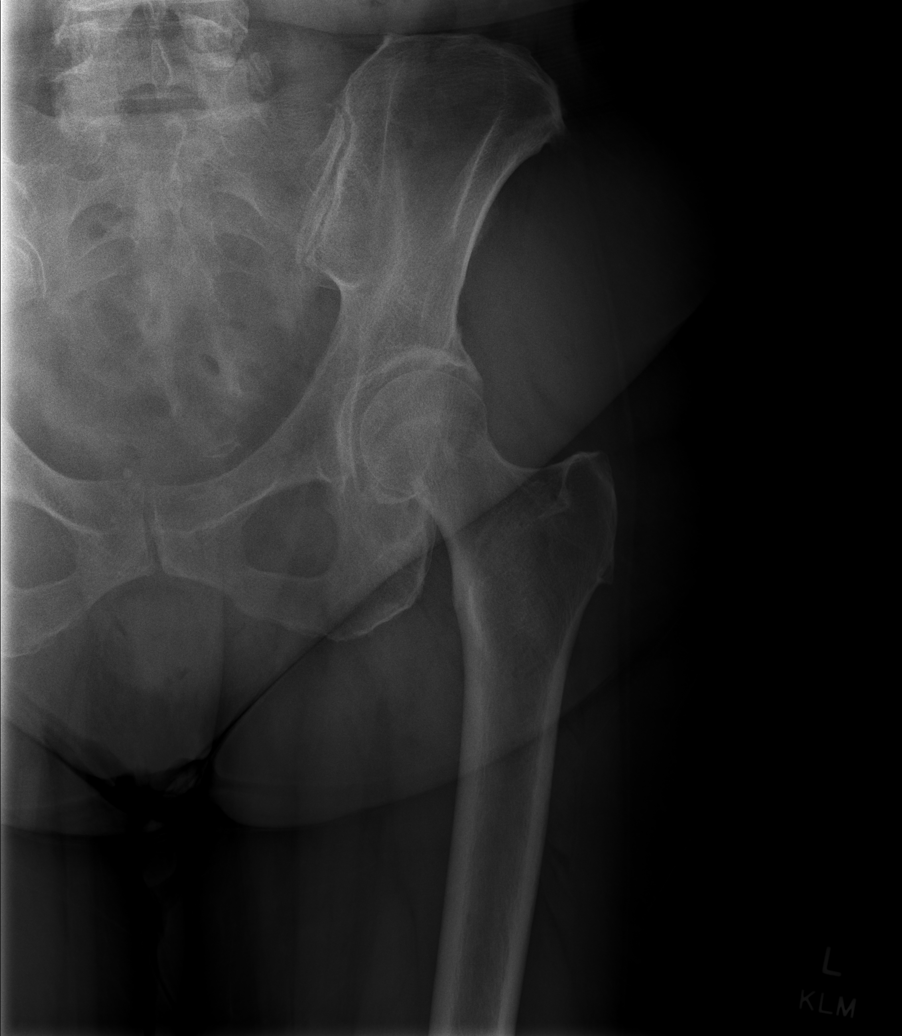

[w hip frog left]
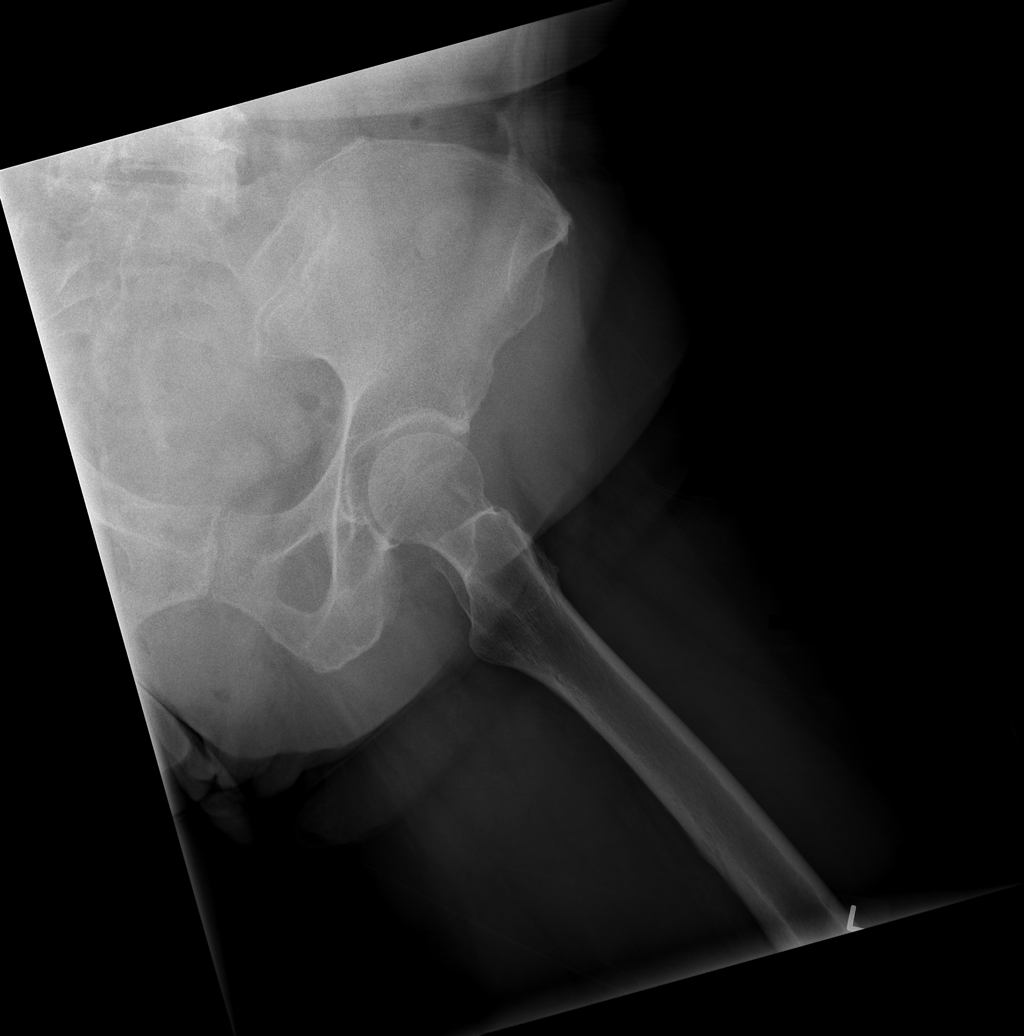

[w hip ap right]
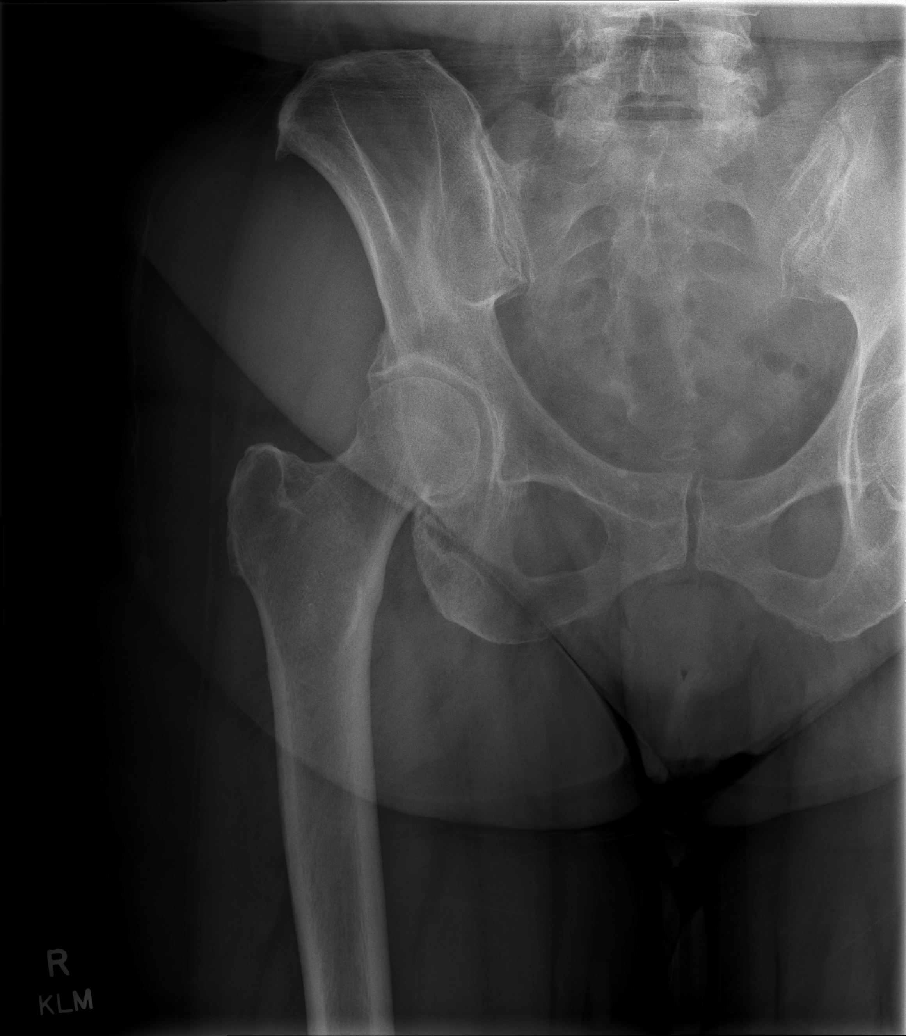

[w hip frog right]
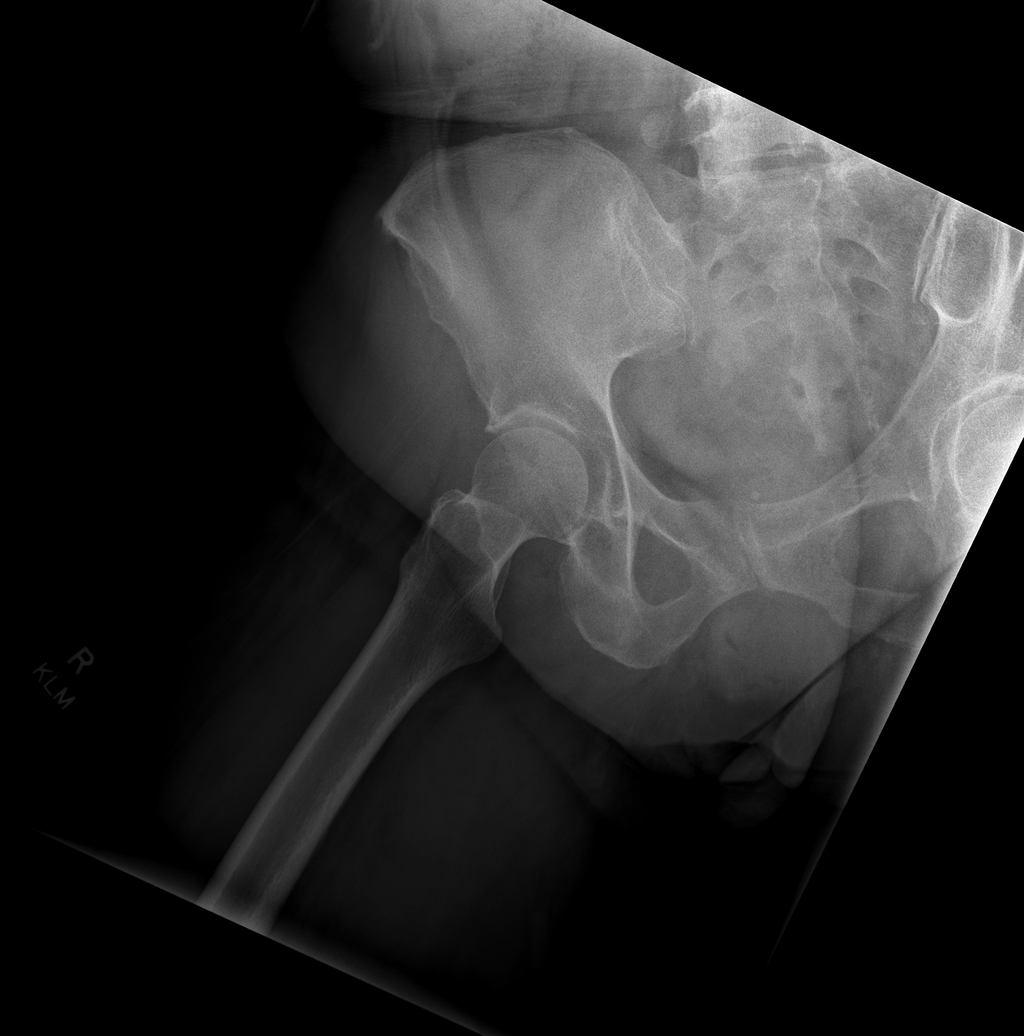

[5 of 5 positions shown; findings below may reference images not displayed]

FINDINGS: There is no evidence of hip fracture or dislocation. There is no
evidence of arthropathy or other focal bone abnormality.
IMPRESSION: Negative exam.

## 2021-04-05 ENCOUNTER — Other Ambulatory Visit: Payer: Self-pay | Admitting: Psychiatry

## 2021-04-05 DIAGNOSIS — G4700 Insomnia, unspecified: Secondary | ICD-10-CM

## 2021-04-05 DIAGNOSIS — F411 Generalized anxiety disorder: Secondary | ICD-10-CM

## 2021-04-05 NOTE — Telephone Encounter (Signed)
controlled substance

## 2021-07-22 ENCOUNTER — Other Ambulatory Visit: Payer: Self-pay | Admitting: Psychiatry

## 2021-07-22 DIAGNOSIS — G4700 Insomnia, unspecified: Secondary | ICD-10-CM

## 2021-07-22 DIAGNOSIS — F411 Generalized anxiety disorder: Secondary | ICD-10-CM

## 2021-07-24 NOTE — Telephone Encounter (Signed)
Overdue for 6 month f/up    ADM: please schedule apt

## 2021-07-28 NOTE — Telephone Encounter (Signed)
Left message for pt to call and schedule

## 2021-07-31 ENCOUNTER — Telehealth: Payer: Self-pay | Admitting: Psychiatry

## 2021-07-31 ENCOUNTER — Other Ambulatory Visit: Payer: Self-pay

## 2021-07-31 MED ORDER — ALPRAZOLAM 0.25 MG PO TABS: ORAL_TABLET | ORAL | 0 refills | Status: DC

## 2021-07-31 NOTE — Telephone Encounter (Signed)
Pt was not sure if the 90 day was sent to [facility] or not. Informed her it was sent on 08/02 and [facility] filled it 07/27/2021. Informed her 1-2 week supply can be sent to [facility] in case it's delayed. She said she would appreciate that just in case.   I can pend that for you to send.

## 2021-07-31 NOTE — Telephone Encounter (Signed)
Next visit is 08/24/21. Requesting refill on Xanax called to:  [id number] Phone:  [phone] Fax:  [phone]

## 2021-08-02 DIAGNOSIS — J0100 Acute maxillary sinusitis, unspecified: Secondary | ICD-10-CM | POA: Diagnosis not present

## 2021-08-04 DIAGNOSIS — Z20822 Contact with and (suspected) exposure to covid-19: Secondary | ICD-10-CM | POA: Diagnosis not present

## 2021-08-11 ENCOUNTER — Other Ambulatory Visit: Payer: Self-pay | Admitting: Obstetrics and Gynecology

## 2021-08-11 DIAGNOSIS — Z9889 Other specified postprocedural states: Secondary | ICD-10-CM

## 2021-08-17 ENCOUNTER — Other Ambulatory Visit (HOSPITAL_BASED_OUTPATIENT_CLINIC_OR_DEPARTMENT_OTHER): Payer: Self-pay

## 2021-08-22 ENCOUNTER — Other Ambulatory Visit: Payer: Self-pay | Admitting: Obstetrics and Gynecology

## 2021-08-22 DIAGNOSIS — Z853 Personal history of malignant neoplasm of breast: Secondary | ICD-10-CM

## 2021-08-24 ENCOUNTER — Encounter: Payer: Self-pay | Admitting: Psychiatry

## 2021-08-24 ENCOUNTER — Other Ambulatory Visit: Payer: Self-pay

## 2021-08-24 ENCOUNTER — Ambulatory Visit: Payer: BC Managed Care – PPO | Admitting: Psychiatry

## 2021-08-24 DIAGNOSIS — G4700 Insomnia, unspecified: Secondary | ICD-10-CM | POA: Diagnosis not present

## 2021-08-24 DIAGNOSIS — F411 Generalized anxiety disorder: Secondary | ICD-10-CM

## 2021-08-24 MED ORDER — ALPRAZOLAM 0.25 MG PO TABS: ORAL_TABLET | ORAL | 1 refills | Status: DC

## 2021-08-24 MED ORDER — FLUOXETINE HCL 10 MG PO CAPS: ORAL_CAPSULE | ORAL | 1 refills | Status: DC

## 2021-08-24 NOTE — Progress Notes (Signed)
Judith Mcneil [id number] June 16, 1956 65 y.o.  Subjective:   Patient ID:  Judith Mcneil is a 65 y.o. (DOB Nov 13, 1956) female.  Chief Complaint:  Chief Complaint  Patient presents with   Follow-up    Anxiety, insomnia    HPI Judith Mcneil presents to the office today for follow-up of anxiety. 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 Had meeting in June out of state and did ok. She reports that she has less anxiety around [place]. She reports that she takes Xanax 0.25 mg po QHS to help with sleep initiation. She reports that she has not needed Xanax prn during the day. She reports that her anxiety has been ok overall. She reports that she stays busy with work, family, and [employer] and this helps with anxiety. Denies any panic s/s. Denies depressed mood. Motivation has been fair. Energy has been ok. Estimates sleeping about 8 hours a night. She reports that she has been eating more sweets and craving sweets. "The more sweets I have the more sweets I want." Concentration has been good. Denies SI.   Granddaughter turned one in July. Daughter that lives with her and works at an ALF. Daughter receives services through program at [facility]. Daughter has been stable 2.5 years on Clozaril and LAI Haldol.   She is considering retirement in the next 1-2 years. She reports that she would remain active with volunteer.   Works remotely as a Web designer.    Past Psychiatric Medication Trials: Wellbutrin XL- Had increased anxiety at 300 mg  Celexa- Took for situational depression when husband was terminally ill. Effective.  Prozac-Effective Zoloft- Felt spacey Remeron- Felt "foggy the next day."  Xanax Klonopin- Helped with sleep duration but took longer to take effect.     Review of Systems:  Review of Systems  Gastrointestinal: Negative.   Musculoskeletal:  Negative for gait problem.  Neurological:  Negative for tremors and headaches.  Psychiatric/Behavioral:          Please refer to HPI  Has repeat mammogram tomorrow.    Medications: I have reviewed the patient's current medications.  Current Outpatient Medications  Medication Sig Dispense Refill   Calcium Citrate-Vitamin D (CALCIUM CITRATE + D3 PO) Take by mouth.     FLUoxetine (PROZAC) 10 MG capsule Take 1-2 capsules daily 180 capsule 1   ibuprofen (ADVIL,MOTRIN) 200 MG tablet Take 400 mg by mouth every 6 (six) hours as needed.     letrozole (FEMARA) 2.5 MG tablet TAKE 1 TABLET DAILY 90 tablet 3   loratadine (CLARITIN) 10 MG tablet Take 10 mg by mouth daily.     Multiple Vitamin (MULTIVITAMIN) tablet Take 1 tablet by mouth daily.     Omega-3 Fatty Acids (FISH OIL PO) Take by mouth.     rosuvastatin (CRESTOR) 20 MG tablet Take by mouth. Taking 3 days a week     valACYclovir (VALTREX) 1000 MG tablet Take 1,000 mg by mouth daily as needed (outbreak).     ALPRAZolam (XANAX) 0.25 MG tablet Take 1-2 tablets by mouth at bedtime as needed for insomnia and anxiety. 14 tablet 0   [START ON 10/19/2021] ALPRAZolam (XANAX) 0.25 MG tablet TAKE 1 TO 2 TABLETS AT BEDTIME AS NEEDED FOR INSOMNIA AND ANXIETY 135 tablet 1   No current facility-administered medications for this visit.    Medication Side Effects: None  Allergies: No Known Allergies  Past Medical History:  Diagnosis Date   Anxiety    Breast cancer (  [facility])    Elevated cholesterol    Family history of breast cancer    Family history of colon cancer in father    Medical history non-contributory    Personal history of radiation therapy    Bilateral    PONV (postoperative nausea and vomiting)     Past Medical History, Surgical history, Social history, and Family history were reviewed and updated as appropriate.   Please see review of systems for further details on the patient's review from today.   Objective:   Physical Exam:  There were no vitals taken for this visit.  Physical Exam Constitutional:      General: She is not in acute  distress. Musculoskeletal:        General: No deformity.  Neurological:     Mental Status: She is alert and oriented to person, place, and time.     Coordination: Coordination normal.  Psychiatric:        Attention and Perception: Attention and perception normal. She does not perceive auditory or visual hallucinations.        Mood and Affect: Mood normal. Mood is not anxious or depressed. Affect is not labile, blunt, angry or inappropriate.        Speech: Speech normal.        Behavior: Behavior normal.        Thought Content: Thought content normal. Thought content is not paranoid or delusional. Thought content does not include homicidal or suicidal ideation. Thought content does not include homicidal or suicidal plan.        Cognition and Memory: Cognition and memory normal.        Judgment: Judgment normal.     Comments: Insight intact    Lab Review:     Component Value Date/Time   NA 139 06/25/2018 1231   K 4.4 06/25/2018 1231   CL 108 06/25/2018 1231   CO2 26 06/25/2018 1231   GLUCOSE 94 06/25/2018 1231   BUN 20 06/25/2018 1231   CREATININE 0.95 06/25/2018 1231   CALCIUM 9.2 06/25/2018 1231   PROT 7.0 06/25/2018 1231   ALBUMIN 3.9 06/25/2018 1231   AST 19 06/25/2018 1231   ALT 25 06/25/2018 1231   ALKPHOS 90 06/25/2018 1231   BILITOT 0.5 06/25/2018 1231   GFRNONAA >60 06/25/2018 1231   GFRAA >60 06/25/2018 1231       Component Value Date/Time   WBC 6.9 06/25/2018 1231   WBC 8.8 08/11/2013 1620   RBC 4.49 06/25/2018 1231   HGB 13.9 06/25/2018 1231   HCT 41.7 06/25/2018 1231   PLT 262 06/25/2018 1231   MCV 92.8 06/25/2018 1231   MCH 30.9 06/25/2018 1231   MCHC 33.3 06/25/2018 1231   RDW 13.5 06/25/2018 1231   LYMPHSABS 1.9 06/25/2018 1231   MONOABS 0.4 06/25/2018 1231   EOSABS 0.1 06/25/2018 1231   BASOSABS 0.0 06/25/2018 1231    No results found for: POCLITH, LITHIUM   No results found for: PHENYTOIN, PHENOBARB, VALPROATE, CBMZ   .res Assessment: Plan:    Will continue current plan of care since target signs and symptoms are well controlled without any tolerability issues.  Continue Prozac 10 mg every other day alternating with 20 mg every other day.  Continue Xanax 0.25 mg 1-2 tabs po QHS prn insomnia and anxiety.  Pt to follow-up in 6 months or sooner if clinically indicated.  Patient advised to contact office with any questions, adverse effects, or acute worsening in signs and symptoms.   Stanton Kidney  was seen today for follow-up.  Diagnoses and all orders for this visit:  Anxiety state -     FLUoxetine (PROZAC) 10 MG capsule; Take 1-2 capsules daily -     ALPRAZolam (XANAX) 0.25 MG tablet; TAKE 1 TO 2 TABLETS AT BEDTIME AS NEEDED FOR INSOMNIA AND ANXIETY  Insomnia, unspecified type -     ALPRAZolam (XANAX) 0.25 MG tablet; TAKE 1 TO 2 TABLETS AT BEDTIME AS NEEDED FOR INSOMNIA AND ANXIETY    Please see After Visit Summary for patient specific instructions.  Future Appointments  Date Time Provider [place]  08/31/2021  9:00 AM Nicholas Lose, MD [facility] None  02/23/2022  9:00 AM Thayer Headings, PMHNP [facility] None    No orders of the defined types were placed in this encounter.   -------------------------------

## 2021-08-25 ENCOUNTER — Ambulatory Visit
Admission: RE | Admit: 2021-08-25 | Discharge: 2021-08-25 | Disposition: A | Discharge status: Home / Self Care | Payer: BC Managed Care – PPO | Source: Ambulatory Visit | Attending: Obstetrics and Gynecology | Admitting: Obstetrics and Gynecology

## 2021-08-25 DIAGNOSIS — R922 Inconclusive mammogram: Secondary | ICD-10-CM | POA: Diagnosis not present

## 2021-08-25 DIAGNOSIS — Z853 Personal history of malignant neoplasm of breast: Secondary | ICD-10-CM

## 2021-08-30 NOTE — Progress Notes (Signed)
Patient Care Team: Vernie Shanks, MD as PCP - General (Family Medicine) Excell Seltzer, MD (Inactive) as Consulting Physician (General Surgery) Nicholas Lose, MD as Consulting Physician (Hematology and Oncology) Kyung Rudd, MD as Consulting Physician (Radiation Oncology)  DIAGNOSIS:    ICD-10-CM   1. Malignant neoplasm of upper-outer quadrant of left breast in female, estrogen receptor positive ([facility])  C50.412    Z17.0       SUMMARY OF ONCOLOGIC HISTORY: Oncology History  Malignant neoplasm of upper-outer quadrant of left breast in female, estrogen receptor positive ([facility])  06/19/2018 Initial Diagnosis   Screening detected left breast mass and distortion on further evaluation of mass went away and the distortion did not have a sonographic correlate.  Stereotactic biopsy revealed grade 2 IDC ER 95%, PR 90%, Ki-67 3%, HER-2 negative ratio 1.09 along with LCIS TX N0 stage Ia clinical stage    Genetic Testing   Negative genetic testing on the [facility] Breast Cancer STAT panel and Common Hereditary Cancers Panel. The STAT Breast cancer panel offered by [facility] includes sequencing and rearrangement analysis for the following 9 genes:  ATM, BRCA1, BRCA2, CDH1, CHEK2, PALB2, PTEN, STK11 and TP53.  The Common Hereditary Cancer Panel offered by [facility] includes sequencing and/or deletion duplication testing of the following 47 genes: APC, ATM, AXIN2, BARD1, BMPR1A, BRCA1, BRCA2, BRIP1, CDH1, CDKN2A (p14ARF), CDKN2A (p16INK4a), CKD4, CHEK2, CTNNA1, DICER1, EPCAM (Deletion/duplication testing only), GREM1 (promoter region deletion/duplication testing only), KIT, MEN1, MLH1, MSH2, MSH3, MSH6, MUTYH, NBN, NF1, NHTL1, PALB2, PDGFRA, PMS2, POLD1, POLE, PTEN, RAD50, RAD51C, RAD51D, SDHB, SDHC, SDHD, SMAD4, SMARCA4. STK11, TP53, TSC1, TSC2, and VHL.  The following genes were evaluated for sequence changes only: SDHA and HOXB13 c.251G>A variant only.  The report date is 07/21/2018.   08/01/2018 Surgery    Left lumpectomy: IDC grade 1, 1.2 cm, margins negative, 0/1 lymph node negative ER 94%, PR 90%, HER-2 negative ratio 1.09, Ki-67 3%, T1CN0 stage Ia; Right lumpectomy: DCIS high-grade 1.4 cm, broadly less than 0.1 cm to posterior margin, ER 100%, PR 100%, Tis NX stage 0   08/08/2018 Oncotype testing   Oncotype DX score 19: Low risk 6% distant recurrence risk at 9 years   08/27/2018 Cancer Staging   Staging form: Breast, AJCC 8th Edition - Pathologic: Stage IA (pT1c, pN0, cM0, G1, ER+, PR+, HER2-, Oncotype DX score: 19) - Signed by Gardenia Phlegm, NP on 08/27/2018   10/21/2018 - 11/17/2018 Radiation Therapy   Adjuvant radiation therapy   01/24/2019 -  Anti-estrogen oral therapy   Letrozole daily     CHIEF COMPLIANT: Follow-up of left breast cancer on letrozole   INTERVAL HISTORY: Judith Mcneil is a 65 y.o. with above-mentioned history of left breast cancer treated with lumpectomy, radiation, and who is currently on anti-estrogen therapy with letrozole. Bilateral mammogram on 08/25/2021 showed no evidence of malignancy. She presents to the clinic today for follow-up.  She is tolerating letrozole extremely well without any major problems or concerns.  ALLERGIES:  has No Known Allergies.  MEDICATIONS:  Current Outpatient Medications  Medication Sig Dispense Refill   ALPRAZolam (XANAX) 0.25 MG tablet Take 1-2 tablets by mouth at bedtime as needed for insomnia and anxiety. 14 tablet 0   [START ON 10/19/2021] ALPRAZolam (XANAX) 0.25 MG tablet TAKE 1 TO 2 TABLETS AT BEDTIME AS NEEDED FOR INSOMNIA AND ANXIETY 135 tablet 1   Calcium Citrate-Vitamin D (CALCIUM CITRATE + D3 PO) Take by mouth.     FLUoxetine (PROZAC) 10 MG capsule  Take 1-2 capsules daily 180 capsule 1   ibuprofen (ADVIL,MOTRIN) 200 MG tablet Take 400 mg by mouth every 6 (six) hours as needed.     letrozole (FEMARA) 2.5 MG tablet TAKE 1 TABLET DAILY 90 tablet 3   loratadine (CLARITIN) 10 MG tablet Take 10 mg by mouth daily.      Multiple Vitamin (MULTIVITAMIN) tablet Take 1 tablet by mouth daily.     Omega-3 Fatty Acids (FISH OIL PO) Take by mouth.     rosuvastatin (CRESTOR) 20 MG tablet Take by mouth. Taking 3 days a week     valACYclovir (VALTREX) 1000 MG tablet Take 1,000 mg by mouth daily as needed (outbreak).     No current facility-administered medications for this visit.    PHYSICAL EXAMINATION: ECOG PERFORMANCE STATUS: 1 - Symptomatic but completely ambulatory  There were no vitals filed for this visit. There were no vitals filed for this visit.  BREAST: No palpable masses or nodules in either right or left breasts. No palpable axillary supraclavicular or infraclavicular adenopathy no breast tenderness or nipple discharge. (exam performed in the presence of a chaperone)  LABORATORY DATA:  I have reviewed the data as listed CMP Latest Ref Rng & Units 06/25/2018  Glucose 70 - 99 mg/dL 94  BUN 8 - 23 mg/dL 20  Creatinine 0.44 - 1.00 mg/dL 0.95  Sodium 135 - 145 mmol/L 139  Potassium 3.5 - 5.1 mmol/L 4.4  Chloride 98 - 111 mmol/L 108  CO2 22 - 32 mmol/L 26  Calcium 8.9 - 10.3 mg/dL 9.2  Total Protein 6.5 - 8.1 g/dL 7.0  Total Bilirubin 0.3 - 1.2 mg/dL 0.5  Alkaline Phos 38 - 126 U/L 90  AST 15 - 41 U/L 19  ALT 0 - 44 U/L 25    Lab Results  Component Value Date   WBC 6.9 06/25/2018   HGB 13.9 06/25/2018   HCT 41.7 06/25/2018   MCV 92.8 06/25/2018   PLT 262 06/25/2018   NEUTROABS 4.4 06/25/2018    ASSESSMENT & PLAN:  Malignant neoplasm of upper-outer quadrant of left breast in female, estrogen receptor positive (HCC) 08/01/2018:Left lumpectomy: IDC grade 1, 1.2 cm, margins negative, 0/1 lymph node negative ER 94%, PR 90%, HER-2 negative ratio 1.09, Ki-67 3%, T1CN0 stage Ia;  Right lumpectomy: DCIS high-grade 1.4 cm, broadly less than 0.1 cm to posterior margin, ER 100%, PR 100%, Tis NX stage 0 Oncotype DX score 19: Low risk 6% distant recurrence risk at 9 years   Adjuvant radiation therapy  10/21/2018 to 11/17/2018   Treatment plan: Adjuvant antiestrogen therapy with letrozole 2.5 mg daily x7 years started 12/07/2018   Letrozole toxicities: 1.  Hot flashes: Slowly improving   Breast cancer surveillance: 1.  Mammogram  08/25/2021: Benign breast density category B 2.  Bone density showed osteopenia T score -2.1: Exercise and calcium and vitamin D.  I recommended starting her on Fosamax.  She is willing to take it.  I discussed the pros and cons of Fosamax including the risk of esophageal irritation as well as risk of osteonecrosis of the jaw.  She does not have any dental issues.   Return to clinic in 1 year for surveillance and follow-up.    No orders of the defined types were placed in this encounter.  The patient has a good understanding of the overall plan. she agrees with it. she will call with any problems that may develop before the next visit here.  Total time spent: 20 mins including  face to face time and time spent for planning, charting and coordination of care  Rulon Eisenmenger, MD, MPH 08/31/2021  I, Thana Ates, am acting as scribe for Dr. Nicholas Lose.  I have reviewed the above documentation for accuracy and completeness, and I agree with the above.

## 2021-08-31 ENCOUNTER — Inpatient Hospital Stay: Payer: BC Managed Care – PPO | Attending: Hematology and Oncology | Admitting: Hematology and Oncology

## 2021-08-31 ENCOUNTER — Other Ambulatory Visit: Payer: Self-pay

## 2021-08-31 DIAGNOSIS — Z923 Personal history of irradiation: Secondary | ICD-10-CM | POA: Insufficient documentation

## 2021-08-31 DIAGNOSIS — C50412 Malignant neoplasm of upper-outer quadrant of left female breast: Secondary | ICD-10-CM | POA: Diagnosis not present

## 2021-08-31 DIAGNOSIS — Z170 Estrogen receptor positive status [ER+]: Secondary | ICD-10-CM | POA: Diagnosis not present

## 2021-08-31 DIAGNOSIS — Z79899 Other long term (current) drug therapy: Secondary | ICD-10-CM | POA: Diagnosis not present

## 2021-08-31 DIAGNOSIS — R232 Flushing: Secondary | ICD-10-CM | POA: Insufficient documentation

## 2021-08-31 DIAGNOSIS — Z79811 Long term (current) use of aromatase inhibitors: Secondary | ICD-10-CM | POA: Insufficient documentation

## 2021-08-31 MED ORDER — ALENDRONATE SODIUM 70 MG PO TABS: 70.0000 mg | ORAL_TABLET | ORAL | 3 refills | Status: DC

## 2021-08-31 NOTE — Assessment & Plan Note (Signed)
08/01/2018:Left lumpectomy: IDC grade 1, 1.2 cm, margins negative, 0/1 lymph node negative ER 94%, PR 90%, HER-2 negative ratio 1.09, Ki-67 3%, T1CN0 stage Ia;  Right lumpectomy: DCIS high-grade 1.4 cm, broadly less than 0.1 cm to posterior margin, ER 100%, PR 100%, Tis NX stage 0 Oncotype DX score 19: Low risk 6% distant recurrence risk at 9 years  Adjuvant radiation therapy 10/21/2018 to 11/17/2018  Treatment plan: Adjuvant antiestrogen therapy with letrozole 2.5 mg daily x7 years started12/15/2019  Letrozoletoxicities: 1.Hot flashes: Slowly improving  Breast cancer surveillance: 1.Mammogram  08/25/2021: Benign breast density category B 2.Breast exam 9/8/2022Benign,  3.Bone density showed osteopenia T score -1.3: Exercise and calcium and vitamin D are adequate.  Return to clinic in 1 year for surveillance and follow-up.

## 2021-10-12 DIAGNOSIS — E7800 Pure hypercholesterolemia, unspecified: Secondary | ICD-10-CM | POA: Diagnosis not present

## 2021-10-13 DIAGNOSIS — U071 COVID-19: Secondary | ICD-10-CM | POA: Diagnosis not present

## 2021-10-23 ENCOUNTER — Other Ambulatory Visit: Payer: Self-pay

## 2021-10-23 MED ORDER — LETROZOLE 2.5 MG PO TABS: 2.5000 mg | ORAL_TABLET | Freq: Every day | ORAL | 3 refills | Status: DC

## 2021-11-21 ENCOUNTER — Telehealth: Payer: Self-pay | Admitting: *Deleted

## 2021-11-21 NOTE — Telephone Encounter (Signed)
Received call from pt stating recent mammogram was associated with the wrong diagnosis code.  Upon investigation mammogram was ordered by pt OBGYN.  Pt states she will call their office to discuss diagnosis code.

## 2021-11-22 DIAGNOSIS — Z1389 Encounter for screening for other disorder: Secondary | ICD-10-CM | POA: Diagnosis not present

## 2021-11-22 DIAGNOSIS — B009 Herpesviral infection, unspecified: Secondary | ICD-10-CM | POA: Diagnosis not present

## 2021-11-22 DIAGNOSIS — Z6830 Body mass index (BMI) 30.0-30.9, adult: Secondary | ICD-10-CM | POA: Diagnosis not present

## 2021-11-22 DIAGNOSIS — Z01419 Encounter for gynecological examination (general) (routine) without abnormal findings: Secondary | ICD-10-CM | POA: Diagnosis not present

## 2022-02-12 DIAGNOSIS — J019 Acute sinusitis, unspecified: Secondary | ICD-10-CM | POA: Diagnosis not present

## 2022-02-23 ENCOUNTER — Other Ambulatory Visit: Payer: Self-pay

## 2022-02-23 ENCOUNTER — Ambulatory Visit: Payer: BC Managed Care – PPO | Admitting: Psychiatry

## 2022-02-23 ENCOUNTER — Encounter: Payer: Self-pay | Admitting: Psychiatry

## 2022-02-23 DIAGNOSIS — F411 Generalized anxiety disorder: Secondary | ICD-10-CM

## 2022-02-23 DIAGNOSIS — G4700 Insomnia, unspecified: Secondary | ICD-10-CM

## 2022-02-23 MED ORDER — FLUOXETINE HCL 10 MG PO CAPS: ORAL_CAPSULE | ORAL | 1 refills | Status: DC

## 2022-02-23 MED ORDER — ALPRAZOLAM 0.25 MG PO TABS: ORAL_TABLET | ORAL | 1 refills | Status: DC

## 2022-02-23 NOTE — Progress Notes (Signed)
Judith Mcneil ?[id number] ?1956-07-13 ?66 y.o. ? ?Subjective:  ? ?Patient ID:  Judith Mcneil is a 66 y.o. (DOB 10/04/1956) female. ? ?Chief Complaint:  ?Chief Complaint  ?Patient presents with  ? Follow-up  ?  Anxiety  ? ? ?HPI ?Judith Mcneil presents to the office today for follow-up of anxiety. She reports that her anxiety has been manageable. Denies any panic s/s. Denies persistent irritability. Denies depressed mood. She reports that she falls asleep watching TV and then gets woken up by something else on the TV. She reports that se has been physically tired. She reports taking one Xanax every night and denies needing it prn. She reports that her motivation has been good. Concentration has been adequate. Appetite has been good and reports cravings sweets.  ? ?She reports that her daughter with schizophrenia has increased difficulty this time of year.  ? ?She is planning on retiring at the end of the year.  She reports that work stress has been more manageable with retirement in site.  ? ?Judith Mcneil turned 66 yo yesterday. 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  ? ?Had Judith Mcneil in October and then had a cold around the holidays, followed by a sinus infection.  ? ?Review of Systems:  ?Review of Systems  ?Constitutional:  Positive for fatigue.  ?Gastrointestinal: Negative.   ?Musculoskeletal:  Negative for gait problem.  ?Neurological:  Negative for tremors.  ?Psychiatric/Behavioral:    ?     Please refer to HPI  ? ?Medications: I have reviewed the patient's current medications. ? ?Current Outpatient Medications  ?Medication Sig Dispense Refill  ? alendronate (FOSAMAX) 70 MG tablet Take 1 tablet (70 mg total) by mouth once a week. Take with a full glass of water on an empty stomach. 12 tablet 3  ? Calcium Citrate-Vitamin D (CALCIUM CITRATE + D3 PO) Take by mouth.    ? ibuprofen (ADVIL,MOTRIN) 200 MG tablet Take 400 mg by mouth every 6 (six) hours as needed.    ? letrozole (FEMARA) 2.5 MG tablet Take 1  tablet (2.5 mg total) by mouth daily. 90 tablet 3  ? loratadine (CLARITIN) 10 MG tablet Take 10 mg by mouth daily.    ? Multiple Vitamin (MULTIVITAMIN) tablet Take 1 tablet by mouth daily.    ? Omega-3 Fatty Acids (FISH OIL PO) Take by mouth.    ? rosuvastatin (CRESTOR) 20 MG tablet Take by mouth. Taking 3 days a week    ? valACYclovir (VALTREX) 1000 MG tablet Take 1,000 mg by mouth daily as needed (outbreak).    ? [START ON 02/25/2022] ALPRAZolam (XANAX) 0.25 MG tablet TAKE 1 TO 2 TABLETS AT BEDTIME AS NEEDED FOR INSOMNIA AND ANXIETY 135 tablet 1  ? FLUoxetine (PROZAC) 10 MG capsule Take 1-2 capsules daily 180 capsule 1  ? ?No current facility-administered medications for this visit.  ? ? ?Medication Side Effects: None ? ?Allergies: No Known Allergies ? ?Past Medical History:  ?Diagnosis Date  ? Anxiety   ? Breast cancer ([place])   ? Elevated cholesterol   ? Family history of breast cancer   ? Family history of colon cancer in father   ? Medical history non-contributory   ? Personal history of radiation therapy   ? Bilateral   ? PONV (postoperative nausea and vomiting)   ? ? ?Past Medical History, Surgical history, Social history, and Family history were reviewed and updated as appropriate.  ? ?Please see review of systems for further details on the patient's  review from today.  ? ?Objective:  ? ?Physical Exam:  ?There were no vitals taken for this visit. ? ?Physical Exam ?Constitutional:   ?   General: She is not in acute distress. ?Musculoskeletal:     ?   General: No deformity.  ?Neurological:  ?   Mental Status: She is alert and oriented to person, place, and time.  ?   Coordination: Coordination normal.  ?Psychiatric:     ?   Attention and Perception: Attention and perception normal. She does not perceive auditory or visual hallucinations.     ?   Mood and Affect: Mood normal. Mood is not anxious or depressed. Affect is not labile, blunt, angry or inappropriate.     ?   Speech: Speech normal.     ?   Behavior:  Behavior normal.     ?   Thought Content: Thought content normal. Thought content is not paranoid or delusional. Thought content does not include homicidal or suicidal ideation. Thought content does not include homicidal or suicidal plan.     ?   Cognition and Memory: Cognition and memory normal.     ?   Judgment: Judgment normal.  ?   Comments: Insight intact  ? ? ?Lab Review:  ?   ?Component Value Date/Time  ? NA 139 06/25/2018 1231  ? K 4.4 06/25/2018 1231  ? CL 108 06/25/2018 1231  ? CO2 26 06/25/2018 1231  ? GLUCOSE 94 06/25/2018 1231  ? BUN 20 06/25/2018 1231  ? CREATININE 0.95 06/25/2018 1231  ? CALCIUM 9.2 06/25/2018 1231  ? PROT 7.0 06/25/2018 1231  ? ALBUMIN 3.9 06/25/2018 1231  ? AST 19 06/25/2018 1231  ? ALT 25 06/25/2018 1231  ? ALKPHOS 90 06/25/2018 1231  ? BILITOT 0.5 06/25/2018 1231  ? GFRNONAA >60 06/25/2018 1231  ? GFRAA >60 06/25/2018 1231  ? ? ?   ?Component Value Date/Time  ? WBC 6.9 06/25/2018 1231  ? WBC 8.8 08/11/2013 1620  ? RBC 4.49 06/25/2018 1231  ? HGB 13.9 06/25/2018 1231  ? HCT 41.7 06/25/2018 1231  ? PLT 262 06/25/2018 1231  ? MCV 92.8 06/25/2018 1231  ? MCH 30.9 06/25/2018 1231  ? MCHC 33.3 06/25/2018 1231  ? RDW 13.5 06/25/2018 1231  ? LYMPHSABS 1.9 06/25/2018 1231  ? MONOABS 0.4 06/25/2018 1231  ? EOSABS 0.1 06/25/2018 1231  ? BASOSABS 0.0 06/25/2018 1231  ? ? ?No results found for: POCLITH, LITHIUM  ? ?No results found for: PHENYTOIN, PHENOBARB, VALPROATE, CBMZ  ? ?.res ?Assessment: Plan:   ?Recommend continuing to take Prozac 10 mg every other day alternating with 20 mg every other day for anxiety since 20 mg daily has caused affective dulling and 10 mg has not fully controlled anxiety s/s.  ?Continue Alprazolam 0.25 mg 1-2 tabs po QHS prn insomnia and anxiety.  ?Pt to follow-up in 6 months or sooner if clinically indicated.  ?Patient advised to contact office with any questions, adverse effects, or acute worsening in signs and symptoms. ? ? ?Judith Mcneil was seen today for  follow-up. ? ?Diagnoses and all orders for this visit: ? ?Anxiety state ?-     FLUoxetine (PROZAC) 10 MG capsule; Take 1-2 capsules daily ?-     ALPRAZolam (XANAX) 0.25 MG tablet; TAKE 1 TO 2 TABLETS AT BEDTIME AS NEEDED FOR INSOMNIA AND ANXIETY ? ?Insomnia, unspecified type ?-     ALPRAZolam (XANAX) 0.25 MG tablet; TAKE 1 TO 2 TABLETS AT BEDTIME AS NEEDED FOR INSOMNIA AND ANXIETY ? ?  ? ?  Please see After Visit Summary for patient specific instructions. ? ?Future Appointments  ?Date Time Provider [place]  ?08/30/2022  9:00 AM Thayer Headings, PMHNP [facility] None  ?08/31/2022  8:45 AM Nicholas Lose, MD [facility] None  ? ? ?No orders of the defined types were placed in this encounter. ? ? ?------------------------------- ?

## 2022-02-27 DIAGNOSIS — Z03818 Encounter for observation for suspected exposure to other biological agents ruled out: Secondary | ICD-10-CM | POA: Diagnosis not present

## 2022-02-27 DIAGNOSIS — R059 Cough, unspecified: Secondary | ICD-10-CM | POA: Diagnosis not present

## 2022-04-20 DIAGNOSIS — E7800 Pure hypercholesterolemia, unspecified: Secondary | ICD-10-CM | POA: Diagnosis not present

## 2022-06-19 DIAGNOSIS — Z91038 Other insect allergy status: Secondary | ICD-10-CM | POA: Diagnosis not present

## 2022-06-22 DIAGNOSIS — W57XXXS Bitten or stung by nonvenomous insect and other nonvenomous arthropods, sequela: Secondary | ICD-10-CM | POA: Diagnosis not present

## 2022-06-22 DIAGNOSIS — L299 Pruritus, unspecified: Secondary | ICD-10-CM | POA: Diagnosis not present

## 2022-08-03 DIAGNOSIS — E663 Overweight: Secondary | ICD-10-CM | POA: Diagnosis not present

## 2022-08-09 ENCOUNTER — Telehealth: Payer: Self-pay | Admitting: Hematology and Oncology

## 2022-08-09 NOTE — Telephone Encounter (Signed)
Rescheduled appointment per provider PAL. Left message for patient.

## 2022-08-16 ENCOUNTER — Other Ambulatory Visit: Payer: Self-pay | Admitting: Obstetrics and Gynecology

## 2022-08-16 ENCOUNTER — Other Ambulatory Visit: Payer: Self-pay | Admitting: Hematology and Oncology

## 2022-08-16 ENCOUNTER — Other Ambulatory Visit: Payer: Self-pay

## 2022-08-16 DIAGNOSIS — Z9889 Other specified postprocedural states: Secondary | ICD-10-CM

## 2022-08-20 ENCOUNTER — Other Ambulatory Visit: Payer: Self-pay | Admitting: Hematology and Oncology

## 2022-08-20 DIAGNOSIS — Z1231 Encounter for screening mammogram for malignant neoplasm of breast: Secondary | ICD-10-CM

## 2022-08-30 ENCOUNTER — Ambulatory Visit: Payer: BC Managed Care – PPO | Admitting: Psychiatry

## 2022-08-30 ENCOUNTER — Encounter: Payer: Self-pay | Admitting: Psychiatry

## 2022-08-30 DIAGNOSIS — G4700 Insomnia, unspecified: Secondary | ICD-10-CM

## 2022-08-30 DIAGNOSIS — F411 Generalized anxiety disorder: Secondary | ICD-10-CM | POA: Diagnosis not present

## 2022-08-30 MED ORDER — ALPRAZOLAM 0.25 MG PO TABS: ORAL_TABLET | ORAL | 1 refills | Status: DC

## 2022-08-30 MED ORDER — FLUOXETINE HCL 10 MG PO CAPS: ORAL_CAPSULE | ORAL | 1 refills | Status: DC

## 2022-08-30 NOTE — Progress Notes (Signed)
Judith Mcneil [id number] 1956-01-13 66 y.o.  Subjective:   Patient ID:  Judith Mcneil is a 66 y.o. (DOB [birth year]) female.  Chief Complaint:  Chief Complaint  Patient presents with   Follow-up    Anxiety    HPI Judith Mcneil presents to the office today for follow-up of anxiety. Denies any complaints. She reports that she had some difficulty with sleep with reducing Prozac to 10 mg daily. She reports that sleep improved once she increased to previous dose about 2 weeks ago. She was also more tired at lower dose. She reports that her focus is better. She denies any current anxiety other than occ situational stressors. Denies depressed mood. Sleep is good now and will have trouble getting to sleep until daughter comes home. Energy has improved. Motivation is good. Appetite has been "fine" with some stress eating. Denies SI.   Family all had covid a few weeks.   She reports that she plans to postpone retirement since work stress has improved. "It keeps me engaged."    Usually takes one Xanax a night and will take 2 when traveling.   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   Xanax last filled 06/29/22.   Review of Systems:  Review of Systems  Gastrointestinal: Negative.   Musculoskeletal:  Negative for gait problem.  Neurological:  Negative for tremors and headaches.  Psychiatric/Behavioral:         Please refer to HPI    Medications: I have reviewed the patient's current medications.  Current Outpatient Medications  Medication Sig Dispense Refill   alendronate (FOSAMAX) 70 MG tablet Take 1 tablet (70 mg total) by mouth once a week. Take with a full glass of water on an empty stomach. 12 tablet 3   Calcium Citrate-Vitamin D (CALCIUM CITRATE + D3 PO) Take by mouth.     ibuprofen (ADVIL,MOTRIN) 200 MG tablet Take 400 mg by mouth every 6 (six) hours as needed.     letrozole (FEMARA) 2.5 MG tablet Take 1 tablet (2.5 mg  total) by mouth daily. 90 tablet 3   loratadine (CLARITIN) 10 MG tablet Take 10 mg by mouth daily.     Multiple Vitamin (MULTIVITAMIN) tablet Take 1 tablet by mouth daily.     Omega-3 Fatty Acids (FISH OIL PO) Take by mouth.     rosuvastatin (CRESTOR) 20 MG tablet Take by mouth. Taking 3 days a week     valACYclovir (VALTREX) 1000 MG tablet Take 1,000 mg by mouth daily as needed (outbreak).     [START ON 09/21/2022] ALPRAZolam (XANAX) 0.25 MG tablet TAKE 1 TO 2 TABLETS AT BEDTIME AS NEEDED FOR INSOMNIA AND ANXIETY 135 tablet 1   [START ON 09/21/2022] FLUoxetine (PROZAC) 10 MG capsule Take 1-2 capsules daily 180 capsule 1   No current facility-administered medications for this visit.    Medication Side Effects: None  Allergies: No Known Allergies  Past Medical History:  Diagnosis Date   Anxiety    Breast cancer ([place])    Elevated cholesterol    Family history of breast cancer    Family history of colon cancer in father    Medical history non-contributory    Personal history of radiation therapy    Bilateral    PONV (postoperative nausea and vomiting)     Past Medical History, Surgical history, Social history, and Family history were reviewed and updated as appropriate.   Please see review of systems for  further details on the patient's review from today.   Objective:   Physical Exam:  There were no vitals taken for this visit.  Physical Exam Constitutional:      General: She is not in acute distress. Musculoskeletal:        General: No deformity.  Neurological:     Mental Status: She is alert and oriented to person, place, and time.     Coordination: Coordination normal.  Psychiatric:        Attention and Perception: Attention and perception normal. She does not perceive auditory or visual hallucinations.        Mood and Affect: Mood normal. Mood is not anxious or depressed. Affect is not labile, blunt, angry or inappropriate.        Speech: Speech normal.         Behavior: Behavior normal.        Thought Content: Thought content normal. Thought content is not paranoid or delusional. Thought content does not include homicidal or suicidal ideation. Thought content does not include homicidal or suicidal plan.        Cognition and Memory: Cognition and memory normal.        Judgment: Judgment normal.     Comments: Insight intact     Lab Review:     Component Value Date/Time   NA 139 06/25/2018 1231   K 4.4 06/25/2018 1231   CL 108 06/25/2018 1231   CO2 26 06/25/2018 1231   GLUCOSE 94 06/25/2018 1231   BUN 20 06/25/2018 1231   CREATININE 0.95 06/25/2018 1231   CALCIUM 9.2 06/25/2018 1231   PROT 7.0 06/25/2018 1231   ALBUMIN 3.9 06/25/2018 1231   AST 19 06/25/2018 1231   ALT 25 06/25/2018 1231   ALKPHOS 90 06/25/2018 1231   BILITOT 0.5 06/25/2018 1231   GFRNONAA >60 06/25/2018 1231   GFRAA >60 06/25/2018 1231       Component Value Date/Time   WBC 6.9 06/25/2018 1231   WBC 8.8 08/11/2013 1620   RBC 4.49 06/25/2018 1231   HGB 13.9 06/25/2018 1231   HCT 41.7 06/25/2018 1231   PLT 262 06/25/2018 1231   MCV 92.8 06/25/2018 1231   MCH 30.9 06/25/2018 1231   MCHC 33.3 06/25/2018 1231   RDW 13.5 06/25/2018 1231   LYMPHSABS 1.9 06/25/2018 1231   MONOABS 0.4 06/25/2018 1231   EOSABS 0.1 06/25/2018 1231   BASOSABS 0.0 06/25/2018 1231    No results found for: "POCLITH", "LITHIUM"   No results found for: "PHENYTOIN", "PHENOBARB", "VALPROATE", "CBMZ"   .res Assessment: Plan:    Pt seen for 25 minutes and time spent discussing response to recent decrease in Prozac to 10 mg daily. She reports that she noticed sleep disturbance and lower energy with taking Prozac 10 mg daily and that these symptoms resolved when she resumed previous regimen of Prozac 10 mg every other day alternating with 20 mg every other day. Will therefore continue this regimen.  Continue Alprazolam 0.25 mg 1-2 tabs po QHS prn insomnia or anxiety.  Pt to follow-up in 6  months or sooner if clinically indicated.  Patient advised to contact office with any questions, adverse effects, or acute worsening in signs and symptoms.   Judith Mcneil was seen today for follow-up.  Diagnoses and all orders for this visit:  Anxiety state -     FLUoxetine (PROZAC) 10 MG capsule; Take 1-2 capsules daily -     ALPRAZolam (XANAX) 0.25 MG tablet; TAKE 1 TO 2 TABLETS  AT BEDTIME AS NEEDED FOR INSOMNIA AND ANXIETY  Insomnia, unspecified type -     ALPRAZolam (XANAX) 0.25 MG tablet; TAKE 1 TO 2 TABLETS AT BEDTIME AS NEEDED FOR INSOMNIA AND ANXIETY     Please see After Visit Summary for patient specific instructions.  Future Appointments  Date Time Provider [place]  09/04/2022  5:00 PM [facility] MM 2 [facility] GI-BREAST [facility]  09/17/2022  8:30 AM Nicholas Lose, MD [facility] None  02/28/2023  4:00 PM Thayer Headings, PMHNP [facility] None    No orders of the defined types were placed in this encounter.   -------------------------------

## 2022-08-31 ENCOUNTER — Ambulatory Visit: Payer: BC Managed Care – PPO | Admitting: Hematology and Oncology

## 2022-09-04 ENCOUNTER — Ambulatory Visit
Admission: RE | Admit: 2022-09-04 | Discharge: 2022-09-04 | Disposition: A | Discharge status: Home / Self Care | Payer: BC Managed Care – PPO | Source: Ambulatory Visit | Attending: Hematology and Oncology | Admitting: Hematology and Oncology

## 2022-09-04 DIAGNOSIS — Z1231 Encounter for screening mammogram for malignant neoplasm of breast: Secondary | ICD-10-CM | POA: Diagnosis not present

## 2022-09-14 NOTE — Progress Notes (Signed)
Patient Care Team: Vernie Shanks, MD (Inactive) as PCP - General (Family Medicine) Excell Seltzer, MD (Inactive) as Consulting Physician (General Surgery) Nicholas Lose, MD as Consulting Physician (Hematology and Oncology) Kyung Rudd, MD as Consulting Physician (Radiation Oncology)  DIAGNOSIS: No diagnosis found.  SUMMARY OF ONCOLOGIC HISTORY: Oncology History  Malignant neoplasm of upper-outer quadrant of left breast in female, estrogen receptor positive ([place])  06/19/2018 Initial Diagnosis   Screening detected left breast mass and distortion on further evaluation of mass went away and the distortion did not have a sonographic correlate.  Stereotactic biopsy revealed grade 2 IDC ER 95%, PR 90%, Ki-67 3%, HER-2 negative ratio 1.09 along with LCIS TX N0 stage Ia clinical stage    Genetic Testing   Negative genetic testing on the [facility] Breast Cancer STAT panel and Common Hereditary Cancers Panel. The STAT Breast cancer panel offered by [facility] includes sequencing and rearrangement analysis for the following 9 genes:  ATM, BRCA1, BRCA2, CDH1, CHEK2, PALB2, PTEN, STK11 and TP53.  The Common Hereditary Cancer Panel offered by [facility] includes sequencing and/or deletion duplication testing of the following 47 genes: APC, ATM, AXIN2, BARD1, BMPR1A, BRCA1, BRCA2, BRIP1, CDH1, CDKN2A (p14ARF), CDKN2A (p16INK4a), CKD4, CHEK2, CTNNA1, DICER1, EPCAM (Deletion/duplication testing only), GREM1 (promoter region deletion/duplication testing only), KIT, MEN1, MLH1, MSH2, MSH3, MSH6, MUTYH, NBN, NF1, NHTL1, PALB2, PDGFRA, PMS2, POLD1, POLE, PTEN, RAD50, RAD51C, RAD51D, SDHB, SDHC, SDHD, SMAD4, SMARCA4. STK11, TP53, TSC1, TSC2, and VHL.  The following genes were evaluated for sequence changes only: SDHA and HOXB13 c.251G>A variant only.  The report date is 07/21/2018.   08/01/2018 Surgery   Left lumpectomy: IDC grade 1, 1.2 cm, margins negative, 0/1 lymph node negative ER 94%, PR 90%, HER-2 negative ratio  1.09, Ki-67 3%, T1CN0 stage Ia; Right lumpectomy: DCIS high-grade 1.4 cm, broadly less than 0.1 cm to posterior margin, ER 100%, PR 100%, Tis NX stage 0   08/08/2018 Oncotype testing   Oncotype DX score 19: Low risk 6% distant recurrence risk at 9 years   08/27/2018 Cancer Staging   Staging form: Breast, AJCC 8th Edition - Pathologic: Stage IA (pT1c, pN0, cM0, G1, ER+, PR+, HER2-, Oncotype DX score: 19) - Signed by Gardenia Phlegm, NP on 08/27/2018   10/21/2018 - 11/17/2018 Radiation Therapy   Adjuvant radiation therapy   01/24/2019 -  Anti-estrogen oral therapy   Letrozole daily     CHIEF COMPLIANT: Follow-up of left breast cancer on letrozole   INTERVAL HISTORY: Judith Mcneil is a 66 y.o. with above-mentioned history of left breast cancer treated with lumpectomy, radiation, and who is currently on anti-estrogen therapy with letrozole. She presents to the clinic today for a follow-up.    ALLERGIES:  has No Known Allergies.  MEDICATIONS:  Current Outpatient Medications  Medication Sig Dispense Refill   alendronate (FOSAMAX) 70 MG tablet Take 1 tablet (70 mg total) by mouth once a week. Take with a full glass of water on an empty stomach. 12 tablet 3   [START ON 09/21/2022] ALPRAZolam (XANAX) 0.25 MG tablet TAKE 1 TO 2 TABLETS AT BEDTIME AS NEEDED FOR INSOMNIA AND ANXIETY 135 tablet 1   Calcium Citrate-Vitamin D (CALCIUM CITRATE + D3 PO) Take by mouth.     [START ON 09/21/2022] FLUoxetine (PROZAC) 10 MG capsule Take 1-2 capsules daily 180 capsule 1   ibuprofen (ADVIL,MOTRIN) 200 MG tablet Take 400 mg by mouth every 6 (six) hours as needed.     letrozole (FEMARA) 2.5 MG tablet Take  1 tablet (2.5 mg total) by mouth daily. 90 tablet 3   loratadine (CLARITIN) 10 MG tablet Take 10 mg by mouth daily.     Multiple Vitamin (MULTIVITAMIN) tablet Take 1 tablet by mouth daily.     Omega-3 Fatty Acids (FISH OIL PO) Take by mouth.     rosuvastatin (CRESTOR) 20 MG tablet Take by mouth.  Taking 3 days a week     valACYclovir (VALTREX) 1000 MG tablet Take 1,000 mg by mouth daily as needed (outbreak).     No current facility-administered medications for this visit.    PHYSICAL EXAMINATION: ECOG PERFORMANCE STATUS: {CHL ONC ECOG [phone]}  There were no vitals filed for this visit. There were no vitals filed for this visit.  BREAST:*** No palpable masses or nodules in either right or left breasts. No palpable axillary supraclavicular or infraclavicular adenopathy no breast tenderness or nipple discharge. (exam performed in the presence of a chaperone)  LABORATORY DATA:  I have reviewed the data as listed    Latest Ref Rng & Units 06/25/2018   12:31 PM  CMP  Glucose 70 - 99 mg/dL 94   BUN 8 - 23 mg/dL 20   Creatinine 0.44 - 1.00 mg/dL 0.95   Sodium 135 - 145 mmol/L 139   Potassium 3.5 - 5.1 mmol/L 4.4   Chloride 98 - 111 mmol/L 108   CO2 22 - 32 mmol/L 26   Calcium 8.9 - 10.3 mg/dL 9.2   Total Protein 6.5 - 8.1 g/dL 7.0   Total Bilirubin 0.3 - 1.2 mg/dL 0.5   Alkaline Phos 38 - 126 U/L 90   AST 15 - 41 U/L 19   ALT 0 - 44 U/L 25     Lab Results  Component Value Date   WBC 6.9 06/25/2018   HGB 13.9 06/25/2018   HCT 41.7 06/25/2018   MCV 92.8 06/25/2018   PLT 262 06/25/2018   NEUTROABS 4.4 06/25/2018    ASSESSMENT & PLAN:  No problem-specific Assessment & Plan notes found for this encounter.    No orders of the defined types were placed in this encounter.  The patient has a good understanding of the overall plan. she agrees with it. she will call with any problems that may develop before the next visit here. Total time spent: 30 mins including face to face time and time spent for planning, charting and co-ordination of care   Suzzette Righter, [place] 09/14/22    I Gardiner Coins am scribing for Dr. Lindi Adie  ***

## 2022-09-17 ENCOUNTER — Other Ambulatory Visit: Payer: Self-pay

## 2022-09-17 ENCOUNTER — Inpatient Hospital Stay: Payer: BC Managed Care – PPO | Attending: Hematology and Oncology | Admitting: Hematology and Oncology

## 2022-09-17 VITALS — BP 133/79 | HR 71 | Temp 97.7°F | Resp 18 | Ht 64.0 in | Wt 163.2 lb

## 2022-09-17 DIAGNOSIS — Z79811 Long term (current) use of aromatase inhibitors: Secondary | ICD-10-CM | POA: Diagnosis not present

## 2022-09-17 DIAGNOSIS — Z170 Estrogen receptor positive status [ER+]: Secondary | ICD-10-CM | POA: Diagnosis not present

## 2022-09-17 DIAGNOSIS — C50412 Malignant neoplasm of upper-outer quadrant of left female breast: Secondary | ICD-10-CM | POA: Insufficient documentation

## 2022-09-17 DIAGNOSIS — Z79899 Other long term (current) drug therapy: Secondary | ICD-10-CM | POA: Diagnosis not present

## 2022-09-17 DIAGNOSIS — Z780 Asymptomatic menopausal state: Secondary | ICD-10-CM

## 2022-09-17 MED ORDER — ALENDRONATE SODIUM 70 MG PO TABS: 70.0000 mg | ORAL_TABLET | ORAL | 3 refills | Status: DC

## 2022-09-17 MED ORDER — LETROZOLE 2.5 MG PO TABS: 2.5000 mg | ORAL_TABLET | Freq: Every day | ORAL | 3 refills | Status: DC

## 2022-09-17 NOTE — Assessment & Plan Note (Signed)
08/01/2018:Left lumpectomy: IDC grade 1, 1.2 cm, margins negative, 0/1 lymph node negative ER 94%, PR 90%, HER-2 negative ratio 1.09, Ki-67 3%, T1CN0 stage Ia;  Right lumpectomy: DCIS high-grade 1.4 cm, broadly less than 0.1 cm to posterior margin, ER 100%, PR 100%, Tis NX stage 0 Oncotype DX score 19: Low risk 6% distant recurrence risk at 9 years  Adjuvant radiation therapy 10/21/2018 to 11/17/2018  Treatment plan: Adjuvant antiestrogen therapy with letrozole 2.5 mg daily x7 years started12/15/2019  Letrozoletoxicities: 1.Hot flashes:Slowly improving  Breast cancer surveillance: 1.Mammogram9/15/2023: Benign breast density category B 2.breast exam 09/17/2022: Benign 3. Bone density showed osteopenia T score -2.1: Fosamax along with exercise and calcium and vitamin D.   Return to clinic in 1 year for surveillance and follow-up.

## 2022-09-19 DIAGNOSIS — U071 COVID-19: Secondary | ICD-10-CM | POA: Diagnosis not present

## 2022-10-09 ENCOUNTER — Other Ambulatory Visit: Payer: Self-pay | Admitting: Hematology and Oncology

## 2022-10-09 DIAGNOSIS — Z780 Asymptomatic menopausal state: Secondary | ICD-10-CM

## 2022-10-09 DIAGNOSIS — C50412 Malignant neoplasm of upper-outer quadrant of left female breast: Secondary | ICD-10-CM

## 2022-10-24 DIAGNOSIS — J029 Acute pharyngitis, unspecified: Secondary | ICD-10-CM | POA: Diagnosis not present

## 2022-12-03 DIAGNOSIS — H40013 Open angle with borderline findings, low risk, bilateral: Secondary | ICD-10-CM | POA: Diagnosis not present

## 2022-12-12 DIAGNOSIS — J069 Acute upper respiratory infection, unspecified: Secondary | ICD-10-CM | POA: Diagnosis not present

## 2022-12-12 DIAGNOSIS — Z03818 Encounter for observation for suspected exposure to other biological agents ruled out: Secondary | ICD-10-CM | POA: Diagnosis not present

## 2022-12-12 DIAGNOSIS — Z6827 Body mass index (BMI) 27.0-27.9, adult: Secondary | ICD-10-CM | POA: Diagnosis not present

## 2023-01-31 DIAGNOSIS — H938X9 Other specified disorders of ear, unspecified ear: Secondary | ICD-10-CM | POA: Diagnosis not present

## 2023-01-31 DIAGNOSIS — E7800 Pure hypercholesterolemia, unspecified: Secondary | ICD-10-CM | POA: Diagnosis not present

## 2023-01-31 DIAGNOSIS — Z23 Encounter for immunization: Secondary | ICD-10-CM | POA: Diagnosis not present

## 2023-02-04 DIAGNOSIS — Z01419 Encounter for gynecological examination (general) (routine) without abnormal findings: Secondary | ICD-10-CM | POA: Diagnosis not present

## 2023-02-04 DIAGNOSIS — Z1389 Encounter for screening for other disorder: Secondary | ICD-10-CM | POA: Diagnosis not present

## 2023-02-19 ENCOUNTER — Other Ambulatory Visit: Payer: BC Managed Care – PPO

## 2023-02-24 ENCOUNTER — Other Ambulatory Visit: Payer: Self-pay | Admitting: Psychiatry

## 2023-02-24 DIAGNOSIS — F411 Generalized anxiety disorder: Secondary | ICD-10-CM

## 2023-02-24 NOTE — Telephone Encounter (Signed)
Has appt with Janett Billow on 3/7

## 2023-02-26 ENCOUNTER — Other Ambulatory Visit: Payer: BC Managed Care – PPO

## 2023-02-28 ENCOUNTER — Ambulatory Visit: Payer: Medicare Other | Admitting: Psychiatry

## 2023-04-01 ENCOUNTER — Encounter: Payer: Self-pay | Admitting: Psychiatry

## 2023-04-01 ENCOUNTER — Ambulatory Visit: Payer: BC Managed Care – PPO | Admitting: Psychiatry

## 2023-04-01 DIAGNOSIS — G4700 Insomnia, unspecified: Secondary | ICD-10-CM | POA: Diagnosis not present

## 2023-04-01 DIAGNOSIS — F411 Generalized anxiety disorder: Secondary | ICD-10-CM | POA: Diagnosis not present

## 2023-04-01 MED ORDER — ALPRAZOLAM 0.25 MG PO TABS: ORAL_TABLET | ORAL | 1 refills | Status: DC

## 2023-04-01 MED ORDER — FLUOXETINE HCL 10 MG PO CAPS: 20.0000 mg | ORAL_CAPSULE | Freq: Every day | ORAL | 2 refills | Status: DC

## 2023-04-01 NOTE — Progress Notes (Signed)
Judith Mcneil [id number] 05/23/1956 67 y.o.  Subjective:   Patient ID:  Judith Mcneil is a 67 y.o. (DOB [birth year]) female.  Chief Complaint:  Chief Complaint  Patient presents with   Follow-up    Anxiety and insomnia    HPI Judith Mcneil presents to the office today for follow-up of anxiety and insomnia.  She reports that anxiety has been well controlled with Prozac 20 mg every other day alternating with 10 mg every other days. She reports that she received only #90 Prozac for a 90-day supply and decreased dose to Prozac 10 mg daily. She reports that she noticed her concentration and motivation were not as good at the 10 mg dose. She reports that she also felt "antsy" at this dose. She resumed Prozac 20 mg every other day alternating with 10 mg every other day and these symptoms improved. Denies anxiety or depressive s/s with usual dose.    Concentration has been adequate. Denies depressed mood. Occ situational anxiety. Sleeping ok. Alprazolam is helpful for sleep initiation. Takes one Alprazolam at bedtime. Energy is ok- "I'm tired these days." Motivation is good. Appetite has been ok. Less cravings for sweets. Denies SI or HI.   Recently went to the beach with family and enjoyed this. Work has been going ok. 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   Alprazolam last filled 02/25/23.   Review of Systems:  Review of Systems  Gastrointestinal: Negative.   Musculoskeletal:  Negative for gait problem.  Neurological:  Negative for tremors.  Psychiatric/Behavioral:         Please refer to HPI   Had breast cancer 4 years ago in July.   Medications: I have reviewed the patient's current medications.  Current Outpatient Medications  Medication Sig Dispense Refill   alendronate (FOSAMAX) 70 MG tablet Take 1 tablet (70 mg total) by mouth once a week. Take with a full glass of water on an empty stomach. 12 tablet 3   Calcium Citrate-Vitamin D (CALCIUM CITRATE  + D3 PO) Take by mouth.     FLUoxetine (PROZAC) 10 MG capsule Take 2 capsules (20 mg total) by mouth daily. 180 capsule 2   ibuprofen (ADVIL,MOTRIN) 200 MG tablet Take 400 mg by mouth every 6 (six) hours as needed.     letrozole (FEMARA) 2.5 MG tablet Take 1 tablet (2.5 mg total) by mouth daily. 90 tablet 3   loratadine (CLARITIN) 10 MG tablet Take 10 mg by mouth daily.     Multiple Vitamin (MULTIVITAMIN) tablet Take 1 tablet by mouth daily.     Omega-3 Fatty Acids (FISH OIL PO) Take by mouth.     rosuvastatin (CRESTOR) 20 MG tablet Take by mouth. Taking 3 days a week     [START ON 05/20/2023] ALPRAZolam (XANAX) 0.25 MG tablet TAKE 1 TO 2 TABLETS AT BEDTIME AS NEEDED FOR INSOMNIA AND ANXIETY 135 tablet 1   valACYclovir (VALTREX) 1000 MG tablet Take 1,000 mg by mouth daily as needed (outbreak).     No current facility-administered medications for this visit.    Medication Side Effects: None  Allergies: No Known Allergies  Past Medical History:  Diagnosis Date   Anxiety    Breast cancer    Elevated cholesterol    Family history of breast cancer    Family history of colon cancer in father    Medical history non-contributory    Personal history of radiation therapy    Bilateral    PONV (postoperative  nausea and vomiting)     Past Medical History, Surgical history, Social history, and Family history were reviewed and updated as appropriate.   Please see review of systems for further details on the patient's review from today.   Objective:   Physical Exam:  There were no vitals taken for this visit.  Physical Exam Constitutional:      General: She is not in acute distress. Musculoskeletal:        General: No deformity.  Neurological:     Mental Status: She is alert and oriented to person, place, and time.     Coordination: Coordination normal.  Psychiatric:        Attention and Perception: Attention and perception normal. She does not perceive auditory or visual  hallucinations.        Mood and Affect: Mood normal. Mood is not anxious or depressed. Affect is not labile, blunt, angry or inappropriate.        Speech: Speech normal.        Behavior: Behavior normal.        Thought Content: Thought content normal. Thought content is not paranoid or delusional. Thought content does not include homicidal or suicidal ideation. Thought content does not include homicidal or suicidal plan.        Cognition and Memory: Cognition and memory normal.        Judgment: Judgment normal.     Comments: Insight intact     Lab Review:     Component Value Date/Time   NA 139 06/25/2018 1231   K 4.4 06/25/2018 1231   CL 108 06/25/2018 1231   CO2 26 06/25/2018 1231   GLUCOSE 94 06/25/2018 1231   BUN 20 06/25/2018 1231   CREATININE 0.95 06/25/2018 1231   CALCIUM 9.2 06/25/2018 1231   PROT 7.0 06/25/2018 1231   ALBUMIN 3.9 06/25/2018 1231   AST 19 06/25/2018 1231   ALT 25 06/25/2018 1231   ALKPHOS 90 06/25/2018 1231   BILITOT 0.5 06/25/2018 1231   GFRNONAA >60 06/25/2018 1231   GFRAA >60 06/25/2018 1231       Component Value Date/Time   WBC 6.9 06/25/2018 1231   WBC 8.8 08/11/2013 1620   RBC 4.49 06/25/2018 1231   HGB 13.9 06/25/2018 1231   HCT 41.7 06/25/2018 1231   PLT 262 06/25/2018 1231   MCV 92.8 06/25/2018 1231   MCH 30.9 06/25/2018 1231   MCHC 33.3 06/25/2018 1231   RDW 13.5 06/25/2018 1231   LYMPHSABS 1.9 06/25/2018 1231   MONOABS 0.4 06/25/2018 1231   EOSABS 0.1 06/25/2018 1231   BASOSABS 0.0 06/25/2018 1231    No results found for: "POCLITH", "LITHIUM"   No results found for: "PHENYTOIN", "PHENOBARB", "VALPROATE", "CBMZ"   .res Assessment: Plan:    Recommend continuing Prozac 20 mg every other day alternating with 10 mg every other day since she has some breakthrough anxiety, lower motivation, and concentration difficulties with 10 mg daily and has some side effects with taking Prozac 20 mg daily. Will change Prozac script to two 10 mg  capsules daily to help ensure she receives an adequate supply of medication. Advised pt to contact office if she has any difficulty with getting an adequate supply from her pharmacy.  Continue Alprazolam 0.25 mg 1-2 tabs po QHS prn insomnia or anxiety.  Pt to follow-up in 6 months or sooner if clinically indicated.  Patient advised to contact office with any questions, adverse effects, or acute worsening in signs and symptoms.  Judith Mcneil was seen today for follow-up.  Diagnoses and all orders for this visit:  Anxiety state -     FLUoxetine (PROZAC) 10 MG capsule; Take 2 capsules (20 mg total) by mouth daily. -     ALPRAZolam (XANAX) 0.25 MG tablet; TAKE 1 TO 2 TABLETS AT BEDTIME AS NEEDED FOR INSOMNIA AND ANXIETY  Insomnia, unspecified type -     ALPRAZolam (XANAX) 0.25 MG tablet; TAKE 1 TO 2 TABLETS AT BEDTIME AS NEEDED FOR INSOMNIA AND ANXIETY     Please see After Visit Summary for patient specific instructions.  Future Appointments  Date Time Provider Department Center  08/06/2023  9:00 AM [facility] DX DEXA 1 [facility]  09/18/2023  8:30 AM Serena Croissant, MD [facility] None  09/30/2023  4:15 PM Corie Chiquito, PMHNP [facility] None    No orders of the defined types were placed in this encounter.   -------------------------------

## 2023-04-29 DIAGNOSIS — Z23 Encounter for immunization: Secondary | ICD-10-CM | POA: Diagnosis not present

## 2023-04-29 DIAGNOSIS — Z0000 Encounter for general adult medical examination without abnormal findings: Secondary | ICD-10-CM | POA: Diagnosis not present

## 2023-04-29 DIAGNOSIS — E559 Vitamin D deficiency, unspecified: Secondary | ICD-10-CM | POA: Diagnosis not present

## 2023-04-29 DIAGNOSIS — E7800 Pure hypercholesterolemia, unspecified: Secondary | ICD-10-CM | POA: Diagnosis not present

## 2023-04-30 DIAGNOSIS — Z0000 Encounter for general adult medical examination without abnormal findings: Secondary | ICD-10-CM | POA: Diagnosis not present

## 2023-04-30 DIAGNOSIS — E559 Vitamin D deficiency, unspecified: Secondary | ICD-10-CM | POA: Diagnosis not present

## 2023-04-30 DIAGNOSIS — E7800 Pure hypercholesterolemia, unspecified: Secondary | ICD-10-CM | POA: Diagnosis not present

## 2023-05-30 DIAGNOSIS — H16223 Keratoconjunctivitis sicca, not specified as Sjogren's, bilateral: Secondary | ICD-10-CM | POA: Diagnosis not present

## 2023-07-02 DIAGNOSIS — R1030 Lower abdominal pain, unspecified: Secondary | ICD-10-CM | POA: Diagnosis not present

## 2023-07-02 DIAGNOSIS — R300 Dysuria: Secondary | ICD-10-CM | POA: Diagnosis not present

## 2023-07-02 DIAGNOSIS — R399 Unspecified symptoms and signs involving the genitourinary system: Secondary | ICD-10-CM | POA: Diagnosis not present

## 2023-07-02 DIAGNOSIS — R109 Unspecified abdominal pain: Secondary | ICD-10-CM | POA: Diagnosis not present

## 2023-07-24 DIAGNOSIS — U071 COVID-19: Secondary | ICD-10-CM | POA: Diagnosis not present

## 2023-08-06 ENCOUNTER — Ambulatory Visit
Admission: RE | Admit: 2023-08-06 | Discharge: 2023-08-06 | Disposition: A | Discharge status: Home / Self Care | Payer: BC Managed Care – PPO | Source: Ambulatory Visit | Attending: Hematology and Oncology | Admitting: Hematology and Oncology

## 2023-08-06 DIAGNOSIS — N958 Other specified menopausal and perimenopausal disorders: Secondary | ICD-10-CM | POA: Diagnosis not present

## 2023-08-06 DIAGNOSIS — M8588 Other specified disorders of bone density and structure, other site: Secondary | ICD-10-CM | POA: Diagnosis not present

## 2023-08-06 DIAGNOSIS — Z780 Asymptomatic menopausal state: Secondary | ICD-10-CM

## 2023-08-06 DIAGNOSIS — E349 Endocrine disorder, unspecified: Secondary | ICD-10-CM | POA: Diagnosis not present

## 2023-08-12 DIAGNOSIS — E7800 Pure hypercholesterolemia, unspecified: Secondary | ICD-10-CM | POA: Diagnosis not present

## 2023-09-05 ENCOUNTER — Other Ambulatory Visit: Payer: Self-pay | Admitting: Hematology and Oncology

## 2023-09-18 ENCOUNTER — Inpatient Hospital Stay: Payer: BC Managed Care – PPO | Attending: Hematology and Oncology | Admitting: Hematology and Oncology

## 2023-09-18 VITALS — BP 108/68 | HR 85 | Temp 97.7°F | Resp 18 | Ht 64.0 in | Wt 154.6 lb

## 2023-09-18 DIAGNOSIS — R232 Flushing: Secondary | ICD-10-CM | POA: Diagnosis not present

## 2023-09-18 DIAGNOSIS — Z79811 Long term (current) use of aromatase inhibitors: Secondary | ICD-10-CM | POA: Diagnosis not present

## 2023-09-18 DIAGNOSIS — Z923 Personal history of irradiation: Secondary | ICD-10-CM | POA: Insufficient documentation

## 2023-09-18 DIAGNOSIS — Z79899 Other long term (current) drug therapy: Secondary | ICD-10-CM | POA: Diagnosis not present

## 2023-09-18 DIAGNOSIS — Z170 Estrogen receptor positive status [ER+]: Secondary | ICD-10-CM | POA: Diagnosis not present

## 2023-09-18 DIAGNOSIS — C50412 Malignant neoplasm of upper-outer quadrant of left female breast: Secondary | ICD-10-CM | POA: Insufficient documentation

## 2023-09-18 MED ORDER — LETROZOLE 2.5 MG PO TABS: 2.5000 mg | ORAL_TABLET | Freq: Every day | ORAL | 3 refills | Status: DC

## 2023-09-18 NOTE — Assessment & Plan Note (Signed)
08/01/2018:Left lumpectomy: IDC grade 1, 1.2 cm, margins negative, 0/1 lymph node negative ER 94%, PR 90%, HER-2 negative ratio 1.09, Ki-67 3%, T1CN0 stage Ia;  Right lumpectomy: DCIS high-grade 1.4 cm, broadly less than 0.1 cm to posterior margin, ER 100%, PR 100%, Tis NX stage 0 Oncotype DX score 19: Low risk 6% distant recurrence risk at 9 years   Adjuvant radiation therapy 10/21/2018 to 11/17/2018   Treatment plan: Adjuvant antiestrogen therapy with letrozole 2.5 mg daily x7 years started 12/07/2018   Letrozole toxicities: 1.  Hot flashes: Slowly improving   Breast cancer surveillance: 1.  Mammogram 09/07/2022: Benign breast density category B 2. breast exam 09/18/2023: Benign 3. Bone density 08/06/2023: Showed osteopenia T score -2 (improved from -2.1): Fosamax along with exercise and calcium and vitamin D.    Return to clinic in 1 year for surveillance and follow-up.

## 2023-09-18 NOTE — Progress Notes (Signed)
Patient Care Team: Ileana Ladd, MD (Inactive) as PCP - General (Family Medicine) Glenna Fellows, MD (Inactive) as Consulting Physician (General Surgery) Serena Croissant, MD as Consulting Physician (Hematology and Oncology) Dorothy Puffer, MD as Consulting Physician (Radiation Oncology)  DIAGNOSIS:  Encounter Diagnosis  Name Primary?   Malignant neoplasm of upper-outer quadrant of left breast in female, estrogen receptor positive (HCC) Yes    SUMMARY OF ONCOLOGIC HISTORY: Oncology History  Malignant neoplasm of upper-outer quadrant of left breast in female, estrogen receptor positive (HCC)  06/19/2018 Initial Diagnosis   Screening detected left breast mass and distortion on further evaluation of mass went away and the distortion did not have a sonographic correlate.  Stereotactic biopsy revealed grade 2 IDC ER 95%, PR 90%, Ki-67 3%, HER-2 negative ratio 1.09 along with LCIS TX N0 stage Ia clinical stage    Genetic Testing   Negative genetic testing on the [facility] Breast Cancer STAT panel and Common Hereditary Cancers Panel. The STAT Breast cancer panel offered by [facility] includes sequencing and rearrangement analysis for the following 9 genes:  ATM, BRCA1, BRCA2, CDH1, CHEK2, PALB2, PTEN, STK11 and TP53.  The Common Hereditary Cancer Panel offered by [facility] includes sequencing and/or deletion duplication testing of the following 47 genes: APC, ATM, AXIN2, BARD1, BMPR1A, BRCA1, BRCA2, BRIP1, CDH1, CDKN2A (p14ARF), CDKN2A (p16INK4a), CKD4, CHEK2, CTNNA1, DICER1, EPCAM (Deletion/duplication testing only), GREM1 (promoter region deletion/duplication testing only), KIT, MEN1, MLH1, MSH2, MSH3, MSH6, MUTYH, NBN, NF1, NHTL1, PALB2, PDGFRA, PMS2, POLD1, POLE, PTEN, RAD50, RAD51C, RAD51D, SDHB, SDHC, SDHD, SMAD4, SMARCA4. STK11, TP53, TSC1, TSC2, and VHL.  The following genes were evaluated for sequence changes only: SDHA and HOXB13 c.251G>A variant only.  The report date is 07/21/2018.   08/01/2018  Surgery   Left lumpectomy: IDC grade 1, 1.2 cm, margins negative, 0/1 lymph node negative ER 94%, PR 90%, HER-2 negative ratio 1.09, Ki-67 3%, T1CN0 stage Ia; Right lumpectomy: DCIS high-grade 1.4 cm, broadly less than 0.1 cm to posterior margin, ER 100%, PR 100%, Tis NX stage 0   08/08/2018 Oncotype testing   Oncotype DX score 19: Low risk 6% distant recurrence risk at 9 years   08/27/2018 Cancer Staging   Staging form: Breast, AJCC 8th Edition - Pathologic: Stage IA (pT1c, pN0, cM0, G1, ER+, PR+, HER2-, Oncotype DX score: 19) - Signed by Loa Socks, NP on 08/27/2018   10/21/2018 - 11/17/2018 Radiation Therapy   Adjuvant radiation therapy   01/24/2019 -  Anti-estrogen oral therapy   Letrozole daily     CHIEF COMPLIANT: Follow-up on letrozole therapy  Discussed the use of AI scribe software for clinical note transcription with the patient, who gave verbal consent to proceed.  History of Present Illness   The patient, a breast cancer survivor, presents for her annual follow-up. She was diagnosed in 2019 and has been on letrozole for five years. Initially, she experienced hot flashes as a side effect, but these have improved since switching to morning dosing. She has also been taking alendronate for bone health, with no reported side effects. Her bone density has slightly improved, with a current T-score of -1.7, compared to -2.1 previously. She has been diligent about taking her medications in the morning with water and has not experienced any issues with esophagitis. She also reports some soreness in the area of her previous surgery, particularly after carrying her grandchildren, but she does not find this discomfort to be severe.         ALLERGIES:  is  allergic to codeine.  MEDICATIONS:  Current Outpatient Medications  Medication Sig Dispense Refill   alendronate (FOSAMAX) 70 MG tablet TAKE 1 TABLET ONCE A WEEK WITH A FULL GLASS OF WATER ON AN EMPTY STOMACH 12 tablet 3    ALPRAZolam (XANAX) 0.25 MG tablet TAKE 1 TO 2 TABLETS AT BEDTIME AS NEEDED FOR INSOMNIA AND ANXIETY 135 tablet 1   Calcium Citrate-Vitamin D (CALCIUM CITRATE + D3 PO) Take by mouth.     ibuprofen (ADVIL,MOTRIN) 200 MG tablet Take 400 mg by mouth every 6 (six) hours as needed.     loratadine (CLARITIN) 10 MG tablet Take 10 mg by mouth daily.     Multiple Vitamin (MULTIVITAMIN) tablet Take 1 tablet by mouth daily.     Omega-3 Fatty Acids (FISH OIL PO) Take by mouth.     rosuvastatin (CRESTOR) 20 MG tablet Take by mouth. Taking 3 days a week     valACYclovir (VALTREX) 1000 MG tablet Take 1,000 mg by mouth daily as needed (outbreak).     FLUoxetine (PROZAC) 10 MG capsule Take 2 capsules (20 mg total) by mouth daily. (Patient taking differently: Take 10 mg by mouth daily. 1 per day) 180 capsule 2   letrozole (FEMARA) 2.5 MG tablet Take 1 tablet (2.5 mg total) by mouth daily. 90 tablet 3   No current facility-administered medications for this visit.    PHYSICAL EXAMINATION: ECOG PERFORMANCE STATUS: 1 - Symptomatic but completely ambulatory  Vitals:   09/18/23 0833  BP: 108/68  Pulse: 85  Resp: 18  Temp: 97.7 F (36.5 C)  SpO2: 98%   Filed Weights   09/18/23 0833  Weight: 154 lb 9.6 oz (70.1 kg)      LABORATORY DATA:  I have reviewed the data as listed    Latest Ref Rng & Units 06/25/2018   12:31 PM  CMP  Glucose 70 - 99 mg/dL 94   BUN 8 - 23 mg/dL 20   Creatinine 1.32 - 1.00 mg/dL 4.40   Sodium 102 - 725 mmol/L 139   Potassium 3.5 - 5.1 mmol/L 4.4   Chloride 98 - 111 mmol/L 108   CO2 22 - 32 mmol/L 26   Calcium 8.9 - 10.3 mg/dL 9.2   Total Protein 6.5 - 8.1 g/dL 7.0   Total Bilirubin 0.3 - 1.2 mg/dL 0.5   Alkaline Phos 38 - 126 U/L 90   AST 15 - 41 U/L 19   ALT 0 - 44 U/L 25     Lab Results  Component Value Date   WBC 6.9 06/25/2018   HGB 13.9 06/25/2018   HCT 41.7 06/25/2018   MCV 92.8 06/25/2018   PLT 262 06/25/2018   NEUTROABS 4.4 06/25/2018    ASSESSMENT &  PLAN:  Malignant neoplasm of upper-outer quadrant of left breast in female, estrogen receptor positive (HCC) 08/01/2018:Left lumpectomy: IDC grade 1, 1.2 cm, margins negative, 0/1 lymph node negative ER 94%, PR 90%, HER-2 negative ratio 1.09, Ki-67 3%, T1CN0 stage Ia;  Right lumpectomy: DCIS high-grade 1.4 cm, broadly less than 0.1 cm to posterior margin, ER 100%, PR 100%, Tis NX stage 0 Oncotype DX score 19: Low risk 6% distant recurrence risk at 9 years   Adjuvant radiation therapy 10/21/2018 to 11/17/2018   Treatment plan: Adjuvant antiestrogen therapy with letrozole 2.5 mg daily x7 years started 12/07/2018   Letrozole toxicities: 1.  Hot flashes: Slowly improving   Breast cancer surveillance: 1.  Mammogram 09/07/2022: Benign breast density category B 2. breast exam  09/18/2023: Benign 3. Bone density 08/06/2023: Showed osteopenia T score -2 (improved from -2.1): Fosamax along with exercise and calcium and vitamin D.    Return to clinic in 1 year for surveillance and follow-up.     No orders of the defined types were placed in this encounter.  The patient has a good understanding of the overall plan. she agrees with it. she will call with any problems that may develop before the next visit here. Total time spent: 30 mins including face to face time and time spent for planning, charting and co-ordination of care   Tamsen Meek, MD 09/18/23

## 2023-09-30 ENCOUNTER — Encounter: Payer: Self-pay | Admitting: Psychiatry

## 2023-09-30 ENCOUNTER — Ambulatory Visit (INDEPENDENT_AMBULATORY_CARE_PROVIDER_SITE_OTHER): Payer: BC Managed Care – PPO | Admitting: Psychiatry

## 2023-09-30 DIAGNOSIS — F411 Generalized anxiety disorder: Secondary | ICD-10-CM

## 2023-09-30 DIAGNOSIS — G4700 Insomnia, unspecified: Secondary | ICD-10-CM | POA: Diagnosis not present

## 2023-09-30 MED ORDER — FLUOXETINE HCL 10 MG PO CAPS: ORAL_CAPSULE | ORAL | 2 refills | Status: AC

## 2023-09-30 MED ORDER — ALPRAZOLAM 0.25 MG PO TABS: ORAL_TABLET | ORAL | 1 refills | Status: DC

## 2023-09-30 NOTE — Progress Notes (Unsigned)
Judith Mcneil [id number] 22-Jun-1956 67 y.o.  Subjective:   Patient ID:  Judith Mcneil is a 67 y.o. (DOB 1956-12-13) female.  Chief Complaint: No chief complaint on file.   HPI JAALA NUTE presents to the office today for follow-up of ***  "I really have no complaints." Mood has been stable. Denies anxiety. She reports that she has not been sleeping well. She reports that she drifts off to sleep while watching the news and then wakes up wide awake and cannot return to sleep for another hour. Sleeping 6-7 hours total. Energy is good. Motivation is fair. Concentration is ok. Denies feeling "scattered." Denies SI.   Family is doing well. Daughter is "getting in a better place."   She is contemplating retirement.   Cat had renal issues and had to be put down. Her other cat now has thyroid issues.    Alprazolam was last filled 09/06/23.   Review of Systems:  Review of Systems  Gastrointestinal: Negative.   Musculoskeletal:  Negative for gait problem.  Neurological:  Negative for tremors and headaches.  Psychiatric/Behavioral:         Please refer to HPI    Medications: I have reviewed the patient's current medications.  Current Outpatient Medications  Medication Sig Dispense Refill   alendronate (FOSAMAX) 70 MG tablet TAKE 1 TABLET ONCE A WEEK WITH A FULL GLASS OF WATER ON AN EMPTY STOMACH 12 tablet 3   ALPRAZolam (XANAX) 0.25 MG tablet TAKE 1 TO 2 TABLETS AT BEDTIME AS NEEDED FOR INSOMNIA AND ANXIETY 135 tablet 1   Calcium Citrate-Vitamin D (CALCIUM CITRATE + D3 PO) Take by mouth.     FLUoxetine (PROZAC) 10 MG capsule Take 2 capsules (20 mg total) by mouth daily. (Patient taking differently: Take 10 mg by mouth daily. 1 per day) 180 capsule 2   ibuprofen (ADVIL,MOTRIN) 200 MG tablet Take 400 mg by mouth every 6 (six) hours as needed.     letrozole (FEMARA) 2.5 MG tablet Take 1 tablet (2.5 mg total) by mouth daily. 90 tablet 3   loratadine (CLARITIN) 10 MG tablet Take  10 mg by mouth daily.     Multiple Vitamin (MULTIVITAMIN) tablet Take 1 tablet by mouth daily.     Omega-3 Fatty Acids (FISH OIL PO) Take by mouth.     rosuvastatin (CRESTOR) 20 MG tablet Take by mouth. Taking 3 days a week     valACYclovir (VALTREX) 1000 MG tablet Take 1,000 mg by mouth daily as needed (outbreak).     No current facility-administered medications for this visit.    Medication Side Effects: Other: Possible sleep disturbance  Allergies:  Allergies  Allergen Reactions   Codeine Nausea Only    Past Medical History:  Diagnosis Date   Anxiety    Breast cancer (HCC)    Elevated cholesterol    Family history of breast cancer    Family history of colon cancer in father    Medical history non-contributory    Personal history of radiation therapy    Bilateral    PONV (postoperative nausea and vomiting)     Past Medical History, Surgical history, Social history, and Family history were reviewed and updated as appropriate.   Please see review of systems for further details on the patient's review from today.   Objective:   Physical Exam:  There were no vitals taken for this visit.  Physical Exam  Lab Review:     Component Value Date/Time   NA 139 06/25/2018  1231   K 4.4 06/25/2018 1231   CL 108 06/25/2018 1231   CO2 26 06/25/2018 1231   GLUCOSE 94 06/25/2018 1231   BUN 20 06/25/2018 1231   CREATININE 0.95 06/25/2018 1231   CALCIUM 9.2 06/25/2018 1231   PROT 7.0 06/25/2018 1231   ALBUMIN 3.9 06/25/2018 1231   AST 19 06/25/2018 1231   ALT 25 06/25/2018 1231   ALKPHOS 90 06/25/2018 1231   BILITOT 0.5 06/25/2018 1231   GFRNONAA >60 06/25/2018 1231   GFRAA >60 06/25/2018 1231       Component Value Date/Time   WBC 6.9 06/25/2018 1231   WBC 8.8 08/11/2013 1620   RBC 4.49 06/25/2018 1231   HGB 13.9 06/25/2018 1231   HCT 41.7 06/25/2018 1231   PLT 262 06/25/2018 1231   MCV 92.8 06/25/2018 1231   MCH 30.9 06/25/2018 1231   MCHC 33.3 06/25/2018 1231    RDW 13.5 06/25/2018 1231   LYMPHSABS 1.9 06/25/2018 1231   MONOABS 0.4 06/25/2018 1231   EOSABS 0.1 06/25/2018 1231   BASOSABS 0.0 06/25/2018 1231    No results found for: "POCLITH", "LITHIUM"   No results found for: "PHENYTOIN", "PHENOBARB", "VALPROATE", "CBMZ"   .res Assessment: Plan:   Move administration of Prozac in the morning.    There are no diagnoses linked to this encounter.   Please see After Visit Summary for patient specific instructions.  Future Appointments  Date Time Provider Department Center  09/30/2023  4:15 PM Corie Chiquito, PMHNP [facility] None  09/17/2024  8:45 AM Serena Croissant, MD [facility] None    No orders of the defined types were placed in this encounter.   -------------------------------

## 2023-10-01 ENCOUNTER — Other Ambulatory Visit: Payer: Self-pay | Admitting: Hematology and Oncology

## 2023-10-01 DIAGNOSIS — Z0000 Encounter for general adult medical examination without abnormal findings: Secondary | ICD-10-CM

## 2023-10-02 ENCOUNTER — Ambulatory Visit
Admission: RE | Admit: 2023-10-02 | Discharge: 2023-10-02 | Disposition: A | Discharge status: Home / Self Care | Payer: BC Managed Care – PPO | Source: Ambulatory Visit | Attending: Hematology and Oncology | Admitting: Hematology and Oncology

## 2023-10-02 DIAGNOSIS — Z1231 Encounter for screening mammogram for malignant neoplasm of breast: Secondary | ICD-10-CM | POA: Diagnosis not present

## 2023-10-02 DIAGNOSIS — Z0000 Encounter for general adult medical examination without abnormal findings: Secondary | ICD-10-CM

## 2023-10-08 ENCOUNTER — Other Ambulatory Visit: Payer: Self-pay

## 2023-10-08 DIAGNOSIS — F411 Generalized anxiety disorder: Secondary | ICD-10-CM

## 2023-10-23 ENCOUNTER — Ambulatory Visit: Payer: BC Managed Care – PPO

## 2023-10-24 ENCOUNTER — Ambulatory Visit: Payer: BC Managed Care – PPO

## 2023-11-06 ENCOUNTER — Encounter: Payer: Self-pay | Admitting: Psychiatry

## 2023-11-12 DIAGNOSIS — E663 Overweight: Secondary | ICD-10-CM | POA: Diagnosis not present

## 2023-11-12 DIAGNOSIS — Z23 Encounter for immunization: Secondary | ICD-10-CM | POA: Diagnosis not present

## 2023-11-12 DIAGNOSIS — E7800 Pure hypercholesterolemia, unspecified: Secondary | ICD-10-CM | POA: Diagnosis not present

## 2023-12-04 DIAGNOSIS — H40013 Open angle with borderline findings, low risk, bilateral: Secondary | ICD-10-CM | POA: Diagnosis not present

## 2024-02-11 DIAGNOSIS — E663 Overweight: Secondary | ICD-10-CM | POA: Diagnosis not present

## 2024-02-11 DIAGNOSIS — E7800 Pure hypercholesterolemia, unspecified: Secondary | ICD-10-CM | POA: Diagnosis not present

## 2024-02-18 DIAGNOSIS — Z01419 Encounter for gynecological examination (general) (routine) without abnormal findings: Secondary | ICD-10-CM | POA: Diagnosis not present

## 2024-03-30 ENCOUNTER — Ambulatory Visit: Payer: Medicare Other | Admitting: Psychiatry

## 2024-04-30 DIAGNOSIS — D124 Benign neoplasm of descending colon: Secondary | ICD-10-CM | POA: Diagnosis not present

## 2024-04-30 DIAGNOSIS — K5730 Diverticulosis of large intestine without perforation or abscess without bleeding: Secondary | ICD-10-CM | POA: Diagnosis not present

## 2024-04-30 DIAGNOSIS — K648 Other hemorrhoids: Secondary | ICD-10-CM | POA: Diagnosis not present

## 2024-04-30 DIAGNOSIS — Z09 Encounter for follow-up examination after completed treatment for conditions other than malignant neoplasm: Secondary | ICD-10-CM | POA: Diagnosis not present

## 2024-04-30 DIAGNOSIS — Z860101 Personal history of adenomatous and serrated colon polyps: Secondary | ICD-10-CM | POA: Diagnosis not present

## 2024-04-30 DIAGNOSIS — D122 Benign neoplasm of ascending colon: Secondary | ICD-10-CM | POA: Diagnosis not present

## 2024-04-30 DIAGNOSIS — Z800 Family history of malignant neoplasm of digestive organs: Secondary | ICD-10-CM | POA: Diagnosis not present

## 2024-05-21 DIAGNOSIS — E559 Vitamin D deficiency, unspecified: Secondary | ICD-10-CM | POA: Diagnosis not present

## 2024-05-21 DIAGNOSIS — F411 Generalized anxiety disorder: Secondary | ICD-10-CM | POA: Diagnosis not present

## 2024-05-21 DIAGNOSIS — E7800 Pure hypercholesterolemia, unspecified: Secondary | ICD-10-CM | POA: Diagnosis not present

## 2024-05-21 DIAGNOSIS — Z0000 Encounter for general adult medical examination without abnormal findings: Secondary | ICD-10-CM | POA: Diagnosis not present

## 2024-05-21 DIAGNOSIS — E663 Overweight: Secondary | ICD-10-CM | POA: Diagnosis not present

## 2024-05-21 DIAGNOSIS — Z6826 Body mass index (BMI) 26.0-26.9, adult: Secondary | ICD-10-CM | POA: Diagnosis not present

## 2024-07-29 ENCOUNTER — Other Ambulatory Visit: Payer: Self-pay | Admitting: Hematology and Oncology

## 2024-09-11 ENCOUNTER — Other Ambulatory Visit: Payer: Self-pay | Admitting: Hematology and Oncology

## 2024-09-11 DIAGNOSIS — Z1231 Encounter for screening mammogram for malignant neoplasm of breast: Secondary | ICD-10-CM

## 2024-09-17 ENCOUNTER — Inpatient Hospital Stay: Payer: BC Managed Care – PPO | Attending: Hematology and Oncology | Admitting: Hematology and Oncology

## 2024-09-17 VITALS — BP 124/71 | HR 91 | Temp 97.7°F | Resp 15 | Wt 154.8 lb

## 2024-09-17 DIAGNOSIS — M8588 Other specified disorders of bone density and structure, other site: Secondary | ICD-10-CM | POA: Diagnosis not present

## 2024-09-17 DIAGNOSIS — Z79811 Long term (current) use of aromatase inhibitors: Secondary | ICD-10-CM | POA: Diagnosis not present

## 2024-09-17 DIAGNOSIS — N951 Menopausal and female climacteric states: Secondary | ICD-10-CM | POA: Diagnosis not present

## 2024-09-17 DIAGNOSIS — Z780 Asymptomatic menopausal state: Secondary | ICD-10-CM

## 2024-09-17 DIAGNOSIS — Z923 Personal history of irradiation: Secondary | ICD-10-CM | POA: Insufficient documentation

## 2024-09-17 DIAGNOSIS — Z9221 Personal history of antineoplastic chemotherapy: Secondary | ICD-10-CM | POA: Insufficient documentation

## 2024-09-17 DIAGNOSIS — C50412 Malignant neoplasm of upper-outer quadrant of left female breast: Secondary | ICD-10-CM | POA: Insufficient documentation

## 2024-09-17 DIAGNOSIS — Z170 Estrogen receptor positive status [ER+]: Secondary | ICD-10-CM | POA: Diagnosis not present

## 2024-09-17 MED ORDER — LETROZOLE 2.5 MG PO TABS: 2.5000 mg | ORAL_TABLET | Freq: Every day | ORAL | 3 refills | Status: AC

## 2024-09-17 NOTE — Assessment & Plan Note (Signed)
 08/01/2018:Left lumpectomy: IDC grade 1, 1.2 cm, margins negative, 0/1 lymph node negative ER 94%, PR 90%, HER-2 negative ratio 1.09, Ki-67 3%, T1CN0 stage Ia;  Right lumpectomy: DCIS high-grade 1.4 cm, broadly less than 0.1 cm to posterior margin, ER 100%, PR 100%, Tis NX stage 0 Oncotype DX score 19: Low risk 6% distant recurrence risk at 9 years   Adjuvant radiation therapy 10/21/2018 to 11/17/2018   Treatment plan: Adjuvant antiestrogen therapy with letrozole  2.5 mg daily x7 years started 12/07/2018   Letrozole  toxicities: 1.  Hot flashes: Slowly improving   Breast cancer surveillance: 1.  Mammogram scheduled for 10/02/2024 2. breast exam 09/17/2024: Benign 3. Bone density 08/06/2023: Showed osteopenia T score -2 (improved from -2.1): Fosamax  along with exercise and calcium and vitamin D.    Return to clinic in 1 year for surveillance and follow-up.

## 2024-09-17 NOTE — Progress Notes (Signed)
 Patient Care Team: Cyrena Gwenn SQUIBB, MD (Inactive) as PCP - General (Family Medicine) Odean Potts, MD as Consulting Physician (Hematology and Oncology) Dewey Rush, MD as Consulting Physician (Radiation Oncology)  DIAGNOSIS:  Encounter Diagnosis  Name Primary?   Malignant neoplasm of upper-outer quadrant of left breast in female, estrogen receptor positive (HCC) Yes    SUMMARY OF ONCOLOGIC HISTORY: Oncology History  Malignant neoplasm of upper-outer quadrant of left breast in female, estrogen receptor positive (HCC)  06/19/2018 Initial Diagnosis   Screening detected left breast mass and distortion on further evaluation of mass went away and the distortion did not have a sonographic correlate.  Stereotactic biopsy revealed grade 2 IDC ER 95%, PR 90%, Ki-67 3%, HER-2 negative ratio 1.09 along with LCIS TX N0 stage Ia clinical stage    Genetic Testing   Negative genetic testing on the [facility] Breast Cancer STAT panel and Common Hereditary Cancers Panel. The STAT Breast cancer panel offered by [facility] includes sequencing and rearrangement analysis for the following 9 genes:  ATM, BRCA1, BRCA2, CDH1, CHEK2, PALB2, PTEN, STK11 and TP53.  The Common Hereditary Cancer Panel offered by [facility] includes sequencing and/or deletion duplication testing of the following 47 genes: APC, ATM, AXIN2, BARD1, BMPR1A, BRCA1, BRCA2, BRIP1, CDH1, CDKN2A (p14ARF), CDKN2A (p16INK4a), CKD4, CHEK2, CTNNA1, DICER1, EPCAM (Deletion/duplication testing only), GREM1 (promoter region deletion/duplication testing only), KIT, MEN1, MLH1, MSH2, MSH3, MSH6, MUTYH, NBN, NF1, NHTL1, PALB2, PDGFRA, PMS2, POLD1, POLE, PTEN, RAD50, RAD51C, RAD51D, SDHB, SDHC, SDHD, SMAD4, SMARCA4. STK11, TP53, TSC1, TSC2, and VHL.  The following genes were evaluated for sequence changes only: SDHA and HOXB13 c.251G>A variant only.  The report date is 07/21/2018.   08/01/2018 Surgery   Left lumpectomy: IDC grade 1, 1.2 cm, margins negative, 0/1 lymph  node negative ER 94%, PR 90%, HER-2 negative ratio 1.09, Ki-67 3%, T1CN0 stage Ia; Right lumpectomy: DCIS high-grade 1.4 cm, broadly less than 0.1 cm to posterior margin, ER 100%, PR 100%, Tis NX stage 0   08/08/2018 Oncotype testing   Oncotype DX score 19: Low risk 6% distant recurrence risk at 9 years   08/27/2018 Cancer Staging   Staging form: Breast, AJCC 8th Edition - Pathologic: Stage IA (pT1c, pN0, cM0, G1, ER+, PR+, HER2-, Oncotype DX score: 19) - Signed by Crawford Morna Pickle, NP on 08/27/2018   10/21/2018 - 11/17/2018 Radiation Therapy   Adjuvant radiation therapy   01/24/2019 -  Anti-estrogen oral therapy   Letrozole  daily     CHIEF COMPLIANT: Follow-up on letrozole  therapy  HISTORY OF PRESENT ILLNESS:   History of Present Illness [name] is a 68 year old female with breast cancer who presents with concerns about hot flashes and bone health. Switching letrozole  intake from night to morning improved symptoms.  She has osteopenia with a T-score of minus two as of August 2024. She has been on Fosamax  for about three years to manage bone density. Bone loss has been noted in her hips and radius over the past five years.     ALLERGIES:  is allergic to codeine.  MEDICATIONS:  Current Outpatient Medications  Medication Sig Dispense Refill   alendronate  (FOSAMAX ) 70 MG tablet TAKE 1 TABLET ONCE A WEEK WITH A FULL GLASS OF WATER ON AN EMPTY STOMACH 12 tablet 3   ALPRAZolam  (XANAX ) 0.25 MG tablet TAKE 1 TO 2 TABLETS AT BEDTIME AS NEEDED FOR INSOMNIA AND ANXIETY 135 tablet 1   Calcium Citrate-Vitamin D (CALCIUM CITRATE + D3 PO) Take by mouth.  FLUoxetine  (PROZAC ) 10 MG capsule Take 2 capsules every other day 180 capsule 2   ibuprofen (ADVIL,MOTRIN) 200 MG tablet Take 400 mg by mouth every 6 (six) hours as needed.     letrozole  (FEMARA ) 2.5 MG tablet Take 1 tablet (2.5 mg total) by mouth daily. 90 tablet 3   loratadine (CLARITIN) 10 MG tablet Take 10 mg by mouth  daily.     Multiple Vitamin (MULTIVITAMIN) tablet Take 1 tablet by mouth daily.     Omega-3 Fatty Acids (FISH OIL PO) Take by mouth.     rosuvastatin (CRESTOR) 20 MG tablet Take by mouth. Taking 3 days a week     valACYclovir (VALTREX) 1000 MG tablet Take 1,000 mg by mouth daily as needed (outbreak).     No current facility-administered medications for this visit.    PHYSICAL EXAMINATION: ECOG PERFORMANCE STATUS: 1 - Symptomatic but completely ambulatory  Vitals:   09/17/24 0833  BP: 124/71  Pulse: 91  Resp: 15  Temp: 97.7 F (36.5 C)  SpO2: 99%   Filed Weights   09/17/24 0833  Weight: 154 lb 12.8 oz (70.2 kg)    Physical Exam No palpable lumps nodules of bilateral breasts or axilla     (exam performed in the presence of a chaperone)  LABORATORY DATA:  I have reviewed the data as listed    Latest Ref Rng & Units 06/25/2018   12:31 PM  CMP  Glucose 70 - 99 mg/dL 94   BUN 8 - 23 mg/dL 20   Creatinine 9.55 - 1.00 mg/dL 9.04   Sodium 864 - 854 mmol/L 139   Potassium 3.5 - 5.1 mmol/L 4.4   Chloride 98 - 111 mmol/L 108   CO2 22 - 32 mmol/L 26   Calcium 8.9 - 10.3 mg/dL 9.2   Total Protein 6.5 - 8.1 g/dL 7.0   Total Bilirubin 0.3 - 1.2 mg/dL 0.5   Alkaline Phos 38 - 126 U/L 90   AST 15 - 41 U/L 19   ALT 0 - 44 U/L 25     Lab Results  Component Value Date   WBC 6.9 06/25/2018   HGB 13.9 06/25/2018   HCT 41.7 06/25/2018   MCV 92.8 06/25/2018   PLT 262 06/25/2018   NEUTROABS 4.4 06/25/2018    ASSESSMENT & PLAN:  Malignant neoplasm of upper-outer quadrant of left breast in female, estrogen receptor positive (HCC) 08/01/2018:Left lumpectomy: IDC grade 1, 1.2 cm, margins negative, 0/1 lymph node negative ER 94%, PR 90%, HER-2 negative ratio 1.09, Ki-67 3%, T1CN0 stage Ia;  Right lumpectomy: DCIS high-grade 1.4 cm, broadly less than 0.1 cm to posterior margin, ER 100%, PR 100%, Tis NX stage 0 Oncotype DX score 19: Low risk 6% distant recurrence risk at 9 years    Adjuvant radiation therapy 10/21/2018 to 11/17/2018   Treatment plan: Adjuvant antiestrogen therapy with letrozole  2.5 mg daily x7 years started 12/07/2018   Letrozole  toxicities: 1.  Hot flashes: Slowly improving   Breast cancer surveillance: 1.  Mammogram scheduled for 10/02/2024 2. breast exam 09/17/2024: Benign 3. Bone density 08/06/2023: Showed osteopenia T score -2 (improved from -2.1): Fosamax  along with exercise and calcium and vitamin D.  A new bone density will be ordered for 2026. 4.  Recommended guardant for MRD testing   Return to clinic in 1 year for surveillance and follow-up.  After that she could be seen on an as-needed basis     No orders of the defined types were placed in  this encounter.  The patient has a good understanding of the overall plan. she agrees with it. she will call with any problems that may develop before the next visit here. Total time spent: 30 mins including face to face time and time spent for planning, charting and co-ordination of care   Viinay K Kimyatta Lecy, MD 09/17/24

## 2024-10-02 ENCOUNTER — Ambulatory Visit
Admission: RE | Admit: 2024-10-02 | Discharge: 2024-10-02 | Disposition: A | Discharge status: Home / Self Care | Source: Ambulatory Visit | Attending: Hematology and Oncology | Admitting: Hematology and Oncology

## 2024-10-02 DIAGNOSIS — Z1231 Encounter for screening mammogram for malignant neoplasm of breast: Secondary | ICD-10-CM | POA: Diagnosis not present

## 2024-11-06 DIAGNOSIS — J019 Acute sinusitis, unspecified: Secondary | ICD-10-CM | POA: Diagnosis not present

## 2024-11-30 DIAGNOSIS — H40013 Open angle with borderline findings, low risk, bilateral: Secondary | ICD-10-CM | POA: Diagnosis not present

## 2025-04-20 ENCOUNTER — Other Ambulatory Visit (HOSPITAL_BASED_OUTPATIENT_CLINIC_OR_DEPARTMENT_OTHER)

## 2025-10-21 ENCOUNTER — Ambulatory Visit: Admitting: Hematology and Oncology
